# Patient Record
Sex: Female | Born: 1950
Health system: Southern US, Community
[De-identification: ages and names within clinical notes are randomized; demographics above are authoritative.]

## PROBLEM LIST (undated history)

## (undated) DIAGNOSIS — K219 Gastro-esophageal reflux disease without esophagitis: Secondary | ICD-10-CM

## (undated) DIAGNOSIS — K859 Acute pancreatitis without necrosis or infection, unspecified: Secondary | ICD-10-CM

## (undated) DIAGNOSIS — E119 Type 2 diabetes mellitus without complications: Secondary | ICD-10-CM

## (undated) DIAGNOSIS — M1990 Unspecified osteoarthritis, unspecified site: Secondary | ICD-10-CM

## (undated) DIAGNOSIS — E039 Hypothyroidism, unspecified: Secondary | ICD-10-CM

## (undated) DIAGNOSIS — E785 Hyperlipidemia, unspecified: Secondary | ICD-10-CM

## (undated) DIAGNOSIS — I10 Essential (primary) hypertension: Secondary | ICD-10-CM

## (undated) HISTORY — PX: KNEE SURGERY: SHX244

## (undated) HISTORY — DX: Hyperlipidemia, unspecified: E78.5

## (undated) HISTORY — PX: JOINT REPLACEMENT: SHX530

## (undated) HISTORY — DX: Type 2 diabetes mellitus without complications: E11.9

## (undated) HISTORY — PX: OTHER SURGICAL HISTORY: SHX169

## (undated) HISTORY — PX: THYROIDECTOMY: SHX17

## (undated) HISTORY — PX: ABDOMINAL HYSTERECTOMY: SHX81

---

## 1997-12-20 ENCOUNTER — Emergency Department (HOSPITAL_COMMUNITY): Admission: EM | Admit: 1997-12-20 | Discharge: 1997-12-20 | Payer: Self-pay | Admitting: Emergency Medicine

## 1998-05-26 ENCOUNTER — Encounter: Payer: Self-pay | Admitting: Specialist

## 1998-06-02 ENCOUNTER — Inpatient Hospital Stay (HOSPITAL_COMMUNITY): Admission: RE | Admit: 1998-06-02 | Discharge: 1998-06-06 | Payer: Self-pay | Admitting: Specialist

## 1998-06-08 ENCOUNTER — Encounter (HOSPITAL_COMMUNITY): Admission: RE | Admit: 1998-06-08 | Discharge: 1998-09-06 | Payer: Self-pay | Admitting: Specialist

## 1998-10-04 ENCOUNTER — Inpatient Hospital Stay (HOSPITAL_COMMUNITY): Admission: AD | Admit: 1998-10-04 | Discharge: 1998-10-07 | Payer: Self-pay | Admitting: Sports Medicine

## 1998-10-10 ENCOUNTER — Encounter: Admission: RE | Admit: 1998-10-10 | Discharge: 1998-10-10 | Payer: Self-pay | Admitting: Sports Medicine

## 1998-12-04 ENCOUNTER — Encounter: Admission: RE | Admit: 1998-12-04 | Discharge: 1998-12-04 | Payer: Self-pay | Admitting: Family Medicine

## 1999-02-13 ENCOUNTER — Emergency Department (HOSPITAL_COMMUNITY): Admission: EM | Admit: 1999-02-13 | Discharge: 1999-02-13 | Payer: Self-pay | Admitting: Emergency Medicine

## 1999-02-13 ENCOUNTER — Encounter: Payer: Self-pay | Admitting: Emergency Medicine

## 1999-04-15 ENCOUNTER — Emergency Department (HOSPITAL_COMMUNITY): Admission: EM | Admit: 1999-04-15 | Discharge: 1999-04-15 | Payer: Self-pay | Admitting: *Deleted

## 1999-04-24 ENCOUNTER — Ambulatory Visit (HOSPITAL_COMMUNITY): Admission: RE | Admit: 1999-04-24 | Discharge: 1999-04-24 | Payer: Self-pay | Admitting: Specialist

## 1999-04-24 ENCOUNTER — Encounter: Payer: Self-pay | Admitting: Specialist

## 1999-05-10 ENCOUNTER — Ambulatory Visit (HOSPITAL_COMMUNITY): Admission: RE | Admit: 1999-05-10 | Discharge: 1999-05-10 | Payer: Self-pay | Admitting: Orthopedic Surgery

## 1999-05-10 ENCOUNTER — Encounter: Payer: Self-pay | Admitting: Orthopedic Surgery

## 1999-06-12 ENCOUNTER — Ambulatory Visit (HOSPITAL_COMMUNITY): Admission: RE | Admit: 1999-06-12 | Discharge: 1999-06-12 | Payer: Self-pay | Admitting: Orthopedic Surgery

## 1999-06-12 ENCOUNTER — Encounter: Payer: Self-pay | Admitting: Orthopedic Surgery

## 1999-06-29 ENCOUNTER — Emergency Department (HOSPITAL_COMMUNITY): Admission: EM | Admit: 1999-06-29 | Discharge: 1999-06-29 | Payer: Self-pay | Admitting: Emergency Medicine

## 1999-06-29 ENCOUNTER — Encounter: Payer: Self-pay | Admitting: Emergency Medicine

## 1999-07-03 ENCOUNTER — Encounter: Payer: Self-pay | Admitting: Orthopedic Surgery

## 1999-07-03 ENCOUNTER — Other Ambulatory Visit: Admission: RE | Admit: 1999-07-03 | Discharge: 1999-07-03 | Payer: Self-pay | Admitting: Obstetrics and Gynecology

## 1999-07-03 ENCOUNTER — Ambulatory Visit (HOSPITAL_COMMUNITY): Admission: RE | Admit: 1999-07-03 | Discharge: 1999-07-03 | Payer: Self-pay | Admitting: Orthopedic Surgery

## 2000-04-10 ENCOUNTER — Encounter: Payer: Self-pay | Admitting: Emergency Medicine

## 2000-04-10 ENCOUNTER — Emergency Department (HOSPITAL_COMMUNITY): Admission: EM | Admit: 2000-04-10 | Discharge: 2000-04-10 | Payer: Self-pay | Admitting: Emergency Medicine

## 2000-05-26 ENCOUNTER — Emergency Department (HOSPITAL_COMMUNITY): Admission: EM | Admit: 2000-05-26 | Discharge: 2000-05-26 | Payer: Self-pay | Admitting: Internal Medicine

## 2000-06-27 ENCOUNTER — Emergency Department (HOSPITAL_COMMUNITY): Admission: EM | Admit: 2000-06-27 | Discharge: 2000-06-27 | Payer: Self-pay | Admitting: Emergency Medicine

## 2000-07-12 ENCOUNTER — Emergency Department (HOSPITAL_COMMUNITY): Admission: EM | Admit: 2000-07-12 | Discharge: 2000-07-12 | Payer: Self-pay | Admitting: Internal Medicine

## 2000-08-28 ENCOUNTER — Emergency Department (HOSPITAL_COMMUNITY): Admission: EM | Admit: 2000-08-28 | Discharge: 2000-08-28 | Payer: Self-pay | Admitting: Emergency Medicine

## 2000-08-28 ENCOUNTER — Encounter: Payer: Self-pay | Admitting: Emergency Medicine

## 2000-09-05 ENCOUNTER — Encounter: Payer: Self-pay | Admitting: Specialist

## 2000-09-05 ENCOUNTER — Ambulatory Visit (HOSPITAL_COMMUNITY): Admission: RE | Admit: 2000-09-05 | Discharge: 2000-09-05 | Payer: Self-pay | Admitting: Specialist

## 2000-11-18 ENCOUNTER — Emergency Department (HOSPITAL_COMMUNITY): Admission: EM | Admit: 2000-11-18 | Discharge: 2000-11-19 | Payer: Self-pay | Admitting: Emergency Medicine

## 2001-02-11 ENCOUNTER — Encounter: Payer: Self-pay | Admitting: Specialist

## 2001-02-19 ENCOUNTER — Inpatient Hospital Stay (HOSPITAL_COMMUNITY): Admission: RE | Admit: 2001-02-19 | Discharge: 2001-02-22 | Payer: Self-pay | Admitting: Specialist

## 2001-02-19 ENCOUNTER — Encounter (INDEPENDENT_AMBULATORY_CARE_PROVIDER_SITE_OTHER): Payer: Self-pay | Admitting: *Deleted

## 2001-05-07 ENCOUNTER — Encounter: Admission: RE | Admit: 2001-05-07 | Discharge: 2001-05-07 | Payer: Self-pay | Admitting: Family Medicine

## 2001-07-20 ENCOUNTER — Encounter: Payer: Self-pay | Admitting: Emergency Medicine

## 2001-07-20 ENCOUNTER — Emergency Department (HOSPITAL_COMMUNITY): Admission: EM | Admit: 2001-07-20 | Discharge: 2001-07-20 | Payer: Self-pay | Admitting: Emergency Medicine

## 2001-07-22 ENCOUNTER — Emergency Department (HOSPITAL_COMMUNITY): Admission: EM | Admit: 2001-07-22 | Discharge: 2001-07-22 | Payer: Self-pay | Admitting: *Deleted

## 2001-10-01 ENCOUNTER — Encounter: Payer: Self-pay | Admitting: Specialist

## 2001-10-06 ENCOUNTER — Inpatient Hospital Stay (HOSPITAL_COMMUNITY): Admission: RE | Admit: 2001-10-06 | Discharge: 2001-10-09 | Payer: Self-pay | Admitting: Specialist

## 2001-10-06 ENCOUNTER — Encounter (INDEPENDENT_AMBULATORY_CARE_PROVIDER_SITE_OTHER): Payer: Self-pay | Admitting: *Deleted

## 2001-10-11 ENCOUNTER — Emergency Department (HOSPITAL_COMMUNITY): Admission: EM | Admit: 2001-10-11 | Discharge: 2001-10-11 | Payer: Self-pay | Admitting: Emergency Medicine

## 2001-11-05 ENCOUNTER — Inpatient Hospital Stay (HOSPITAL_COMMUNITY): Admission: RE | Admit: 2001-11-05 | Discharge: 2001-11-10 | Payer: Self-pay | Admitting: Specialist

## 2002-05-24 ENCOUNTER — Inpatient Hospital Stay (HOSPITAL_COMMUNITY): Admission: RE | Admit: 2002-05-24 | Discharge: 2002-05-27 | Payer: Self-pay | Admitting: Specialist

## 2002-06-08 ENCOUNTER — Encounter: Admission: RE | Admit: 2002-06-08 | Discharge: 2002-07-07 | Payer: Self-pay | Admitting: Specialist

## 2002-11-28 ENCOUNTER — Emergency Department (HOSPITAL_COMMUNITY): Admission: EM | Admit: 2002-11-28 | Discharge: 2002-11-28 | Payer: Self-pay | Admitting: *Deleted

## 2003-03-22 ENCOUNTER — Ambulatory Visit (HOSPITAL_COMMUNITY): Admission: RE | Admit: 2003-03-22 | Discharge: 2003-03-22 | Payer: Self-pay | Admitting: Internal Medicine

## 2003-05-15 ENCOUNTER — Emergency Department (HOSPITAL_COMMUNITY): Admission: AD | Admit: 2003-05-15 | Discharge: 2003-05-15 | Payer: Self-pay | Admitting: Family Medicine

## 2003-05-15 ENCOUNTER — Encounter: Payer: Self-pay | Admitting: Family Medicine

## 2003-08-26 ENCOUNTER — Other Ambulatory Visit: Admission: RE | Admit: 2003-08-26 | Discharge: 2003-08-26 | Payer: Self-pay | Admitting: Internal Medicine

## 2003-10-10 ENCOUNTER — Emergency Department (HOSPITAL_COMMUNITY): Admission: EM | Admit: 2003-10-10 | Discharge: 2003-10-10 | Payer: Self-pay | Admitting: Family Medicine

## 2003-12-11 ENCOUNTER — Emergency Department (HOSPITAL_COMMUNITY): Admission: EM | Admit: 2003-12-11 | Discharge: 2003-12-11 | Payer: Self-pay | Admitting: Family Medicine

## 2003-12-15 ENCOUNTER — Encounter: Admission: RE | Admit: 2003-12-15 | Discharge: 2003-12-15 | Payer: Self-pay | Admitting: Internal Medicine

## 2003-12-19 ENCOUNTER — Other Ambulatory Visit: Admission: RE | Admit: 2003-12-19 | Discharge: 2003-12-19 | Payer: Self-pay | Admitting: Diagnostic Radiology

## 2004-02-07 ENCOUNTER — Encounter (HOSPITAL_COMMUNITY): Admission: RE | Admit: 2004-02-07 | Discharge: 2004-05-07 | Payer: Self-pay | Admitting: Internal Medicine

## 2004-11-21 ENCOUNTER — Encounter (INDEPENDENT_AMBULATORY_CARE_PROVIDER_SITE_OTHER): Payer: Self-pay | Admitting: Specialist

## 2004-11-22 ENCOUNTER — Inpatient Hospital Stay (HOSPITAL_COMMUNITY): Admission: RE | Admit: 2004-11-22 | Discharge: 2004-11-23 | Payer: Self-pay | Admitting: General Surgery

## 2005-02-19 ENCOUNTER — Encounter: Admission: RE | Admit: 2005-02-19 | Discharge: 2005-02-19 | Payer: Self-pay | Admitting: Orthopedic Surgery

## 2005-05-24 ENCOUNTER — Encounter: Admission: RE | Admit: 2005-05-24 | Discharge: 2005-05-24 | Payer: Self-pay | Admitting: Internal Medicine

## 2005-05-31 ENCOUNTER — Encounter: Admission: RE | Admit: 2005-05-31 | Discharge: 2005-05-31 | Payer: Self-pay | Admitting: Internal Medicine

## 2005-09-14 ENCOUNTER — Emergency Department (HOSPITAL_COMMUNITY): Admission: EM | Admit: 2005-09-14 | Discharge: 2005-09-14 | Payer: Self-pay | Admitting: Emergency Medicine

## 2005-11-27 ENCOUNTER — Encounter: Admission: RE | Admit: 2005-11-27 | Discharge: 2005-11-27 | Payer: Self-pay | Admitting: Orthopedic Surgery

## 2006-03-05 ENCOUNTER — Inpatient Hospital Stay (HOSPITAL_COMMUNITY): Admission: RE | Admit: 2006-03-05 | Discharge: 2006-03-08 | Payer: Self-pay | Admitting: Orthopedic Surgery

## 2006-05-05 ENCOUNTER — Encounter: Admission: RE | Admit: 2006-05-05 | Discharge: 2006-05-05 | Payer: Self-pay | Admitting: Internal Medicine

## 2006-05-10 ENCOUNTER — Emergency Department (HOSPITAL_COMMUNITY): Admission: EM | Admit: 2006-05-10 | Discharge: 2006-05-10 | Payer: Self-pay | Admitting: Family Medicine

## 2006-09-19 ENCOUNTER — Encounter: Admission: RE | Admit: 2006-09-19 | Discharge: 2006-09-19 | Payer: Self-pay | Admitting: Geriatric Medicine

## 2006-10-12 ENCOUNTER — Encounter: Admission: RE | Admit: 2006-10-12 | Discharge: 2006-10-12 | Payer: Self-pay | Admitting: Internal Medicine

## 2006-10-23 ENCOUNTER — Ambulatory Visit (HOSPITAL_COMMUNITY): Admission: RE | Admit: 2006-10-23 | Discharge: 2006-10-23 | Payer: Self-pay | Admitting: Gastroenterology

## 2006-12-26 ENCOUNTER — Encounter: Admission: RE | Admit: 2006-12-26 | Discharge: 2006-12-26 | Payer: Self-pay | Admitting: Gastroenterology

## 2007-08-06 HISTORY — PX: CHOLECYSTECTOMY: SHX55

## 2007-08-19 ENCOUNTER — Encounter
Admission: RE | Admit: 2007-08-19 | Discharge: 2007-11-17 | Payer: Self-pay | Admitting: Physical Medicine & Rehabilitation

## 2007-08-19 ENCOUNTER — Ambulatory Visit: Payer: Self-pay | Admitting: Physical Medicine & Rehabilitation

## 2007-08-27 ENCOUNTER — Encounter
Admission: RE | Admit: 2007-08-27 | Discharge: 2007-10-08 | Payer: Self-pay | Admitting: Physical Medicine & Rehabilitation

## 2007-09-05 ENCOUNTER — Encounter (INDEPENDENT_AMBULATORY_CARE_PROVIDER_SITE_OTHER): Payer: Self-pay | Admitting: Surgery

## 2007-09-05 ENCOUNTER — Inpatient Hospital Stay (HOSPITAL_COMMUNITY): Admission: EM | Admit: 2007-09-05 | Discharge: 2007-09-06 | Payer: Self-pay | Admitting: Emergency Medicine

## 2007-09-07 ENCOUNTER — Encounter: Admission: RE | Admit: 2007-09-07 | Discharge: 2007-09-07 | Payer: Self-pay | Admitting: General Surgery

## 2007-09-08 ENCOUNTER — Encounter: Admission: RE | Admit: 2007-09-08 | Discharge: 2007-09-08 | Payer: Self-pay | Admitting: Gastroenterology

## 2007-09-11 ENCOUNTER — Ambulatory Visit (HOSPITAL_COMMUNITY): Admission: RE | Admit: 2007-09-11 | Discharge: 2007-09-11 | Payer: Self-pay | Admitting: Gastroenterology

## 2007-09-22 ENCOUNTER — Encounter: Admission: RE | Admit: 2007-09-22 | Discharge: 2007-09-22 | Payer: Self-pay | Admitting: Orthopedic Surgery

## 2007-10-13 ENCOUNTER — Ambulatory Visit: Payer: Self-pay | Admitting: Physical Medicine & Rehabilitation

## 2007-11-04 ENCOUNTER — Ambulatory Visit (HOSPITAL_BASED_OUTPATIENT_CLINIC_OR_DEPARTMENT_OTHER): Admission: RE | Admit: 2007-11-04 | Discharge: 2007-11-04 | Payer: Self-pay | Admitting: Orthopedic Surgery

## 2007-12-16 ENCOUNTER — Encounter
Admission: RE | Admit: 2007-12-16 | Discharge: 2008-03-15 | Payer: Self-pay | Admitting: Physical Medicine & Rehabilitation

## 2008-01-21 ENCOUNTER — Ambulatory Visit: Payer: Self-pay | Admitting: Physical Medicine & Rehabilitation

## 2008-03-03 ENCOUNTER — Ambulatory Visit: Payer: Self-pay | Admitting: Physical Medicine & Rehabilitation

## 2008-03-30 ENCOUNTER — Encounter
Admission: RE | Admit: 2008-03-30 | Discharge: 2008-06-28 | Payer: Self-pay | Admitting: Physical Medicine & Rehabilitation

## 2008-03-31 ENCOUNTER — Ambulatory Visit: Payer: Self-pay | Admitting: Physical Medicine & Rehabilitation

## 2008-05-02 ENCOUNTER — Ambulatory Visit: Payer: Self-pay | Admitting: Physical Medicine & Rehabilitation

## 2008-06-06 ENCOUNTER — Ambulatory Visit: Payer: Self-pay | Admitting: Physical Medicine & Rehabilitation

## 2008-06-14 ENCOUNTER — Encounter
Admission: RE | Admit: 2008-06-14 | Discharge: 2008-09-12 | Payer: Self-pay | Admitting: Physical Medicine & Rehabilitation

## 2008-07-04 ENCOUNTER — Encounter
Admission: RE | Admit: 2008-07-04 | Discharge: 2008-07-27 | Payer: Self-pay | Admitting: Physical Medicine & Rehabilitation

## 2008-07-05 ENCOUNTER — Ambulatory Visit: Payer: Self-pay | Admitting: Physical Medicine & Rehabilitation

## 2008-08-02 ENCOUNTER — Encounter
Admission: RE | Admit: 2008-08-02 | Discharge: 2008-10-31 | Payer: Self-pay | Admitting: Physical Medicine & Rehabilitation

## 2008-08-02 ENCOUNTER — Ambulatory Visit: Payer: Self-pay | Admitting: Physical Medicine & Rehabilitation

## 2008-08-31 ENCOUNTER — Ambulatory Visit: Payer: Self-pay | Admitting: Physical Medicine & Rehabilitation

## 2008-09-27 ENCOUNTER — Ambulatory Visit: Payer: Self-pay | Admitting: Physical Medicine & Rehabilitation

## 2008-10-14 ENCOUNTER — Encounter: Admission: RE | Admit: 2008-10-14 | Discharge: 2008-10-14 | Payer: Self-pay | Admitting: Internal Medicine

## 2008-10-26 ENCOUNTER — Ambulatory Visit: Payer: Self-pay | Admitting: Physical Medicine & Rehabilitation

## 2008-10-27 ENCOUNTER — Encounter: Admission: RE | Admit: 2008-10-27 | Discharge: 2008-10-27 | Payer: Self-pay | Admitting: Neurosurgery

## 2008-11-22 ENCOUNTER — Encounter
Admission: RE | Admit: 2008-11-22 | Discharge: 2009-02-20 | Payer: Self-pay | Admitting: Physical Medicine & Rehabilitation

## 2008-11-25 ENCOUNTER — Ambulatory Visit: Payer: Self-pay | Admitting: Physical Medicine & Rehabilitation

## 2008-12-20 ENCOUNTER — Ambulatory Visit: Payer: Self-pay | Admitting: Physical Medicine & Rehabilitation

## 2009-01-20 ENCOUNTER — Encounter: Admission: RE | Admit: 2009-01-20 | Discharge: 2009-01-20 | Payer: Self-pay | Admitting: Orthopedic Surgery

## 2009-02-01 ENCOUNTER — Ambulatory Visit: Payer: Self-pay | Admitting: Physical Medicine & Rehabilitation

## 2009-02-24 ENCOUNTER — Emergency Department (HOSPITAL_COMMUNITY): Admission: EM | Admit: 2009-02-24 | Discharge: 2009-02-24 | Payer: Self-pay | Admitting: Emergency Medicine

## 2009-03-01 ENCOUNTER — Encounter
Admission: RE | Admit: 2009-03-01 | Discharge: 2009-05-30 | Payer: Self-pay | Admitting: Physical Medicine & Rehabilitation

## 2009-03-02 ENCOUNTER — Ambulatory Visit: Payer: Self-pay | Admitting: Physical Medicine & Rehabilitation

## 2009-04-13 ENCOUNTER — Inpatient Hospital Stay (HOSPITAL_COMMUNITY): Admission: RE | Admit: 2009-04-13 | Discharge: 2009-04-15 | Payer: Self-pay | Admitting: Orthopedic Surgery

## 2009-06-07 ENCOUNTER — Encounter: Admission: RE | Admit: 2009-06-07 | Discharge: 2009-07-04 | Payer: Self-pay | Admitting: Orthopedic Surgery

## 2009-08-05 HISTORY — PX: BACK SURGERY: SHX140

## 2010-04-16 ENCOUNTER — Inpatient Hospital Stay (HOSPITAL_COMMUNITY): Admission: RE | Admit: 2010-04-16 | Discharge: 2010-04-19 | Payer: Self-pay | Admitting: Orthopedic Surgery

## 2010-08-25 ENCOUNTER — Encounter: Payer: Self-pay | Admitting: Orthopedic Surgery

## 2010-08-26 ENCOUNTER — Encounter: Payer: Self-pay | Admitting: Gastroenterology

## 2010-08-26 ENCOUNTER — Encounter: Payer: Self-pay | Admitting: Internal Medicine

## 2010-10-18 LAB — COMPREHENSIVE METABOLIC PANEL
ALT: 14 U/L (ref 0–35)
AST: 16 U/L (ref 0–37)
Albumin: 4.2 g/dL (ref 3.5–5.2)
Chloride: 101 mEq/L (ref 96–112)
Creatinine, Ser: 0.81 mg/dL (ref 0.4–1.2)
GFR calc Af Amer: >60 mL/min (ref >60)
Sodium: 139 mEq/L (ref 135–145)
Total Bilirubin: 0.4 mg/dL (ref 0.3–1.2)

## 2010-10-18 LAB — CBC
Hemoglobin: 8.8 g/dL — ABNORMAL LOW (ref 12.0–15.0)
Hemoglobin: 13.9 g/dL (ref 12.0–15.0)
Hemoglobin: 9.2 g/dL — ABNORMAL LOW (ref 12.0–15.0)
MCH: 29.2 pg (ref 26.0–34.0)
MCH: 29.1 pg (ref 26.0–34.0)
MCH: 29.2 pg (ref 26.0–34.0)
MCH: 29.2 pg (ref 26.0–34.0)
MCHC: 33.9 g/dL (ref 30.0–36.0)
MCHC: 34.1 g/dL (ref 30.0–36.0)
MCV: 85.8 fL (ref 78.0–100.0)
Platelets: 196 10*3/uL (ref 150–400)
Platelets: 216 10*3/uL (ref 150–400)
RBC: 3.02 MIL/uL — ABNORMAL LOW (ref 3.87–5.11)
RBC: 3.45 MIL/uL — ABNORMAL LOW (ref 3.87–5.11)
RBC: 4.76 MIL/uL (ref 3.87–5.11)
RDW: 15.6 % — ABNORMAL HIGH (ref 11.5–15.5)
WBC: 6.7 10*3/uL (ref 4.0–10.5)
WBC: 12 10*3/uL — ABNORMAL HIGH (ref 4.0–10.5)

## 2010-10-18 LAB — APTT: aPTT: 34 seconds (ref 24–37)

## 2010-10-18 LAB — URINALYSIS, ROUTINE W REFLEX MICROSCOPIC
Protein, ur: NEGATIVE mg/dL
Specific Gravity, Urine: 1.022 (ref 1.005–1.030)
Urobilinogen, UA: 0.2 mg/dL (ref 0.0–1.0)

## 2010-10-18 LAB — BASIC METABOLIC PANEL
BUN: 5 mg/dL — ABNORMAL LOW (ref 6–23)
CO2: 28 mEq/L (ref 19–32)
CO2: 30 mEq/L (ref 19–32)
Calcium: 7.6 mg/dL — ABNORMAL LOW (ref 8.4–10.5)
Chloride: 103 mEq/L (ref 96–112)
Creatinine, Ser: 0.65 mg/dL (ref 0.4–1.2)
Glucose, Bld: 116 mg/dL — ABNORMAL HIGH (ref 70–99)
Glucose, Bld: 125 mg/dL — ABNORMAL HIGH (ref 70–99)
Sodium: 136 mEq/L (ref 135–145)

## 2010-10-18 LAB — TYPE AND SCREEN: ABO/RH(D): A POS

## 2010-10-18 LAB — PROTIME-INR
INR: 1.86 — ABNORMAL HIGH (ref 0.00–1.49)
Prothrombin Time: 21.6 seconds — ABNORMAL HIGH (ref 11.6–15.2)
Prothrombin Time: 21 seconds — ABNORMAL HIGH (ref 11.6–15.2)

## 2010-10-18 LAB — SURGICAL PCR SCREEN: Staphylococcus aureus: POSITIVE — AB

## 2010-11-09 LAB — BASIC METABOLIC PANEL
BUN: 11 mg/dL (ref 6–23)
Chloride: 104 mEq/L (ref 96–112)
GFR calc non Af Amer: >60 mL/min (ref >60)
Glucose, Bld: 81 mg/dL (ref 70–99)
Potassium: 3.6 mEq/L (ref 3.5–5.1)
Sodium: 140 mEq/L (ref 135–145)

## 2010-11-09 LAB — TYPE AND SCREEN
ABO/RH(D): A POS
Donor AG Type: NEGATIVE

## 2010-11-09 LAB — CBC
HCT: 39.6 % (ref 36.0–46.0)
Hemoglobin: 13.3 g/dL (ref 12.0–15.0)
MCV: 85.9 fL (ref 78.0–100.0)
Platelets: 296 10*3/uL (ref 150–400)
RDW: 14.8 % (ref 11.5–15.5)
WBC: 7.9 10*3/uL (ref 4.0–10.5)

## 2010-12-18 NOTE — Assessment & Plan Note (Signed)
A 60 year old female with chronic postoperative pain following a right  total knee replacement.  She has been functioning at a reasonable level.  She has finished up with physical therapy.  She has been doing some knee  extension exercises.  She has had no falls.  She sees Dr. Lequita Halt  sometime in the next 4 months for recheck.  She does note some  increasing pain to the left knee.   She has some chronic back pain as well as lumbar spondylosis.  Her new  complaint is snapping pain in the right thumb.   Her pain level is graded at 7, but interferes with activity at a 6.  Her  Oswestry score is 46%.  She continues to drive.  She is independent with  all other self-care skills and ADLs.  Pain medication includes oxycodone  5/325 one p.o. t.i.d.  She has had no signs of abuse or aberrant drug  behavior.   Blood pressure is 109/74, pulse 87, respirations 18, and O2 100% on room  air.  A well-developed obese female in no acute distress.  Orientation  x3.  Affect alert.  Gait is with a limp favoring the right lower  extremity.  Extremities show no evidence of edema.   She has no evidence of effusion.  Wound is well healed surgical scar  midline right knee.   Deep tendon reflexes are mildly reduced, right knee compared to left,  equal at the ankles.   Her back has mild tenderness to palpation in the lumbar paraspinals.  She has no signs of scoliosis on inspection of the lumbar spine or  thoracic spine.   She has good hip internal and external rotation.  She has no tenderness  around the patella, but pain around the joint line right greater than  left side.   IMPRESSION:  1. Chronic postoperative knee pain, right lower extremity.  2. Left lower extremity increasing knee pain.  I suspect some      developing osteoarthritis.  3. Right thumb pain.  Examination of her hand showed no evidence of      joint swelling.  She has no tenderness to palpation over the      interphalangeal or  metacarpophalangeal.  She does have audible      snapping or clicking at the proximal interphalangeal joint of the      right hand with repeating some flexion/extension.  No      hypersensitivity to touch.  No numbness in the hand.   In terms of thumb pain, she does have snapping thumb with pain.  We will  ask her to pain at the interphalangeal joint.  No evidence of effusion.  We will ask her to see a Hand Surgery in regards to this.   We will continue her current pain medicines.  I will see her back in  about 3 months, nursing visits every month x2 in between.      Erick Colace, M.D.  Electronically Signed     AEK/MedQ  D:  09/27/2008 13:21:47  T:  09/28/2008 01:51:43  Job #:  [id number]   cc:   Ollen Gross, M.D.  Fax: [phone]   Madelynn Done, MD  Fax: [phone]

## 2010-12-18 NOTE — Assessment & Plan Note (Signed)
Tina Neal follows up today for chronic low back pain as well as  chronic right postoperative knee pain in the interval time.  She has in  the interval time period undergone bone scan of her lower extremities  demonstrating some increased uptake in the right knee femoral and tibial  components.  She is now scheduled for what sounds like an arthroscopic  surgery with Dr. Antony Odea.  She has had no new medical complications in the  interval time.  She also has right shoulder pain.  She has had previous  problems with this.  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   Her pain is rated 7/10 mainly in her low back as well as her right  greater than left knee area.  She does have some radiating pain further  into the right foot and ankle greater than the left.  Her sleep is fair.  She is not employed.   CURRENT PAIN MEDICATION:  Oxycodone 5/325 one p.o. t.i.d. or q.i.d.  She  had 60 prescribed three weeks ago and actually has 26 left.   Non-pain medications include Levoxyl, Nexium, Diazide, Singulair,  HyoMax.   EXAMINATION:  GENERAL:  No acute distress.  Mood and affect appropriate.  His blood pressure is 103/75, pulse 92, respirations 18.  O2 saturation  100% on room air.   Patient has crepitus right knee, no warmth, no effusion.  She has no  calf tenderness to palpation bilaterally, no lower extremity edema.  Her  upper strength is 5/5 deltoids, biceps, triceps, grip.  Lower extremity  strength is 5/5 hip flexor, knee extensor, ankle dorsiflexor.  She has  tenderness to palpation in the lumbar spine, which increases with  extension.  She has normal deep tendon reflexes in the lower extremities  with exception of right knee, which has 1+ reflex, some pain inhibition.  Her shoulder exam demonstrates positive impingement  sign at 90 degrees  of abduction, no pain with crossed adduction of the shoulder.  She has  no swelling in the shoulder, no tenderness to palpation in the  parascapular musculature.   IMPRESSION:  1. Lumbar facet syndrome.  She would benefit from medial branch      blocks, but will first undergo some knee arthroscopy, which will be      within the next month.  2. Right knee pain secondary to chronic postoperative pain.  Question      loosening versus infection.  Noted on nuclear medicine bone scan,      no signs of systemic infection or effusion of the knee at the      current time.  3. Right subacromial bursitis.  May benefit from repeat shoulder      injection.   PLAN:  I have reviewed her urine drug screen.  It is appropriate for the  medications repotted. No illicit drugs noted.  We will write Percocet  5/325 one p.o. t.i.d., and this is a one month's supply.  She can fill  this next week since she still has some medications left.  In the  postoperative period, Dr. Antony Odea can prescribe whatever postoperative  medications are needed.  He may need to go up to Percocet 7.5 in the  postoperative period for about a month.  I can  resume her pain management one month postoperatively should everything  go  well.  1. She may benefit from a trial of Voltaren gel to the knee.      Erick Colace, M.D.  Electronically Signed     AEK/MedQ  D:  10/13/2007 08:31:42  T:  10/13/2007 13:54:21  Job #:  [id number]   cc:   Candyce Churn, M.D.  Fax: [phone]   De Hollingshead, Dr.

## 2010-12-18 NOTE — Assessment & Plan Note (Signed)
Tina Neal is a 60 year old female.  She has a history of right total  knee replacement.  She has had chronic postoperative pain as a result.  She has been maintained on narcotic analgesics.  In addition, she has  low back pain, which has had relief with medial branch block in  September.  She really did not want to do a confirmatory block.  She  wanted to go through physical therapy and is starting to do this.  Physical Therapy is helping her right knee.  She feels more than her  back at this point.   Her Oswestry disability index score was 34%, which is improved compared  to 32% last visit.   PHYSICAL EXAMINATION:  GENERAL:  In no acute distress.  Mood and affect  appropriate.  She has good knee flexion and extension.  She has some  crepitus right knee and none on the left knee.  No evidence of knee  effusion bilaterally.  BACK:  Her back has some minor tenderness to palpation of paraspinals  bilaterally.  EXTREMITIES:  Her lower extremity strength is normal.  Gait does favor  the right lower extremity, otherwise no toe drag or knee instability.   IMPRESSION:  1. Chronic postoperative knee pain.  2. Left knee likely early osteoarthritis.  3. Lumbosacral disc disorder with chronic low back pain, may have some      facet syndrome as well.   PLAN:  1. We will go ahead and continue her PT.  2. I will see her back in 1 month, consider repeat medial branch      blocks to further necessity of radiofrequency neurotomy.  3. Continue oxycodone 5/325 t.i.d.  No signs of abuse or aberrant drug      behavior.      Erick Colace, M.D.  Electronically Signed     AEK/MedQ  D:  07/05/2008 09:33:43  T:  07/05/2008 21:47:45  Job #:  [id number]   cc:   Ollen Gross, M.D.  Fax: [phone]   Annitta Needs

## 2010-12-18 NOTE — H&P (Signed)
NAME:  Tina Neal, Tina Neal               ACCOUNT NO.:  [id number]   MEDICAL RECORD NO.:  [id number]          PATIENT TYPE:  INP   LOCATION:  5121                         FACILITY:  [facility]   PHYSICIAN:  Gabrielle Dare. Janee Morn, M.D.DATE OF BIRTH:  [birth year]   DATE OF ADMISSION:  09/04/2007  DATE OF DISCHARGE:                              HISTORY & PHYSICAL   CHIEF COMPLAINT:  Right upper quadrant abdominal pain.   HISTORY OF PRESENT ILLNESS:  The patient is a 60 year old African  American female with 24-hour history of right upper quadrant pain  extending through to her back.  She had some mild associated nausea  initially, but this has resolved.  The patient has had episodic attacks  over the past several months, and actually had an appointment coming up  this week with Dr. Charlott Rakes at [facility] for further evaluation.  She came to the emergency department today due to the pain.  An  ultrasound showed gallstones and Murphy's sign consistent with  cholecystitis.  She also has mildly dilated common bile duct.   PAST MEDICAL HISTORY:  1. Asthma.  2. Hypothyroidism.   PAST SURGICAL HISTORY:  1. Thyroidectomy by Dr. Derrell Lolling.  2. Cesarean section.  3. Partial hysterectomy.  4. Right knee replacement by Dr. Lequita Halt.  5. Right rotator cuff repair by Dr. Rennis Chris.   FAMILY HISTORY:  She denies.   SOCIAL HISTORY:  She smokes cigarettes.  She occasionally drinks  alcohol.  She is on disability due to her orthopedic problems.   ALLERGIES:  NO KNOWN DRUG ALLERGIES.   MEDICATIONS:  1. Azmacort 100 mcg 2 puffs b.i.d.  2. Combivent 2 puffs t.i.d.  3. Levoxyl 88 mcg daily.  4. Nexium 40 mg daily.  5. Triamterene with hydrochlorothiazide 37.5 with 25 mg daily.   REVIEW OF SYSTEMS:  GI:  As above.  GENERAL:  Negative.  CARDIAC:  Negative.  PULMONARY:  Negative, with no acute asthma symptoms recently.  GU: Negative.  NEUROPSYCHOLOGIC:  Negative.  Remainder of the review of  systems was  unremarkable.   PHYSICAL EXAMINATION:  Temperature 97, pulse 74, respirations 18, blood  pressure 124/83.  GENERAL:  She is awake and alert.  She is in no distress.  HEENT:  Pupils are equal.  Oral mucosa is moist.  Sclerae has no  significant icterus.  NECK:  Supple.  She has a scar anteriorly from her thyroid surgery.  PULMONARY EXAM:  Lungs are clear to auscultation.  There was no  significant wheezing present.  Respiratory effort is good.  CARDIOVASCULAR EXAM:  Heart is regular.  Normal S1 and S2 with no  murmurs heard, and pulses palpable in the left chest.  Distal pulses are  2+ throughout.  ABDOMEN:  Tender in right upper quadrant with no guarding.  No masses  are palpated.  No organomegaly is noted.  Bowel sounds are present.  She  has a lower midline scar from her previous hysterectomy and cesarean  section.  SKIN:  Warm and dry.  No rashes are present.  NEUROLOGIC:  She is awake and alert.  She is oriented  x3.  No gross  focal deficits were noted.  EXTREMITIES:  Have no deformity or  tenderness.   LABORATORY STUDIES:  White blood cell count 8.2, hemoglobin 12.3.  Basic  metabolic panel is within normal limits.  AST 137, ALT 79, alkaline  phosphatase 200, bilirubin 0.7.   IMPRESSION:  A 60 year old [place] American female with acute  cholecystitis   PLAN:  Will admit.  Will place her on IV antibiotics and we will plan  laparoscopic cholecystectomy with intraoperative cholangiogram later  today.  I will discuss this case in detail with Dr. Daphine Deutscher, who is my  partner working later today.  The plan will be discussed in detail with  the patient.      Gabrielle Dare Janee Morn, M.D.  Electronically Signed     BET/MEDQ  D:  09/05/2007  T:  09/05/2007  Job:  [id number]   cc:   Shirley Friar, MD

## 2010-12-18 NOTE — Op Note (Signed)
NAME:  Tina Neal, Tina Neal               ACCOUNT NO.:  [id number]   MEDICAL RECORD NO.:  [id number]          PATIENT TYPE:  AMB   LOCATION:  ENDO                         FACILITY:  [facility]   PHYSICIAN:  Petra Kuba, M.D.    DATE OF BIRTH:  May 26, 1951   DATE OF PROCEDURE:  09/11/2007  DATE OF DISCHARGE:                               OPERATIVE REPORT   PROCEDURE:  ERCP, sphincterotomy and balloon pull-through.   INDICATIONS:  Patient with probable CBD stone with persistent pain and  increased liver tests.  Consent was signed after risks, benefits,  methods, options were thoroughly discussed in the office after ERCP  video. She also did have an intraop cholangiogram with bubbles versus  stones.  Consent was signed as above.   MEDICINES USED:  Fentanyl 150 mcg, Versed 15 mg.   PROCEDURE:  A side-viewing therapeutic video duodenoscope was inserted  by indirect vision into the stomach and advanced by direct visualization  into the duodenum. A normal-appearing ampulla was brought into view.  Using the triple-lumen sphincterotome loaded with the Jag wire, deep  selective cannulation was obtained.  There were no PD injections nor any  wire advancements towards the pancreas.  The CBD was tapered possibly a  slight distal stricture and slightly dilated. Possibly we saw a small  stone on the initial cholangiogram. The wire was advanced into the  intrahepatics. A moderate-sized sphincterotomy was made in the customary  fashion until we had adequate biliary drainage and could get the fully  bowed sphincterotome easily in and out of the duct. We then exchanged  the sphincterotome for the adjustable 12-15 mm balloon and proceeded  with three balloon pull throughs at 12 mm.  No stones, sludge or debris  was seen. There was very minimal resistance in withdrawing through the  ampulla. The wire actually fell out on all the balloon pull throughs but  were easily able to cannulate using the balloon catheter.  At this  junction, we went ahead and proceeded with an occlusion cholangiogram  which was normal. It was done in the customary fashion and the balloon  was withdrawn through the ampulla.  There was some sluggish drainage but  she did have some probably due to edema and spasm.  We elected to stop  the procedure in this junction.  The scope was removed.  The patient  tolerated the procedure well.  There was no obvious immediate  complication.   ENDOSCOPIC DIAGNOSES:  1. Normal ampulla.  2. No pancreatic duct injections or wire advancements towards the      pancreas.  3. Slight dilated common bile duct with a tapered distal duct,      questionable one small stones seen on initial cholangiogram.  4. Status post moderate-sized sphincterotomy and three 12 mm balloon      pull throughs without much resistance.  5. Negative occlusion cholangiogram at the end of the procedure.  6. Some sluggish drainage at the end of the procedure probably due to      spasm and edema.   PLAN:  Observe for delayed complications.  Follow-up p.r.n.  or in 1-2  weeks in the office to recheck symptoms, liver tests and make sure no  further workup plans are needed. In the  meantime, no aspirin or  nonsteroidals for 2 weeks.           ______________________________  Petra Kuba, M.D.     MEM/MEDQ  D:  09/11/2007  T:  09/12/2007  Job:  [id number]   cc:   Candyce Churn, M.D.  Fax: [phone]   Shirley Friar, MD  Fax: [phone]   [place]. Daphine Deutscher, MD  [address]  [place]  [place] [address]

## 2010-12-18 NOTE — Procedures (Signed)
NAME:  INNOCENCE, SCHLOTZHAUER               ACCOUNT NO.:  [id number]   MEDICAL RECORD NO.:  [id number]          PATIENT TYPE:  REC   LOCATION:  TPC                          FACILITY:  [facility]   PHYSICIAN:  Erick Colace, M.D.DATE OF BIRTH:  [birth year]   DATE OF PROCEDURE:  06/06/2008  DATE OF DISCHARGE:                               OPERATIVE REPORT   Ms. Masi follows up today.  She underwent a right L5 dorsal ramus  injection, right L4 medial branch block, right L3 medial branch block  under fluoroscopic guidance with improvements of preinjection pain level  of 7/10 to postinjection pain level 3/10.  This persisted for a day or 2  and then her pain seemed to intensify once again.  She gives her pain  about a 6/10.  Currently, she has started taking her oxycodone more  frequently for while and now has had to cut down to make her 57-month  supply last twice a day.  Her pain prior to the procedure was 7/10 last  visit.  She has not undergone any physical therapy recently.  Her sleep  is fair.  Her pain increases with bending and sitting.  Her relief from  meds is fair.  She does not climb steps but she does drive.  She does  have orthopedic issues in regards to her knee.  In addition, she has had  a left shoulder injection by Dr. Rennis Chris.   PAST MEDICAL HISTORY:  Hyperthyroidism, and high blood pressure.   HABITS:  Include smoking a pack a day.   FAMILY HISTORY:  Heart disease, diabetes, and high blood pressure.   PHYSICAL EXAMINATION:  VITAL SIGNS:  Blood pressure 100/68, pulse 71,  respirations 18, O2 sat 97% on room air.  GENERAL:  Overweight female in no acute distress, orientation x3, affect  is alert.  Gait is normal.  EXTREMITIES:  Without edema.  She has tenderness to palpation bilateral  lumbar paraspinal.  She has pain with forward flexion as well as  hyperextension; however hyperextension is more limited at 0-25% of  normal range were the forward flexion is 50% range.   Her lower extremity strength is normal.  Deep tendon reflexes are normal  in lower extremity.  Lower extremity range of motion normal.   IMPRESSION:  Lumbosacral spondylosis without myelopathy.  She has  history of lumbosacral disk disorder as well.  She has had reduction of  pain following medial branch blocks times 1.  She was originally  scheduled to reblock today to confirm results; however, she would like  to try something else prior to having another injection.   PLAN:  1. We will go ahead and start some physical therapy fitness focus      program up.  Avoid hyperextension, 4 visits total followed by a      home exercise program.  2. Continue oxycodone 5/325 t.i.d. basis #90 written for 1 month      supply.   Oswestry disability index is stable today 52% versus 57% prior visit.  I  will see her back in 1 month.  Erick Colace, M.D.  Electronically Signed     AEK/MEDQ  D:  06/06/2008 08:51:47  T:  06/06/2008 11:44:53  Job:  [id number]   cc:   Vania Rea. Supple, M.D.  Fax: [phone]   Mila Homer. Sherlean Foot, M.D.  Fax: [phone]

## 2010-12-18 NOTE — Assessment & Plan Note (Signed)
HISTORY OF PRESENT ILLNESS:  Ms. Seith comes in today.  She has been  scheduled for medial branch block; however, she has been on Aleve on a  b.i.d. basis, and given the potential effect of some mild  anticoagulation, I informed her that we need to reschedule on this.  She  does give interval history of undergoing removal of scar tissue from the  right knee per Dr. Lequita Halt.  She had this surgery in April.  Dr. Lequita Halt  has been writing for her postoperative pain medications, which I have  been aware of.  I last saw the patient on October 13, 2007.  She has had  no postoperative complications.  She has had continued pain in her back  and bilateral lower extremities.  She also has some left shoulder pain.  She rates her pain as about an 8/10, interfering with activity at a 4/10  level.   PHYSICAL EXAMINATION:  In general, in no acute distress.  Mood and  affect appropriate.  Her back has tenderness in the lumbar paraspinals.  She has pain with extension greater than with flexion.  Her lower  extremity strength is normal in the hip flexion, knee extension, and  ankle dorsiflexion.  Her gait shows no evidence of toe drag or knee  instability.  Her mood and affect are appropriate.   IMPRESSION:  Lumbar axial pain, does have some lower extremity pain too,  I believe that it is multifactorial, and right side certainly still has  some postoperative pain.  She does have lumbar degenerative disk, but I  am more suspicious that she really has a facet syndrome and lumbar  spondylosis as the cause of her pain, given her extensor pain.   PLAN:  1. We will recheck urine drug screen, and if this checks out just      showing some oxycodone, I can resume her narcotic analgesic      medication prescription.  2. We will have to hold her Aleve for about a week prior to      rescheduled medial branch block.   As I discussed with the patient, I will be handling her pain medications  from here on out until  further notice.      Erick Colace, M.D.  Electronically Signed     AEK/MedQ  D:  01/21/2008 11:43:33  T:  01/22/2008 00:53:06  Job #:  [id number]   cc:   Ollen Gross, M.D.  Fax: [phone]

## 2010-12-18 NOTE — Op Note (Signed)
NAME:  Tina Neal, Tina Neal               ACCOUNT NO.:  [id number]   MEDICAL RECORD NO.:  [id number]          PATIENT TYPE:  AMB   LOCATION:  [facility]                         FACILITY:  [facility]   PHYSICIAN:  Ollen Gross, M.D.    DATE OF BIRTH:  [birth year]   DATE OF PROCEDURE:  11/04/2007  DATE OF DISCHARGE:                               OPERATIVE REPORT   PREOPERATIVE DIAGNOSIS:  Patellar clunk syndrome, right knee.   POSTOPERATIVE DIAGNOSIS:  Patellar clunk syndrome, right knee.   PROCEDURE:  Right knee arthroscopy with synovectomy.   SURGEON:  Ollen Gross, M.D.   ASSISTANT:  None.   ANESTHESIA:  General.   ESTIMATED BLOOD LOSS:  Minimal.   DRAINS:  None.   COMPLICATIONS:  None.   CONDITION:  Stable to recovery.   BRIEF CLINICAL NOTE:  [name] is a 60 year old female who has had multiple  prior knee surgeries, including knee replacement and revision.  She was  doing fairly well and then has had progressive pain and popping in the  knee.  This is all consistent with the patellar clunk syndrome.  She  presents now for arthroscopy and synovectomy.   PROCEDURE IN DETAIL:  After successful administration of general  anesthetic, a tourniquet is placed high on her right thigh, and right  lower extremity prepped and draped in the usual sterile fashion.  A  standard superior medial and inferolateral incision is made.  The inflow  cannula is passed.  Superior medial camera passed inferolateral.  Arthroscopic visualization proceeds.  She does have a fair amount of  hypertrophic synovitis but not an angry, red inflamed synovitis.  There  is a nodule right around the junction of the patella and quadriceps  tendon.  I started to debride this with a shaver from the inferior  medial portal and then also created a superolateral portal in order to  get the extent of it.  We then also used the Arthricare device to finish  the debridement and get it back to stable edges.  I inspected the entire  joint, so no signs of any foreign bodies within the joint and no  evidence of any prosthetic loosening.  The medial and lateral gutters  were also cleared of any hypertrophic tissue with the ArthroCare device.  I again inspected the joint and felt that the tissue was adequately  resected.  The arthroscopic equipment was then  removed from the inferior portals and superior lateral portal, which  were closed with interrupted 4-0 nylon.  Marcaine 0.25% 20 cc with epi  injected through the inflow cannula, then that is removed and that  portal closed with nylon.  A bulky sterile dressing is applied.  She is  awakened and transported to recovery in stable condition.      Ollen Gross, M.D.  Electronically Signed     FA/MEDQ  D:  11/04/2007  T:  11/04/2007  Job:  [id number]

## 2010-12-18 NOTE — Consult Note (Signed)
HISTORY:  Consult requested for evaluation of back pain, right lower  extremity pain, knee pain.   CHIEF COMPLAINT:  Back pain, leg pain, knee pain, as well as right  greater than left shoulder pain and some hand pain.   The patient is a 60 year old female who had slipped at work in 1999 and  had a knee injury, which was treated at [facility] by Dr.  Ronnell Guadalajara prior to his retirement.  She had several knee surgeries,  including right total knee, which was prior to 2003.  She had right  total knee revision November 05, 2001.  Had another right total knee  revision March 05, 2006.  She did have an infection and had an  antibiotic spacer placed at one point as well.  In regard to her back,  she had been seen on several occasions by Dr. Ethelene Hal, who reportedly per  patient performed several lumbar spine injections, although I do not  have all the records.  She had been recommended for spinal cord  stimulator and underwent evaluation and then since failed for this.  She  states she had 2 stim trials and she thinks it really was not helpful.  She last saw Dr. Lequita Halt several months ago and he felt her knee pain  was largely related to her back.   In terms of her pain, she has been using Percocet prescribed for b.i.d.,  she sometimes takes 2 at a time.   OTHER PAST MEDICAL HISTORY:  1. Subacromial bursitis, treated as recently as November 2008 by      orthopedics on the right side.  2. The other past history includes treatment for a closed ankle      fracture per Dr. Lestine Box.  3. She did have right rotator cuff surgery.   PAST SURGICAL HISTORY:  In addition to above has had the right rotator  cuff surgery in 2005.   OTHER MEDICATIONS TRIED:  1. Neurontin made her feel funny.  2. Celebrex helped.  She was afraid to take it due to some safety      concerns a few years ago.  3. She has tried Duragesic patch which was used for awhile, but not      any better than  Percocet.   IMAGING STUDIES:  Last MRI of the lumbar spine date October 12, 2006  showed a small broad-based bulging L3-4, mild facet hypertrophy, no  stenosis.  L4-5 disk bulges to the left.  Annular fissure laterally.  Slight progression compared to prior MRI dated February 19, 2005 and  potentially intact left L4 nerve root.  This is on the left.  On the  right she did have some facet hypertrophy at L5-S1, right greater than  left.   SOCIAL HISTORY:  The patient smokes a pack a day.  Denies any alcohol or  drug abuse.   FAMILY HISTORY:  Mother deceased at age 36 with asthma.  Father with  diabetes and hypertension.   EXAMINATION:  GENERAL:  No acute distress.  Mood and affect appropriate.  VITAL SIGNS:  Blood pressure 112/74, pulse 64, respirations 18, O2 sat  99% on room air.  NEUROLOGICAL:  Gait shows no evidence of toe dragging, or knee  instability. Slightly favors the right lower extremities. Affect is  bright and alert.  Orientation x3.  She is moderately obese.  NECK:  Range of motion is full.  MUSCULOSKELETAL:  Shoulder range of motion:  She has positive  impingement testing bilaterally at 90  degrees, although she can bring  her arms over her head.  Elbow, wrist, hand range of motion is normal.  Normal strength in bilateral deltoid, biceps, triceps.  Grip with pain  at free range.  She has normal sensation upper extremities.  Normal deep  tendon reflexes biceps, triceps, brachial radialis.  Lower extremity:  She has no tenderness to palpation in the lumbar paraspinals.  She has  pain with both forward flexion and extension.  She has approximately 50%  range in each of these areas.  Her lower extremity strength is 5/5  bilateral hip flexor, knee extensor, ankle dorsiflexor.  Knee range of  motion is full extension to 135 of flexion on the right knee, which is  equal to the left knee.  There is crepitus, right anterior knee greater  than left.  There is no peripheral edema in  the lower extremities.  Her  sensation is intact in bilateral lower extremities in the L3, 4, 5, S1  dermatomes, staying away from her surgical site on the right.  She has  multiple healed surgical scars in the right knee area.  Coordination is  intact.  Deep tendon reflexes normal in the lower extremities.  She also  has normal tone in the upper and lower extremities.  There is some mild  joint mediolateral instability right knee, otherwise intact.   IMPRESSION:  1. Chronic low back pain.  She has both axial and some radicular      symptoms, certainly some can be pseudoradicular and more related to      a facet problem.  She has no other neurologic signs that indicate      radiculopathy.  2. Chronic right postoperative knee pain.  She has had multiple      procedures in that area, however, she has good range of motion and      no particular pain with weightbearing.  Certainly some of the pain      can be related to the back and may be facet versus sacroiliac as      well.  I may consider EMG to further evaluate.   PLAN:  1. We will check urine drug screen.  2. Would consider changing Percocet to a long-acting medication if      need be.  3. Send to physical therapy for a lumbar stabilization program.  She      does not think she has ever had therapy directed toward her back      complaints.  4. Short-term Celebrex.  I do not think long-term she should be on      this, she has a history of gastritis.  Seen at [facility] in May.      Short-term use should not be a problem.  5. Consider lumbar medial branch blocks.  She does not appear to have      had these in the past.   Thank you for this interesting consultation.  The patient would like to  eventually be transitioned to an outpatient or a home exercise program  and will consider community based aquatic aerobics.      Erick Colace, M.D.  Electronically Signed     AEK/MedQ  D:08/20/2007 14:10:00  T:08/20/2007  15:27:03  Job #:  [id number]   cc:   Candyce Churn, M.D.  Fax: [phone]   Ollen Gross, M.D.  Fax: [phone]   Brooke Bonito  Fax: [phone]

## 2010-12-18 NOTE — Assessment & Plan Note (Signed)
This is a followup visit for chronic low back pain and chronic right  postoperative knee pain.  The patient seen by me in initial consultation  August 20, 2007.   Ms. Tina Neal follows up today.  She was seen by me in initial  consultation August 20, 2007.  The patient reported taking Percocet  August 19, 2007, and she was tested August 20, 2007, however, Percocet  or oxycodone did not show up.  I have asked her to give another sample  today.  We did make her follow up with physical therapy, and she did  follow through on this.  She has had no new medical complications in the  interval time period.  She was trialed on some Lidoderm patch.  She  returns today with pain still in the low back and in the right lower  extremity area.  Her sleep is fair.  Her pain is worse with walking and  standing.  She continues to smoke a half a pack per day.  In the  interval time period, she has undergone gallbladder surgery September 06, 2007.  She still has Percocet problems in the postoperative period of  time.  She takes 1 to 2 a day.  Is following up with her surgeon this  week.  Looking through her E-chart, she had an ERCP done on September 11, 2007, showing slightly dilated common bile duct and sphincterotomy.   PHYSICAL EXAMINATION:  BACK:  There is no tenderness to palpation.  Her  lumbar range of motion 50% forward flexion and extension, lateral  rotation and bending.   Sensation intact in all extremities.  Deep tendon reflexes normal in the  lower extremities.  Some medial lateral instability, right knee.  She  has good hip range of motion.   IMPRESSION:  Chronic low back pain, axial and radicular symptoms.  May  be more of a facet syndrome.  Would like to see what type of injection  she has had per Dr. Ethelene Hal.  The next recheck urine drug screen not  prescribing Percocet unless we actually see this in her urine.  Continue  physical therapy.      Erick Colace, M.D.  Electronically Signed     AEK/MedQ  D:  09/16/2007 13:06:54  T:  09/17/2007 15:51:31  Job #:  [id number]   cc:   Candyce Churn, M.D.  Fax: [phone]

## 2010-12-18 NOTE — Assessment & Plan Note (Signed)
Ms. Steffey follows up today.  I last saw her on February 15, 2008.  She has  a history of lumbar spondylosis without myelopathy.  She has a history  of chronic postoperative right knee pain.  She has a follow up with Dr.  Lequita Halt on this.  She continued on oxycodone for pain control 5/325 one  p.o. t.i.d., #90, was last written on March 03, 2008.  She states her  back pain has gotten worse, she would like to look into lumbar  injection.  We discussed medial branch blocks, she would like to have  this done as soon as she can.  Her average pain is 7/10 currently.  She  is independent with all her self-care and mobility.   REVIEW OF SYSTEMS:  Otherwise negative.  No bowel or bladder  dysfunction.   PHYSICAL EXAMINATION:  VITAL SIGNS:  Her blood pressure is 102/68, pulse  84, respirations 18, and O2 sats 100% on room air.  GENERAL:  In no acute distress.  Mood and affect appropriate.  Gait is  normal.  She has pain with extension of lumbar spine, really has no  extension.  She can flex forward to 75% normal range.  Her lower  extremities strength is normal.  Deep tendon reflexes are normal.   IMPRESSION:  1. Lumbar spinal stenosis and myelopathy.  2. Chronic postoperative knee pain.  She has multiple surgical scars      in the anterior knee, no scar hypersensitivity, no knee swelling.   PLAN:  1. We will continue her Percocet t.i.d.  2. Schedule for medial branch block.      Erick Colace, M.D.  Electronically Signed     AEK/MedQ  D:  03/31/2008 17:03:35  T:  04/01/2008 04:39:34  Job #:  [id number]   cc:   Ollen Gross, M.D.  Fax: [phone]

## 2010-12-18 NOTE — Op Note (Signed)
NAME:  Tina Neal, Tina Neal               ACCOUNT NO.:  [id number]   MEDICAL RECORD NO.:  [id number]          PATIENT TYPE:  INP   LOCATION:  5121                         FACILITY:  [facility]   PHYSICIAN:  Thornton Park. Daphine Deutscher, MD  DATE OF BIRTH:  07/05/1951   DATE OF PROCEDURE:  09/05/2007  DATE OF DISCHARGE:                               OPERATIVE REPORT   PREOPERATIVE DIAGNOSIS:  Acute cholecystitis.   POSTOPERATIVE DIAGNOSIS:  Acute cholecystitis.   PROCEDURE:  Laparoscopic cholecystectomy, intraoperative cholangiogram.   SURGEON:  Thornton Park. Daphine Deutscher, M.D.   ASSISTANT:  Currie Paris, M.D.   ANESTHESIA:  General endotracheal.   FINDINGS:  Some evidence of acute cholecystitis.  Cholangiogram showed  either bubbles that were seen kind of when the cholangiogram was  completed bubbling out of the common duct or possibly soft stones in the  distal common bile duct on the cholangiogram.  Free flow of contrast  into the duodenum and a slightly dilated common bile duct.   DESCRIPTION OF PROCEDURE:  Tina Neal was taken to room 16 on  Saturday, September 04, 2006 and given general anesthesia.  The abdomen  was prepped with Betadine and draped sterilely.  A transverse incision  was made where she had previously had surgery through her belly button  at an infraumbilical position, and I entered the abdomen through the  longitudinal slit in the fascia without difficulty.  The abdomen was  insufflated, and then three trocars were placed in the upper abdomen.  The gallbladder was grasped and elevated, and Calot's triangle was  dissected with the hook electrocautery, revealing the artery and the  duct which was clipped up on the gallbladder.  I incised that with the  scissors and did a cholangiogram.  The catheter was put in with Hypaque,  and I had to remove that and then put some saline in, and I may have  flushed in some air when I did that or just in the duct being opened it  could have  gotten positive pressure in there.  Nevertheless, I took a  cholangiogram which showed what looked like a filling defect about three-  quarters of a centimeter in diameter down in the distal common duct that  was present on two runs of the cholangiogram.  However, once I went  ahead and took the catheter out and I milked the common bile duct, I  noticed that bile and bubbles bubbled out of the cystic duct, indicating  that these may well have been bubbles of air.  The cystic duct was then  triple clipped and divided.  The cystic artery was double clipped and  divided, and then the gallbladder was removed without entering it.  The  gallbladder bed was maintained hemostatic, and no bile leaks were noted.  The gallbladder was placed in a bag and brought out through the  umbilicus. The umbilical area was repaired with 0 Vicryl introduced  simply and then tied, and this closed off any leak from  pneumoperitoneum.  The wounds were all closed with 4-0 Vicryl, Benzoin,  and Steri-Strips.   The  patient was taken to the recovery room.  Prior to awakening, she had  Marcaine injected into all of the ports for pain control.   FINAL DIAGNOSIS:  Acute cholecystitis, status post laparoscopic  cholecystectomy, intraoperative cholangiogram.      Molli Hazard B. Daphine Deutscher, MD  Electronically Signed     MBM/MEDQ  D:  09/05/2007  T:  09/06/2007  Job:  [id number]

## 2010-12-18 NOTE — Procedures (Signed)
NAME:  Tina Neal, Tina Neal               ACCOUNT NO.:  [id number]   MEDICAL RECORD NO.:  [id number]          PATIENT TYPE:  REC   LOCATION:  TPC                          FACILITY:  [facility]   PHYSICIAN:  Erick Colace, M.D.DATE OF BIRTH:  [birth year]   DATE OF PROCEDURE:  10/13/2007  DATE OF DISCHARGE:  09/11/2007                               OPERATIVE REPORT   PROCEDURE PERFORMED:  Right subacromial bursa injection.   INDICATIONS:  Right subacromial bursitis/rotator cuff syndrome.  The  pain is only partially responsive to medication management including  narcotic analgesic medications.  She has GERD and is unable to take  nonsteroidals.  She is on Nexium.  Informed consent was obtained after  describing the risks and benefits to the patient.  These include  bleeding, bruising and infection.  She elects to proceed and has given  written consent.   PROCEDURE IN DETAIL:  A posterolateral approach was utilized, the area  marked, prepped with Betadine, entered with a 25 gauge 1 1/2 inch needle  to approximately 1 inch depth. After negative drawback for blood, 1 mL  of 41 mg/mL Depo-Medrol and 4 mL of 1% lidocaine injected.  The patient  tolerated the procedure well.  Post injection instructions given.  Her  last injection was November 2008.      Erick Colace, M.D.  Electronically Signed     AEK/MEDQ  D:  10/13/2007 08:25:11  T:  10/14/2007 00:59:33  Job:  [id number]   cc:   Ollen Gross, M.D.  Fax: [phone]

## 2010-12-18 NOTE — Assessment & Plan Note (Signed)
Tina Neal is a 60 year old female with chronic postoperative knee pain.  She has had some back pain as well.  She has interval time undergone  further neurosurgical evaluation and deemed to be appropriate for fusion  surgery which she is scheduled to undergo February 16, 2009.  She has had a  second opinion from the second spine surgeon, Dr. Shon Baton who agreed with  the procedure.   Her current pain medication is Percocet 5/325 one p.o. t.i.d.  As noted  before been seen her both for her knee pain primarily and then her back  pain.  She has had good short-term relief with medial branch blocks, but  did not want to undergo radiofrequency procedure.  She did go through  some physical therapy, but without significant relief persistence.   Her Oswestry score is 54%.   Her pain level is 7/10.  She has pain in her back as well as left lower  extremity posteriorly and right lower extremity anteriorly.   Relief from meds is good.  Her blood pressure is 136/93, pulse 95,  respirations 18, O2 sat 99% on room air.  General, no acute stress.  Orientation x3.  Affect alert.  Gait is normal.   Her motor strength is 5/5 bilateral extremities.  Straight leg raise is  negative.  Deep tendon reflexes are normal.  She has mild tenderness to  palpation in the lumbar paraspinals.  Lumbar spine range of motion 50%  forward flexion and extension.   IMPRESSION:  1. Lumbar pain with lumbar degenerative disk.  2. Knee pain chronic postoperative following knee placement surgery on      the right side.   PLAN:  I will continue on Percocet 5/325 one p.o. t.i.d.  I have written  97-month supply, should come back in a month, have nursing check and get  another refill prior to her surgery.   Postoperatively, Dr. Thayer Ohm will manage her and I will see her back on a  p.r.n. basis.      Erick Colace, M.D.  Electronically Signed     AEK/MedQ  D:  12/20/2008 12:29:20  T:  12/21/2008 01:49:51  Job #:   [id number]

## 2010-12-18 NOTE — Assessment & Plan Note (Signed)
Ms. Tina Neal returns today.  She indicates that she is scheduled  for lumbar spine surgery per Dr. Shon Baton on April 13, 2009.  She has  had failure of conservative care.  She has back pain and left lower  extremity pain primarily.  She does not know whether or not she is  getting effusion or laminectomy.  She has a pain level of 7/10, pain  wakes her up at night.  Pain is worse with bending.  She can walk 1  minute at a time.  She drives.  She needs assistance with household  duties and shopping.   PAST HISTORY:  Thyroid disease, high blood pressure, arthritis.  She has  had chronic knee pain after right total knee.   PHYSICAL EXAMINATION:  VITAL SIGNS:  Her blood pressure is 126/75, pulse  82, respirations 18, sats 99% on room air.  GENERAL:  In no acute distress.  Mood and affect appropriate.  EXTREMITIES:  She has crepitus in the right knee with decreased range of  motion and 4/5 strength.  Left knee 5/5 strength.  Deep tendon reflexes  are normal bilaterally.  She has tenderness around the L3 level on the  left side in the paraspinal region.  She has pain both, with forward  flexion and with extension, although extension is somewhat more painful.   IMPRESSION:  Lumbar pain.  She has lumbosacral disk disorder and has  undergone various injections including medial branch blocks which have  not been helpful.  She has been on chronic narcotic analgesics, Percocet  5/325 t.i.d.  I did indicate to her since she is undergoing surgery that  it would be helpful for her postoperative pain management to reduce her  preoperative narcotic analgesic dose.  I have written instructions to  take the usual dose for the next 3 weeks but then 2 weeks prior to  surgery take b.i.d. dosing and then 1 week prior to surgery just nightly  dosing.  She understands these instructions and these have been written  out on her prescription pad.  I will see her back on a p.r.n. basis.  Dr. Shon Baton will take  over postoperative pain management.      Erick Colace, M.D.  Electronically Signed     AEK/MedQ  D:  03/02/2009 10:57:25  T:  03/03/2009 07:15:17  Job #:  [id number]   cc:   Alvy Beal, MD  Fax: [phone]

## 2010-12-18 NOTE — Assessment & Plan Note (Signed)
Tina Neal follows up today for chronic low back pain.  She has lumbar  facet syndrome, was scheduled for medial branch block but states that  she has had so many procedures recently that she is not sure whether she  really wants to undergo anything.  She is also concerned about not using  conscious sedation.  She has had other blocks done when she was out.  She has had a previous procedure cancelled secondary to being on Aleve.   She has had no signs of aberrant drug behavior.  Her medications are  oxycodone 5/325 one p.o. t.i.d.  She has had no new medical  complications since I last saw her.   Pain level is 7/10, low back pain, interferes with activities.   PHYSICAL EXAMINATION:  GENERAL:  No acute distress.  Mood and affect  appropriate.  Her back has some tenderness to palpation, lumbosacral  junction, increasing with extension and not as bad with flexion.  Her  lower extremity strength is normal.  Gait is normal.   IMPRESSION:  1. Lumbar facet syndrome.  2. Lumbar spondylosis without myelopathy in the setting of history of      lumbar degenerative disc.   I discussed the purpose of medial branch block as well as the location  using a spine model.  At this point, she wants to hold off.  I will  recheck her in 1 month, continue her on her oxycodone medication, and  she said she will decide whether she wants to proceed with any other  type of procedures.      Erick Colace, M.D.  Electronically Signed     AEK/MedQ  D:  03/03/2008 13:15:17  T:  03/04/2008 05:19:08  Job #:  [id number]

## 2010-12-18 NOTE — Procedures (Signed)
NAME:  Tina Neal, Tina Neal               ACCOUNT NO.:  [id number]   MEDICAL RECORD NO.:  [id number]          PATIENT TYPE:  REC   LOCATION:  TPC                          FACILITY:  [facility]   PHYSICIAN:  Erick Colace, M.D.DATE OF BIRTH:  1951-03-04   DATE OF PROCEDURE:  DATE OF DISCHARGE:                               OPERATIVE REPORT   PROCEDURE:  Right L5 dorsal ramus injection, right L4 medial branch  block, right L3 medial branch block under fluoroscopic guidance.   INDICATIONS:  Lumbar pain spondylosis without myelopathy.  Pain is only  partially responsive to medication management including narcotic  analgesics.   Informed consent was obtained after describing risks and benefits of the  procedure with the patient.  These include bleeding, bruising,  infection.  She elects to proceed and has given written consent.  The  patient placed prone on fluoroscopy table.  Betadine prep, sterilely  draped.  A 25-gauge, 1-1/2-inch needle was used to anesthetize the skin  and subcutaneous tissue, 1% lidocaine x2 mL, and 22-gauge, 3-1/2-inch  spinal needle was inserted under fluoroscopic guidance first starting in  the right S1, SAP and sacroiliac junction.  Bone contact was made and  confirmed with lateral imaging.  Omnipaque 180 x 0.5 mL demonstrated no  intravascular uptake, then 0.5 mL of solution containing 1 mL of 4 mg/mL  dexamethasone and 2 mL of 2% MPF lidocaine and the right L5, SAP,  transverse process junction targeted, bone contact made, confirmed.  Omnipaque 180 x 0.5 mL demonstrated no intravascular uptake and 0.5 mL  of dexamethasone-lidocaine solution was injected in the right L4, SAP,  transverse process junction targeted, bone contact made confirmed with  lateral imaging.  Omnipaque 180 x 0.5 mL demonstrated no intravascular  uptake, and 0.5 mL dexamethasone-lidocaine solution was injected.  Pre  and post injection vitals stable.  Post injection instructions given.  Pre  injection pain level 7/10.  Post injection 3/10.  We will repeat in  1 month.  I gave Valium prior to procedure.      Erick Colace, M.D.  Electronically Signed     AEK/MEDQ  D:  05/02/2008 09:42:01  T:  05/02/2008 23:52:33  Job:  [id number]

## 2010-12-18 NOTE — Assessment & Plan Note (Signed)
A 60 year old female with history of right total knee replacement,  chronic postoperative pain maintained on narcotic analgesics with good  functioning level, did see Dr. Lequita Halt in regards to her chronic  postoperative knee pain yesterday.  Physical therapy finished up with  her.  She has some pain with knee extension exercise with ankle weight  on.  She did not have any other new problems in the interval time.  Her  back is doing relatively well, cold weather seems to aggravate her  symptoms.  She does have a history of lumbosacral spondylosis without  myelopathy.  Her Oswestry disability score 56%.   PHYSICAL EXAMINATION:  GENERAL:  In no acute distress.  Mood and affect  appropriate.  She has mild crepitus in the right knee but not in the  left knee.  No evidence of knee effusion.  No tenderness to palpation  around the patellar or along the joint line area.  Her back has no  tenderness to palpation to paraspinal.  She has limited range of motion  approximately 50% forward flexion and extension.  Extremities show no  evidence of edema.  No fasciculations or atrophy.  No erythema.   IMPRESSION:  1. Chronic postoperative knee pain.  No signs of reflex sympathetic      dystrophy.  2. Left knee osteoarthritis probable.  3. Lumbosacral disk disorder.  4. Chronic back pain with some spondylosis.   PLAN:  1. Continue oxycodone 5/325 t.i.d.  2. Continue home exercise program.  Nursing visit next month.  I will      see her in 2 months.      Erick Colace, M.D.  Electronically Signed     AEK/MedQ  D:  08/02/2008 08:46:58  T:  08/02/2008 22:42:03  Job #:  [id number]

## 2010-12-21 NOTE — Discharge Summary (Signed)
NAME:  Tina Neal, Tina Neal               ACCOUNT NO.:  [id number]   MEDICAL RECORD NO.:  [id number]          PATIENT TYPE:  INP   LOCATION:  1521                         FACILITY:  [facility]   PHYSICIAN:  Ollen Gross, M.D.    DATE OF BIRTH:  04/15/1951   DATE OF ADMISSION:  03/05/2006  DATE OF DISCHARGE:  03/08/2006                                 DISCHARGE SUMMARY   ADMISSION DIAGNOSES:  1. Unstable right total knee.  2. Hypertension.  3. Asthma.  4. Hypothyroidism.  5. Gastroesophageal reflux disease.  6. Past history of transfusion with previous knee surgery.  7. History of rheumatic fever.  8. History of knee infection.   DISCHARGE DIAGNOSES:  1. Unstable right total knee, status post revision, right total knee      arthroplasty.  2. Postoperative hyponatremia.  3. Hypertension.  4. Asthma.  5. Hypothyroidism.  6. Gastroesophageal reflux disease.  7. Past history of transfusion with previous knee surgery.  8. History of rheumatic fever.  9. History of knee infection.   PROCEDURE:  On March 05, 2006, right total knee revision.   SURGEON:  Ollen Gross, M.D.   ASSISTANT:  Tina Neal, P.A.-C.   ANESTHESIA:  General with postoperative Marcaine pain pump.   TOURNIQUET TIME:  Ninety-two minutes, down for 10 and up for 26 minutes.   CONSULTS:  None.   BRIEF HISTORY:  Tina Neal is a 60 year old female with multiple arthropathies of  the right knee.  She has an unstable total knee, progressive worsening pain  and dysfunction, now presents for revision.   LABORATORY DATA:  Preop CBC:  Hemoglobin 12.7, hematocrit 38.8.  Postop  hemoglobin only 11.1.  It drifted down a little bit further, down to, last  noted at 10.5 with a hematocrit of 30.  PT/PTT preop 12.8 and 35,  respectively.  INR 1.  Serial pro times followed:  Last noted PT/INR 27.9  and 2.5.  Chem panel on admission all within normal limits.  Serial BMETs  followed.  Sodium did drop from 140 to 137, last noted  at 134.  The  remainder of electrolytes remained within normal limits.  Urine pregnancy  negative.  Preop UA:  Elevated urobilinogen at 2.  Remaining urinalysis  negative.  Blood group type A+.   EKG on February 28, 2006, normal sinus rhythm, normal EKG.  No significant  change since last tracing of November 15, 2005, confirmed by Dr. Gala Neal.   Two view chest on May 01, 2006, no acute findings.   Two view knee, March 05, 2006, well-seated components, right knee prosthesis  without complicating features.   HOSPITAL COURSE:  Patient was admitted to [facility], tolerated  the procedure well, and was later transferred to the recovery room and then  to the orthopedic floor.  Started on PCA and p.o. analgesics for pain  control following surgery.  Did well.  Was doing pretty well on the morning  of day #1, especially due to the extensive procedure that patient underwent.  Had a little bit of bloody drainage at the proximal end of the dressing.  Hemovac drain placed at the time of surgery was pulled.  Started getting up  with physical therapy.  Discharge planning made arrangements.  If she did  well, she may be ready to go home over the weekend.  By day #2, she was  doing pretty well.  She is hurting some since the IV came out.  She was off  the PCA.  Encouraged p.o. meds.  Gave Dulcolax tabs for bowel program.  Hemoglobin was 10.9.  Dressing changed.  Incision looked good.  Got up with  physical therapy. Was ambulating approximately 30 feet and later 40 feet.  Progressing well.  Doing so well that the patient was ready to go home by  the following day of March 08, 2006.   DISCHARGE PLAN:  1. Patient was discharged home on March 08, 2006.  2. Discharge diagnoses:  Please see above.  3. Discharge meds:  Coumadin, Percocet, Robaxin.  4. Diet:  Resume previous home diet.  5. Activity:  Weightbearing as tolerated.  Total knee protocol.  Home      health PT/OT and home health  nursing.  6. Follow up on Tuesday, August 14, with Dr. Despina Neal.  Call for appointment      time.   DISPOSITION:  Home.   CONDITION ON DISCHARGE:  Improved.      Tina Neal, P.A.      Ollen Gross, M.D.  Electronically Signed    ALP/MEDQ  D:  04/10/2006  T:  04/10/2006  Job:  [id number]   cc:   Candyce Churn, M.D.  Fax: [phone]

## 2011-04-21 ENCOUNTER — Inpatient Hospital Stay (INDEPENDENT_AMBULATORY_CARE_PROVIDER_SITE_OTHER)
Admission: RE | Admit: 2011-04-21 | Discharge: 2011-04-21 | Disposition: A | Discharge status: Home / Self Care | Payer: Medicaid Other | Source: Ambulatory Visit | Attending: Family Medicine | Admitting: Family Medicine

## 2011-04-21 DIAGNOSIS — M79609 Pain in unspecified limb: Secondary | ICD-10-CM

## 2011-04-21 LAB — POCT I-STAT, CHEM 8
Calcium, Ion: 1.09 mmol/L — ABNORMAL LOW (ref 1.12–1.32)
HCT: 37 % (ref 36.0–46.0)
Hemoglobin: 12.6 g/dL (ref 12.0–15.0)
Sodium: 143 mEq/L (ref 135–145)
TCO2: 24 mmol/L (ref 0–100)

## 2011-04-26 LAB — COMPREHENSIVE METABOLIC PANEL
ALT: 68 — ABNORMAL HIGH
ALT: 54 — ABNORMAL HIGH
AST: 95 — ABNORMAL HIGH
AST: 21
AST: 56 — ABNORMAL HIGH
Albumin: 3.5
Albumin: 3.6
Albumin: 3 — ABNORMAL LOW
Alkaline Phosphatase: 186 — ABNORMAL HIGH
Alkaline Phosphatase: 128 — ABNORMAL HIGH
BUN: 7
BUN: 4 — ABNORMAL LOW
CO2: 26
CO2: 27
Calcium: 8.4
Calcium: 8.9
Calcium: 8.1 — ABNORMAL LOW
Chloride: 109
Chloride: 108
Creatinine, Ser: 0.56
Creatinine, Ser: 0.64
Creatinine, Ser: 0.63
GFR calc Af Amer: >60
GFR calc Af Amer: >60
GFR calc Af Amer: >60
GFR calc non Af Amer: >60
GFR calc non Af Amer: >60
GFR calc non Af Amer: >60
Glucose, Bld: 83
Glucose, Bld: 106 — ABNORMAL HIGH
Potassium: 3.5
Potassium: 3.5
Sodium: 141
Sodium: 139
Total Bilirubin: 0.8
Total Bilirubin: 0.5
Total Protein: 6.6
Total Protein: 7.2
Total Protein: 5.8 — ABNORMAL LOW

## 2011-04-26 LAB — HEPATIC FUNCTION PANEL
ALT: 79 — ABNORMAL HIGH
AST: 137 — ABNORMAL HIGH
Albumin: 3.7
Alkaline Phosphatase: 200 — ABNORMAL HIGH
Total Protein: 6.8

## 2011-04-26 LAB — DIFFERENTIAL
Eosinophils Absolute: 0.1
Lymphocytes Relative: 17
Lymphs Abs: 1.4
Monocytes Relative: 4
Neutro Abs: 6.3
Neutrophils Relative %: 77

## 2011-04-26 LAB — CBC
HCT: 36.8
HCT: 36.9
HCT: 32.3 — ABNORMAL LOW
Hemoglobin: 12.2
Hemoglobin: 12.3
Hemoglobin: 10.6 — ABNORMAL LOW
MCHC: 33.1
MCHC: 33.3
MCHC: 34.3
MCHC: 32.9
MCV: 83.4
MCV: 83.8
MCV: 82.2
MCV: 84.6
Platelets: 240
Platelets: 250
Platelets: 255
Platelets: 207
RBC: 4.41
RBC: 4.41
RBC: 3.82 — ABNORMAL LOW
RDW: 15.8 — ABNORMAL HIGH
RDW: 15.9 — ABNORMAL HIGH
RDW: 15.4
RDW: 16.1 — ABNORMAL HIGH
WBC: 6.4
WBC: 8.2
WBC: 6.4

## 2011-04-26 LAB — TYPE AND SCREEN
ABO/RH(D): A POS
Antibody Screen: POSITIVE
DAT, IgG: NEGATIVE
Donor AG Type: NEGATIVE
PT AG Type: POSITIVE

## 2011-04-26 LAB — POCT I-STAT CREATININE
Creatinine, Ser: 0.8
Operator id: 282201

## 2011-04-26 LAB — I-STAT 8, (EC8 V) (CONVERTED LAB)
BUN: 9
Bicarbonate: 25 — ABNORMAL HIGH
Chloride: 108
Glucose, Bld: 94
HCT: 41
Hemoglobin: 13.9
Operator id: 282201
Potassium: 3.8
Sodium: 141
TCO2: 26
pCO2, Ven: 41.9 — ABNORMAL LOW
pH, Ven: 7.385 — ABNORMAL HIGH

## 2011-04-26 LAB — PROTIME-INR
INR: 0.9
Prothrombin Time: 12.5

## 2011-04-26 LAB — AMYLASE: Amylase: 93

## 2011-04-30 LAB — POCT I-STAT 4, (NA,K, GLUC, HGB,HCT)
Hemoglobin: 14.6
Sodium: 139

## 2011-07-10 ENCOUNTER — Emergency Department (INDEPENDENT_AMBULATORY_CARE_PROVIDER_SITE_OTHER)
Admission: EM | Admit: 2011-07-10 | Discharge: 2011-07-10 | Disposition: A | Discharge status: Home / Self Care | Payer: Medicare Other | Source: Home / Self Care | Attending: Family Medicine | Admitting: Family Medicine

## 2011-07-10 DIAGNOSIS — N390 Urinary tract infection, site not specified: Secondary | ICD-10-CM

## 2011-07-10 HISTORY — DX: Essential (primary) hypertension: I10

## 2011-07-10 HISTORY — DX: Gastro-esophageal reflux disease without esophagitis: K21.9

## 2011-07-10 LAB — POCT URINALYSIS DIP (DEVICE)
Glucose, UA: NEGATIVE mg/dL
Hgb urine dipstick: NEGATIVE
Specific Gravity, Urine: 1.02 (ref 1.005–1.030)

## 2011-07-10 MED ORDER — CEPHALEXIN 500 MG PO CAPS: 500.0000 mg | ORAL_CAPSULE | Freq: Four times a day (QID) | ORAL | Status: DC

## 2011-07-10 NOTE — ED Provider Notes (Signed)
History     CSN: [id number] Arrival date & time: 07/10/2011  7:19 PM   First MD Initiated Contact with Patient 07/10/11 1731      Chief Complaint  Patient presents with  . Back Pain  . Abdominal Pain  . Urinary Urgency    (Consider location/radiation/quality/duration/timing/severity/associated sxs/prior treatment) Patient is a 60 y.o. female presenting with back pain and abdominal pain. The history is provided by the patient.  Back Pain  This is a new problem. The current episode started more than 1 week ago. The problem has not changed since onset.The pain is present in the lumbar spine. The pain does not radiate. The pain is mild. Associated symptoms include abdominal pain and dysuria. Pertinent negatives include no fever.  Abdominal Pain The primary symptoms of the illness include abdominal pain and dysuria. The primary symptoms of the illness do not include fever, vaginal discharge or vaginal bleeding.  The dysuria is associated with frequency and urgency. The dysuria is not associated with vaginal pain.  Additional symptoms associated with the illness include urgency, frequency and back pain.    Past Medical History  Diagnosis Date  . Asthma   . Hypertension   . GERD (gastroesophageal reflux disease)     Past Surgical History  Procedure Date  . Cholecystectomy   . Back surgery   . Joint replacement   . Abdominal hysterectomy   . Thyroidectomy   . Rotator cuff     No family history on file.  History  Substance Use Topics  . Smoking status: Current Everyday Smoker -- 1.0 packs/day    Types: Cigarettes  . Smokeless tobacco: Not on file  . Alcohol Use: No     occasional    OB History    Grav Para Term Preterm Abortions TAB SAB Ect Mult Living                  Review of Systems  Constitutional: Negative.  Negative for fever.  Gastrointestinal: Positive for abdominal pain.  Genitourinary: Positive for dysuria, urgency and frequency. Negative for flank  pain, vaginal bleeding, vaginal discharge and vaginal pain.  Musculoskeletal: Positive for back pain.    Allergies  Review of patient's allergies indicates no known allergies.  Home Medications   Current Outpatient Rx  Name Route Sig Dispense Refill  . NEXIUM PO Oral Take by mouth.      . LEVOXYL PO Oral Take by mouth.      Marland Kitchen SINGULAIR PO Oral Take by mouth.      Marland Kitchen PRESCRIPTION MEDICATION  BP medication       BP 123/75  Pulse 72  Temp(Src) 98.2 F (36.8 C) (Oral)  Resp 16  SpO2 100%  Physical Exam  Nursing note and vitals reviewed. Constitutional: She appears well-developed and well-nourished.  Abdominal: Soft. Bowel sounds are normal. She exhibits no distension and no mass. There is no tenderness. There is no rebound and no guarding.  Skin: Skin is warm and dry.    ED Course  Procedures (including critical care time)   Labs Reviewed  POCT URINALYSIS DIPSTICK   No results found.   No diagnosis found.    MDM  U/a neg        Barkley Bruns, MD 07/10/11 2131

## 2011-07-10 NOTE — ED Notes (Signed)
C/o low abdominal pressure, low back pain and urinary urgency for one week- states sx getting worse

## 2011-07-12 ENCOUNTER — Other Ambulatory Visit: Payer: Self-pay | Admitting: Gastroenterology

## 2011-07-12 ENCOUNTER — Encounter (HOSPITAL_COMMUNITY): Payer: Self-pay | Admitting: Physical Medicine and Rehabilitation

## 2011-07-12 ENCOUNTER — Emergency Department (HOSPITAL_COMMUNITY)
Admission: EM | Admit: 2011-07-12 | Discharge: 2011-07-13 | Disposition: A | Discharge status: Home / Self Care | Payer: Medicare Other | Attending: Emergency Medicine | Admitting: Emergency Medicine

## 2011-07-12 ENCOUNTER — Ambulatory Visit
Admission: RE | Admit: 2011-07-12 | Discharge: 2011-07-12 | Disposition: A | Discharge status: Home / Self Care | Payer: Medicare Other | Source: Ambulatory Visit | Attending: Gastroenterology | Admitting: Gastroenterology

## 2011-07-12 DIAGNOSIS — R110 Nausea: Secondary | ICD-10-CM | POA: Insufficient documentation

## 2011-07-12 DIAGNOSIS — R10819 Abdominal tenderness, unspecified site: Secondary | ICD-10-CM | POA: Insufficient documentation

## 2011-07-12 DIAGNOSIS — R1032 Left lower quadrant pain: Secondary | ICD-10-CM

## 2011-07-12 DIAGNOSIS — F17200 Nicotine dependence, unspecified, uncomplicated: Secondary | ICD-10-CM | POA: Insufficient documentation

## 2011-07-12 DIAGNOSIS — I10 Essential (primary) hypertension: Secondary | ICD-10-CM | POA: Insufficient documentation

## 2011-07-12 DIAGNOSIS — K859 Acute pancreatitis without necrosis or infection, unspecified: Secondary | ICD-10-CM | POA: Insufficient documentation

## 2011-07-12 DIAGNOSIS — K219 Gastro-esophageal reflux disease without esophagitis: Secondary | ICD-10-CM | POA: Insufficient documentation

## 2011-07-12 DIAGNOSIS — J45909 Unspecified asthma, uncomplicated: Secondary | ICD-10-CM | POA: Insufficient documentation

## 2011-07-12 DIAGNOSIS — Z79899 Other long term (current) drug therapy: Secondary | ICD-10-CM | POA: Insufficient documentation

## 2011-07-12 DIAGNOSIS — Z9889 Other specified postprocedural states: Secondary | ICD-10-CM | POA: Insufficient documentation

## 2011-07-12 DIAGNOSIS — R350 Frequency of micturition: Secondary | ICD-10-CM | POA: Insufficient documentation

## 2011-07-12 DIAGNOSIS — R1013 Epigastric pain: Secondary | ICD-10-CM | POA: Insufficient documentation

## 2011-07-12 LAB — COMPREHENSIVE METABOLIC PANEL
ALT: 11 U/L (ref 0–35)
CO2: 29 mEq/L (ref 19–32)
Calcium: 8.8 mg/dL (ref 8.4–10.5)
Creatinine, Ser: 0.62 mg/dL (ref 0.50–1.10)
GFR calc Af Amer: >90 mL/min (ref >90)
GFR calc non Af Amer: >90 mL/min (ref >90)
Glucose, Bld: 104 mg/dL — ABNORMAL HIGH (ref 70–99)
Sodium: 139 mEq/L (ref 135–145)

## 2011-07-12 LAB — URINALYSIS, ROUTINE W REFLEX MICROSCOPIC
Glucose, UA: NEGATIVE mg/dL
Hgb urine dipstick: NEGATIVE
Specific Gravity, Urine: >1.046 — ABNORMAL HIGH (ref 1.005–1.030)

## 2011-07-12 LAB — CBC
HCT: 36.1 % (ref 36.0–46.0)
Hemoglobin: 12.1 g/dL (ref 12.0–15.0)
MCV: 80.4 fL (ref 78.0–100.0)
RBC: 4.49 MIL/uL (ref 3.87–5.11)
WBC: 7.6 10*3/uL (ref 4.0–10.5)

## 2011-07-12 LAB — DIFFERENTIAL
Eosinophils Relative: 4 % (ref 0–5)
Lymphocytes Relative: 31 % (ref 12–46)
Lymphs Abs: 2.4 10*3/uL (ref 0.7–4.0)
Monocytes Absolute: 0.6 10*3/uL (ref 0.1–1.0)

## 2011-07-12 MED ORDER — IOHEXOL 300 MG/ML  SOLN
100.0000 mL | Freq: Once | INTRAMUSCULAR | Status: AC | PRN
  Administered 2011-07-12: 100 mL via INTRAVENOUS

## 2011-07-12 MED ORDER — ONDANSETRON HCL 4 MG/2ML IJ SOLN
4.0000 mg | Freq: Once | INTRAMUSCULAR | Status: AC
  Administered 2011-07-12: 4 mg via INTRAVENOUS
  Filled 2011-07-12: qty 2

## 2011-07-12 MED ORDER — SODIUM CHLORIDE 0.9 % IV BOLUS (SEPSIS)
1000.0000 mL | Freq: Once | INTRAVENOUS | Status: AC
  Administered 2011-07-12: 1000 mL via INTRAVENOUS

## 2011-07-12 MED ORDER — HYDROMORPHONE HCL PF 1 MG/ML IJ SOLN
1.0000 mg | Freq: Once | INTRAMUSCULAR | Status: AC
  Administered 2011-07-12: 1 mg via INTRAVENOUS
  Filled 2011-07-12: qty 1

## 2011-07-12 NOTE — ED Notes (Signed)
Pt presents to department for evaluation of lower abdominal pain, urinary urgency and nausea. Ongoing x 1 week. Was seen by Dr. Lona Kettle today and sent to [facility] then referred to ED. 7/10 pain upon arrival. Pt is alert and oriented x4. No signs of distress at the time.

## 2011-07-12 NOTE — ED Provider Notes (Signed)
History     CSN: [id number] Arrival date & time: 07/12/2011  6:53 PM   First MD Initiated Contact with Patient 07/12/11 2109      Chief Complaint  Patient presents with  . Abdominal Pain    (Consider location/radiation/quality/duration/timing/severity/associated sxs/prior treatment) HPI Comments: Patient sent from her GI doctor after outpatient CT showed inflammation around the pancreas.  Patient states she has had epigastric and left sided abdominal pain x 1 week, sharp in nature, exacerbated with laying flat and palpation, with associated nausea. Nausea is worse with eating. Patient has also had urinary frequency.  Denies fevers, vomiting, change in bowel habits, abnormal vaginal discharge or bleeding.  Pt does have occasional periods still, LMP was end of November.  Patient has never had pancreatitis before.  Is s/p cholecystectomy, denies ETOH use.    Patient is a 60 y.o. female presenting with abdominal pain. The history is provided by the patient.  Abdominal Pain The primary symptoms of the illness include abdominal pain.    Past Medical History  Diagnosis Date  . Asthma   . Hypertension   . GERD (gastroesophageal reflux disease)     Past Surgical History  Procedure Date  . Cholecystectomy   . Back surgery   . Joint replacement   . Abdominal hysterectomy   . Thyroidectomy   . Rotator cuff     History reviewed. No pertinent family history.  History  Substance Use Topics  . Smoking status: Current Everyday Smoker -- 1.0 packs/day    Types: Cigarettes  . Smokeless tobacco: Not on file  . Alcohol Use: No     occasional    OB History    Grav Para Term Preterm Abortions TAB SAB Ect Mult Living                  Review of Systems  Gastrointestinal: Positive for abdominal pain.  All other systems reviewed and are negative.    Allergies  Review of patient's allergies indicates no known allergies.  Home Medications   Current Outpatient Rx  Name Route Sig  Dispense Refill  . CEPHALEXIN 500 MG PO CAPS Oral Take 1 capsule (500 mg total) by mouth 4 (four) times daily. Take all of medicine and drink lots of fluids 20 capsule 0  . DICLOFENAC SODIUM 1 % TD GEL Topical Apply 1 application topically 3 (three) times daily as needed. For pain     . NEXIUM PO Oral Take 40 mg by mouth daily.     Marland Kitchen HYDROCODONE-ACETAMINOPHEN 5-325 MG PO TABS Oral Take 1-2 tablets by mouth every 8 (eight) hours as needed. For pain     . LEVOXYL PO Oral Take 75 mg by mouth daily.     Marland Kitchen SINGULAIR PO Oral Take 10 mg by mouth daily.     Marland Kitchen PREDNISOLONE 5 MG PO TABS Oral Take 5 mg by mouth daily.      Marland Kitchen SPIRONOLACTONE 25 MG PO TABS Oral Take 25 mg by mouth 2 (two) times daily.        BP 141/79  Pulse 75  Temp(Src) 98.7 F (37.1 C) (Oral)  Resp 18  SpO2 100%  Physical Exam  Nursing note and vitals reviewed. Constitutional: She is oriented to person, place, and time. She appears well-developed and well-nourished.  HENT:  Head: Normocephalic and atraumatic.  Neck: Neck supple.  Cardiovascular: Normal rate, regular rhythm and normal heart sounds.   Pulmonary/Chest: Breath sounds normal. No respiratory distress. She has no wheezes. She  has no rales. She exhibits no tenderness.  Abdominal: Soft. Bowel sounds are normal. She exhibits no distension and no mass. There is tenderness. There is no rebound, no guarding and no CVA tenderness.    Neurological: She is alert and oriented to person, place, and time.    ED Course  Procedures (including critical care time)  Labs Reviewed  URINALYSIS, ROUTINE W REFLEX MICROSCOPIC - Abnormal; Notable for the following:    Specific Gravity, Urine >1.046 (*)    All other components within normal limits  COMPREHENSIVE METABOLIC PANEL - Abnormal; Notable for the following:    Glucose, Bld 104 (*)    All other components within normal limits  CBC  DIFFERENTIAL  LIPASE, BLOOD   Ct Abdomen Pelvis W Contrast  07/12/2011  **ADDENDUM**  CREATED: 07/12/2011 17:44:12  Findings discussed with and acknowledged by Dr. Madilyn Fireman on 07/11/2011 at 1740 hrs.  **END ADDENDUM** SIGNED BY: Charline Bills, M.D.   07/12/2011  *RADIOLOGY REPORT*  Clinical Data: Left lower quadrant pain, evaluate for diverticulitis  CT ABDOMEN AND PELVIS WITH CONTRAST  Technique:  Multidetector CT imaging of the abdomen and pelvis was performed following the standard protocol during bolus administration of intravenous contrast.  Contrast: OMNIPAQUE IOHEXOL 300 MG/ML IV SOLN  Comparison: 09/08/2007  Findings: Mild dependent atelectasis at the right lung base with trace bilateral pleural effusions.  Liver, spleen, and adrenal glands within normal limits.  Mild peripancreatic stranding surrounding the pancreatic tail (series 2/image 24).  Status post cholecystectomy.  No intrahepatic or extrahepatic ductal dilatation.  Kidneys are within normal limits.  No hydronephrosis.  No evidence of bowel obstruction.  Normal appendix.  No colonic wall thickening or inflammatory changes.  Atherosclerotic calcifications of the abdominal aorta and branch vessels.  No abdominopelvic ascites.  No suspicious abdominopelvic lymphadenopathy.  This hysterectomy.  No adnexal masses.  Bladder is within normal limits.  Status post PLIF at L4-5.  IMPRESSION: Mild peripancreatic stranding surrounding the pancreatic tail, correlate for pancreatitis.  No colonic wall thickening or inflammatory changes. Original Report Authenticated By: Charline Bills, M.D.   10:56 PM Pt discussed with Dr Weldon Inches.   11:19 PM Patient with pancreatitis.  Just getting pain medication now.  Will do PO trial.  Patient has never vomited, anticipate will tolerate fluids and may be d/c home if able to control pain in ED.   12:05 AM Pain is improving, now a 4/10, nausea continues.    12:43 AM Patient is tolerating PO.    1:05 AM Patient sleeping soundly, plan is for d/c home.    1. Pancreatitis       MDM    Patient with acute pancreatitis, sees Dr Bosie Clos as an outpatient.  Tolerating fluids, pain improved with medications.          Dillard Cannon [place], [place] 07/13/11 0120

## 2011-07-13 ENCOUNTER — Inpatient Hospital Stay (HOSPITAL_COMMUNITY)
Admission: EM | Admit: 2011-07-13 | Discharge: 2011-07-15 | DRG: 440 | Disposition: A | Discharge status: Home / Self Care | Payer: Medicare Other | Attending: Internal Medicine | Admitting: Internal Medicine

## 2011-07-13 ENCOUNTER — Encounter (HOSPITAL_COMMUNITY): Payer: Self-pay | Admitting: Emergency Medicine

## 2011-07-13 DIAGNOSIS — L299 Pruritus, unspecified: Secondary | ICD-10-CM

## 2011-07-13 DIAGNOSIS — J4489 Other specified chronic obstructive pulmonary disease: Secondary | ICD-10-CM | POA: Diagnosis present

## 2011-07-13 DIAGNOSIS — E039 Hypothyroidism, unspecified: Secondary | ICD-10-CM | POA: Diagnosis present

## 2011-07-13 DIAGNOSIS — I10 Essential (primary) hypertension: Secondary | ICD-10-CM | POA: Diagnosis present

## 2011-07-13 DIAGNOSIS — K859 Acute pancreatitis without necrosis or infection, unspecified: Principal | ICD-10-CM | POA: Diagnosis present

## 2011-07-13 DIAGNOSIS — J449 Chronic obstructive pulmonary disease, unspecified: Secondary | ICD-10-CM | POA: Diagnosis present

## 2011-07-13 HISTORY — DX: Acute pancreatitis without necrosis or infection, unspecified: K85.90

## 2011-07-13 LAB — CBC
HCT: 35.6 % — ABNORMAL LOW (ref 36.0–46.0)
HCT: 33.5 % — ABNORMAL LOW (ref 36.0–46.0)
Hemoglobin: 12 g/dL (ref 12.0–15.0)
Hemoglobin: 11.1 g/dL — ABNORMAL LOW (ref 12.0–15.0)
MCH: 27 pg (ref 26.0–34.0)
MCHC: 33.1 g/dL (ref 30.0–36.0)
MCV: 81.3 fL (ref 78.0–100.0)
MCV: 81.5 fL (ref 78.0–100.0)
Platelets: 298 10*3/uL (ref 150–400)
RBC: 4.38 MIL/uL (ref 3.87–5.11)
RBC: 4.11 MIL/uL (ref 3.87–5.11)
RDW: 15.3 % (ref 11.5–15.5)
WBC: 8.1 10*3/uL (ref 4.0–10.5)
WBC: 6.4 10*3/uL (ref 4.0–10.5)

## 2011-07-13 LAB — COMPREHENSIVE METABOLIC PANEL
Albumin: 3.7 g/dL (ref 3.5–5.2)
Alkaline Phosphatase: 99 U/L (ref 39–117)
BUN: 6 mg/dL (ref 6–23)
Calcium: 9 mg/dL (ref 8.4–10.5)
Potassium: 3.6 mEq/L (ref 3.5–5.1)
Total Protein: 8 g/dL (ref 6.0–8.3)

## 2011-07-13 LAB — CREATININE, SERUM
Creatinine, Ser: 0.65 mg/dL (ref 0.50–1.10)
GFR calc Af Amer: >90 mL/min (ref >90)
GFR calc non Af Amer: >90 mL/min (ref >90)

## 2011-07-13 MED ORDER — PERCOCET 5-325 MG PO TABS: 1.0000 | ORAL_TABLET | Freq: Four times a day (QID) | ORAL | Status: DC | PRN

## 2011-07-13 MED ORDER — HYDROMORPHONE HCL PF 1 MG/ML IJ SOLN
1.0000 mg | Freq: Once | INTRAMUSCULAR | Status: AC
  Administered 2011-07-13: 1 mg via INTRAVENOUS
  Filled 2011-07-13: qty 1

## 2011-07-13 MED ORDER — LEVOTHYROXINE SODIUM 75 MCG PO TABS
75.0000 ug | ORAL_TABLET | Freq: Every day | ORAL | Status: DC
  Administered 2011-07-14 – 2011-07-15 (×2): 75 ug via ORAL
  Filled 2011-07-13 (×3): qty 1

## 2011-07-13 MED ORDER — PROMETHAZINE HCL 25 MG/ML IJ SOLN
25.0000 mg | INTRAMUSCULAR | Status: AC
  Administered 2011-07-13: 25 mg via INTRAVENOUS
  Filled 2011-07-13: qty 1

## 2011-07-13 MED ORDER — ALBUTEROL SULFATE (5 MG/ML) 0.5% IN NEBU
2.5000 mg | INHALATION_SOLUTION | Freq: Four times a day (QID) | RESPIRATORY_TRACT | Status: DC
  Administered 2011-07-14: 2.5 mg via RESPIRATORY_TRACT
  Filled 2011-07-13: qty 0.5

## 2011-07-13 MED ORDER — ENOXAPARIN SODIUM 40 MG/0.4ML ~~LOC~~ SOLN
40.0000 mg | Freq: Every day | SUBCUTANEOUS | Status: DC
  Administered 2011-07-13 – 2011-07-14 (×2): 40 mg via SUBCUTANEOUS
  Filled 2011-07-13 (×3): qty 0.4

## 2011-07-13 MED ORDER — PROMETHAZINE HCL 25 MG/ML IJ SOLN
25.0000 mg | Freq: Four times a day (QID) | INTRAMUSCULAR | Status: DC | PRN
  Filled 2011-07-13: qty 1

## 2011-07-13 MED ORDER — ALBUTEROL SULFATE (5 MG/ML) 0.5% IN NEBU: 2.5000 mg | INHALATION_SOLUTION | RESPIRATORY_TRACT | Status: DC | PRN

## 2011-07-13 MED ORDER — KCL IN DEXTROSE-NACL 20-5-0.9 MEQ/L-%-% IV SOLN
INTRAVENOUS | Status: DC
  Administered 2011-07-13 – 2011-07-15 (×4): via INTRAVENOUS
  Filled 2011-07-13 (×9): qty 1000

## 2011-07-13 MED ORDER — PANTOPRAZOLE SODIUM 40 MG IV SOLR
40.0000 mg | Freq: Every day | INTRAVENOUS | Status: DC
  Administered 2011-07-13 – 2011-07-14 (×2): 40 mg via INTRAVENOUS
  Filled 2011-07-13 (×4): qty 40

## 2011-07-13 MED ORDER — PROMETHAZINE HCL 25 MG PO TABS: 25.0000 mg | ORAL_TABLET | Freq: Three times a day (TID) | ORAL | Status: DC | PRN

## 2011-07-13 MED ORDER — ONDANSETRON HCL 4 MG/2ML IJ SOLN
4.0000 mg | Freq: Once | INTRAMUSCULAR | Status: AC
  Administered 2011-07-13: 4 mg via INTRAVENOUS
  Filled 2011-07-13: qty 2

## 2011-07-13 MED ORDER — IPRATROPIUM BROMIDE 0.02 % IN SOLN
0.5000 mg | Freq: Four times a day (QID) | RESPIRATORY_TRACT | Status: DC
  Administered 2011-07-14: 0.5 mg via RESPIRATORY_TRACT
  Filled 2011-07-13: qty 2.5

## 2011-07-13 MED ORDER — PANTOPRAZOLE SODIUM 40 MG IV SOLR
40.0000 mg | Freq: Once | INTRAVENOUS | Status: AC
  Administered 2011-07-13: 40 mg via INTRAVENOUS
  Filled 2011-07-13: qty 40

## 2011-07-13 MED ORDER — SODIUM CHLORIDE 0.9 % IV BOLUS (SEPSIS)
1000.0000 mL | Freq: Once | INTRAVENOUS | Status: AC
  Administered 2011-07-13: 1000 mL via INTRAVENOUS

## 2011-07-13 MED ORDER — HYDROMORPHONE HCL PF 1 MG/ML IJ SOLN
1.0000 mg | INTRAMUSCULAR | Status: DC | PRN
  Administered 2011-07-14 – 2011-07-15 (×7): 1 mg via INTRAVENOUS
  Filled 2011-07-13 (×7): qty 1

## 2011-07-13 NOTE — H&P (Signed)
Hospital Admission Note Date: 07/13/2011  Patient name: Tina Neal Medical record number: [id number] Date of birth: 08-20-1950 Age: 60 y.o. Gender: female PCP: Pearla Dubonnet, MD, MD  Chief Complaint: abdominal pain, unrelenting  History of Present Illness: 60 yo w back pain x 2 weeks comes back to ED today because of unrelenting pain.  She was seen by Dr Bosie Clos (GI) for pain and CT was ordered.  He called her and told her to go to the ED when he got the results.  The patient sent several hours in the ED and eventually was discharged with pain medication and told to take clear liquid diet, but the pain continued to be severe despite taking her pain meds and following the diet.  She went to urgent care 3 days ago thinking it was a UTI, but urine was negative.  So this is her fourth visit for the pain.  No history of pancreatitis.  No alcohol and she has history of chole several years ago.  Her HCTZ was discontinued a few weeks ago because of rash.  She was already having abdominal pain at that time.  Meds: Medications Prior to Admission  Medication Dose Route Frequency Provider Last Rate Last Dose  . HYDROmorphone (DILAUDID) injection 1 mg  1 mg Intravenous Once Rise Patience, PA   1 mg at 07/12/11 2324  . HYDROmorphone (DILAUDID) injection 1 mg  1 mg Intravenous Once Rise Patience, PA   1 mg at 07/13/11 0032  . HYDROmorphone (DILAUDID) injection 1 mg  1 mg Intravenous Once Suzi Roots, MD   1 mg at 07/13/11 1946  . iohexol (OMNIPAQUE) 300 MG/ML solution 100 mL  100 mL Intravenous Once PRN Medication Radiologist   100 mL at 07/12/11 1724  . ondansetron (ZOFRAN) injection 4 mg  4 mg Intravenous Once Rise Patience, PA   4 mg at 07/12/11 2324  . ondansetron (ZOFRAN) injection 4 mg  4 mg Intravenous Once Suzi Roots, MD   4 mg at 07/13/11 1945  . pantoprazole (PROTONIX) injection 40 mg  40 mg Intravenous Once Suzi Roots, MD   40 mg at 07/13/11 1949  . promethazine (PHENERGAN)  injection 25 mg  25 mg Intravenous To Major Colleen Can, PHARMD   25 mg at 07/13/11 0031  . sodium chloride 0.9 % bolus 1,000 mL  1,000 mL Intravenous Once Rise Patience, PA   1,000 mL at 07/12/11 2324  . sodium chloride 0.9 % bolus 1,000 mL  1,000 mL Intravenous Once Suzi Roots, MD   1,000 mL at 07/13/11 1943  . DISCONTD: promethazine (PHENERGAN) injection 25 mg  25 mg Intravenous Q6H PRN Rise Patience, PA       Medications Prior to Admission  Medication Sig Dispense Refill  . diclofenac sodium (VOLTAREN) 1 % GEL Apply 1 application topically 3 (three) times daily as needed. For pain       . Esomeprazole Magnesium (NEXIUM PO) Take 40 mg by mouth daily.       Marland Kitchen HYDROcodone-acetaminophen (NORCO) 5-325 MG per tablet Take 1-2 tablets by mouth every 8 (eight) hours as needed. For pain       . Levothyroxine Sodium (LEVOXYL PO) Take 75 mcg by mouth daily.       . Montelukast Sodium (SINGULAIR PO) Take 10 mg by mouth daily.       Marland Kitchen spironolactone (ALDACTONE) 25 MG tablet Take 25 mg by mouth 2 (two) times daily.  Allergies: Hctz Past Medical History  Diagnosis Date  . Asthma   . Hypertension   . GERD (gastroesophageal reflux disease)   . Pancreatitis    Past Surgical History  Procedure Date  . Cholecystectomy   . Back surgery   . Joint replacement   . Abdominal hysterectomy   . Thyroidectomy   . Rotator cuff    History reviewed. No pertinent family history. History   Social History  . Marital Status: Married    Spouse Name: N/A    Number of Children: N/A  . Years of Education: N/A   Occupational History  . Not on file.   Social History Main Topics  . Smoking status: Current Everyday Smoker -- 1.0 packs/day    Types: Cigarettes  . Smokeless tobacco: Not on file  . Alcohol Use: No     occasional  . Drug Use: No  . Sexually Active: Yes    Birth Control/ Protection: None   Other Topics Concern  . Not on file   Social History Narrative  . No narrative  on file    Review of Systems: Review of Systems  Constitutional: Negative for fever, chills, weight loss, malaise/fatigue and diaphoresis.  HENT: Negative for ear pain, nosebleeds, sore throat, neck pain, tinnitus and ear discharge.   Eyes: Negative for blurred vision, photophobia, pain, discharge and redness.  Respiratory: Negative for hemoptysis.   Cardiovascular: Negative for chest pain, palpitations, orthopnea, leg swelling and PND.  Gastrointestinal: Positive for nausea, abdominal pain and blood in stool. Negative for vomiting, diarrhea and constipation.       1 episode of blood in stool several weeks ago.  None since. Her mid back is the place on the worst pain.  Genitourinary: Positive for flank pain. Negative for dysuria, urgency, frequency and hematuria.  Musculoskeletal: Positive for myalgias and back pain. Negative for joint pain.  Skin: Negative for itching and rash.  Neurological: Negative for dizziness, tingling, tremors, sensory change, speech change, focal weakness, seizures, loss of consciousness, weakness and headaches.  Endo/Heme/Allergies: Negative for polydipsia.  Psychiatric/Behavioral: Negative for suicidal ideas, hallucinations, memory loss and substance abuse.     Filed Vitals:   07/13/11 1713  BP: 112/75  Pulse: 86  Temp: 98.1 F (36.7 C)  TempSrc: Oral  Resp: 18  SpO2: 99%    Physical Exam: Physical Exam  Constitutional: She is oriented to person, place, and time. She appears well-developed and well-nourished. No distress.  HENT:  Head: Normocephalic.  Nose: Nose normal.  Mouth/Throat: Oropharynx is clear and moist. No oropharyngeal exudate.  Eyes: Conjunctivae and EOM are normal. Pupils are equal, round, and reactive to light. Right eye exhibits no discharge. Left eye exhibits no discharge. No scleral icterus.  Neck: Normal range of motion. Neck supple. No JVD present. No tracheal deviation present. No thyromegaly present.  Cardiovascular: Normal  rate, regular rhythm, normal heart sounds and intact distal pulses.  Exam reveals no gallop and no friction rub.   No murmur heard. Pulmonary/Chest: Effort normal and breath sounds normal. No stridor. No respiratory distress. She has no wheezes. She has no rales. She exhibits no tenderness.  Abdominal: Soft. Bowel sounds are normal. She exhibits no distension and no mass. There is tenderness. There is no rebound and no guarding.  Musculoskeletal: She exhibits no edema and no tenderness.  Lymphadenopathy:    She has no cervical adenopathy.  Neurological: She is alert and oriented to person, place, and time. No cranial nerve deficit. She exhibits normal muscle  tone. Coordination normal.  Skin: Skin is warm and dry. No rash noted. She is not diaphoretic. No erythema. No pallor.  Psychiatric: She has a normal mood and affect. Her behavior is normal. Judgment and thought content normal.     Lab results: Results for orders placed during the hospital encounter of 07/13/11  COMPREHENSIVE METABOLIC PANEL      Component Value Range   Sodium 139  135 - 145 (mEq/L)   Potassium 3.6  3.5 - 5.1 (mEq/L)   Chloride 103  96 - 112 (mEq/L)   CO2 28  19 - 32 (mEq/L)   Glucose, Bld 90  70 - 99 (mg/dL)   BUN 6  6 - 23 (mg/dL)   Creatinine, Ser 1.61  0.50 - 1.10 (mg/dL)   Calcium 9.0  8.4 - 09.6 (mg/dL)   Total Protein 8.0  6.0 - 8.3 (g/dL)   Albumin 3.7  3.5 - 5.2 (g/dL)   AST 15  0 - 37 (U/L)   ALT 11  0 - 35 (U/L)   Alkaline Phosphatase 99  39 - 117 (U/L)   Total Bilirubin 0.3  0.3 - 1.2 (mg/dL)   GFR calc non Af Amer >90  >90 (mL/min)   GFR calc Af Amer >90  >90 (mL/min)  CBC      Component Value Range   WBC 8.1  4.0 - 10.5 (K/uL)   RBC 4.38  3.87 - 5.11 (MIL/uL)   Hemoglobin 12.0  12.0 - 15.0 (g/dL)   HCT 04.5 (*) 40.9 - 46.0 (%)   MCV 81.3  78.0 - 100.0 (fL)   MCH 27.4  26.0 - 34.0 (pg)   MCHC 33.7  30.0 - 36.0 (g/dL)   RDW 81.1  91.4 - 78.2 (%)   Platelets 328  150 - 400 (K/uL)  LIPASE,  BLOOD      Component Value Range   Lipase 493 (*) 11 - 59 (U/L)      Assessment & Plan by Problem: #1. Pancreatitis, failed outpatient management -  Will make patient NPO and provide IV pain control.  The cause is unknown.  No alcohol and no gallbladder.  CT reveals normal liver, normal duct sizes.  HCTZ is a possibility.  It was stopped several weeks ago.  Will hold Spirinolacatone and all nonessential meds.  Follow. Consult her gastroenterologist. #2.  HTN-  Holding Spirinolactone which was her only BP med.  Will follow #3.  COPD- no wheezing presently.  Follow   Code status: full  Greater than 70 minutes was spent on direct patient care and coordination of care.  Please fax this note to primary provider  Mry Lamia 07/13/2011, 9:19 PM

## 2011-07-13 NOTE — ED Notes (Signed)
Calling report now. 

## 2011-07-13 NOTE — ED Provider Notes (Addendum)
History     CSN: [id number] Arrival date & time: 07/13/2011  5:11 PM   First MD Initiated Contact with Patient 07/13/11 1859      Chief Complaint  Patient presents with  . Abdominal Pain    (Consider location/radiation/quality/duration/timing/severity/associated sxs/prior treatment) Patient is a 60 y.o. female presenting with abdominal pain. The history is provided by the patient.  Abdominal Pain The primary symptoms of the illness include abdominal pain. The primary symptoms of the illness do not include fever or shortness of breath.  Symptoms associated with the illness do not include chills or back pain.  pt c/o epigastric pain for past 2-3 days. Constant. Dull, radiates to back. Worse w palp. Persistent nausea. Intermittent vomiting, not bloody or bilious. Having normal bms. Prior abd surgery includes hysterx, and had cholecystx a few years ago. Was seen in ed yesterday and dx w pancreatitis. Says home meds not helping pain and nausea persists. No prior hx pancreatitis. No etoh use/abuse. Denies recent febrile or viral illness. No hx pud. Denies fever or chills.   Past Medical History  Diagnosis Date  . Asthma   . Hypertension   . GERD (gastroesophageal reflux disease)   . Pancreatitis     Past Surgical History  Procedure Date  . Cholecystectomy   . Back surgery   . Joint replacement   . Abdominal hysterectomy   . Thyroidectomy   . Rotator cuff     History reviewed. No pertinent family history.  History  Substance Use Topics  . Smoking status: Current Everyday Smoker -- 1.0 packs/day    Types: Cigarettes  . Smokeless tobacco: Not on file  . Alcohol Use: No     occasional    OB History    Grav Para Term Preterm Abortions TAB SAB Ect Mult Living                  Review of Systems  Constitutional: Negative for fever and chills.  HENT: Negative for neck pain.   Eyes: Negative for redness.  Respiratory: Negative for shortness of breath.   Cardiovascular:  Negative for chest pain.  Gastrointestinal: Positive for abdominal pain.  Genitourinary: Negative for flank pain.  Musculoskeletal: Negative for back pain.  Skin: Negative for rash.  Neurological: Negative for headaches.  Hematological: Does not bruise/bleed easily.  Psychiatric/Behavioral: Negative for confusion.    Allergies  Review of patient's allergies indicates no known allergies.  Home Medications   Current Outpatient Rx  Name Route Sig Dispense Refill  . DICLOFENAC SODIUM 1 % TD GEL Topical Apply 1 application topically 3 (three) times daily as needed. For pain     . NEXIUM PO Oral Take 40 mg by mouth daily.     Marland Kitchen HYDROCODONE-ACETAMINOPHEN 5-325 MG PO TABS Oral Take 1-2 tablets by mouth every 8 (eight) hours as needed. For pain     . LEVOXYL PO Oral Take 75 mcg by mouth daily.     Marland Kitchen SINGULAIR PO Oral Take 10 mg by mouth daily.     Marland Kitchen PROMETHAZINE HCL 25 MG PO TABS Oral Take 25 mg by mouth every 8 (eight) hours as needed. For nausea     . SPIRONOLACTONE 25 MG PO TABS Oral Take 25 mg by mouth 2 (two) times daily.        BP 112/75  Pulse 86  Temp(Src) 98.1 F (36.7 C) (Oral)  Resp 18  SpO2 99%  Physical Exam  Nursing note and vitals reviewed. Constitutional: She appears well-developed  and well-nourished. No distress.  Eyes: Conjunctivae are normal. No scleral icterus.  Neck: Neck supple. No tracheal deviation present.  Cardiovascular: Normal rate, regular rhythm, normal heart sounds and intact distal pulses.   Pulmonary/Chest: Effort normal and breath sounds normal. No respiratory distress. She has no rales.  Abdominal: Soft. Normal appearance. She exhibits no distension and no mass. There is tenderness. There is no rebound and no guarding.       Epigastric tenderness. No rebound or guarding.   Musculoskeletal: She exhibits no edema.  Neurological: She is alert.  Skin: Skin is warm and dry. No rash noted.  Psychiatric: She has a normal mood and affect.    ED Course   Procedures (including critical care time)  Labs Reviewed  CBC - Abnormal; Notable for the following:    HCT 35.6 (*)    All other components within normal limits  LIPASE, BLOOD - Abnormal; Notable for the following:    Lipase 493 (*)    All other components within normal limits  COMPREHENSIVE METABOLIC PANEL   Ct Abdomen Pelvis W Contrast  07/12/2011  **ADDENDUM** CREATED: 07/12/2011 17:44:12  Findings discussed with and acknowledged by Dr. Madilyn Fireman on 07/11/2011 at 1740 hrs.  **END ADDENDUM** SIGNED BY: Charline Bills, M.D.   07/12/2011  *RADIOLOGY REPORT*  Clinical Data: Left lower quadrant pain, evaluate for diverticulitis  CT ABDOMEN AND PELVIS WITH CONTRAST  Technique:  Multidetector CT imaging of the abdomen and pelvis was performed following the standard protocol during bolus administration of intravenous contrast.  Contrast: OMNIPAQUE IOHEXOL 300 MG/ML IV SOLN  Comparison: 09/08/2007  Findings: Mild dependent atelectasis at the right lung base with trace bilateral pleural effusions.  Liver, spleen, and adrenal glands within normal limits.  Mild peripancreatic stranding surrounding the pancreatic tail (series 2/image 24).  Status post cholecystectomy.  No intrahepatic or extrahepatic ductal dilatation.  Kidneys are within normal limits.  No hydronephrosis.  No evidence of bowel obstruction.  Normal appendix.  No colonic wall thickening or inflammatory changes.  Atherosclerotic calcifications of the abdominal aorta and branch vessels.  No abdominopelvic ascites.  No suspicious abdominopelvic lymphadenopathy.  This hysterectomy.  No adnexal masses.  Bladder is within normal limits.  Status post PLIF at L4-5.  IMPRESSION: Mild peripancreatic stranding surrounding the pancreatic tail, correlate for pancreatitis.  No colonic wall thickening or inflammatory changes. Original Report Authenticated By: Charline Bills, M.D.    Results for orders placed during the hospital encounter of 07/13/11    COMPREHENSIVE METABOLIC PANEL      Component Value Range   Sodium 139  135 - 145 (mEq/L)   Potassium 3.6  3.5 - 5.1 (mEq/L)   Chloride 103  96 - 112 (mEq/L)   CO2 28  19 - 32 (mEq/L)   Glucose, Bld 90  70 - 99 (mg/dL)   BUN 6  6 - 23 (mg/dL)   Creatinine, Ser 1.61  0.50 - 1.10 (mg/dL)   Calcium 9.0  8.4 - 09.6 (mg/dL)   Total Protein 8.0  6.0 - 8.3 (g/dL)   Albumin 3.7  3.5 - 5.2 (g/dL)   AST 15  0 - 37 (U/L)   ALT 11  0 - 35 (U/L)   Alkaline Phosphatase 99  39 - 117 (U/L)   Total Bilirubin 0.3  0.3 - 1.2 (mg/dL)   GFR calc non Af Amer >90  >90 (mL/min)   GFR calc Af Amer >90  >90 (mL/min)  CBC      Component Value  Range   WBC 8.1  4.0 - 10.5 (K/uL)   RBC 4.38  3.87 - 5.11 (MIL/uL)   Hemoglobin 12.0  12.0 - 15.0 (g/dL)   HCT 16.1 (*) 09.6 - 46.0 (%)   MCV 81.3  78.0 - 100.0 (fL)   MCH 27.4  26.0 - 34.0 (pg)   MCHC 33.7  30.0 - 36.0 (g/dL)   RDW 04.5  40.9 - 81.1 (%)   Platelets 328  150 - 400 (K/uL)  LIPASE, BLOOD      Component Value Range   Lipase 493 (*) 11 - 59 (U/L)   Ct Abdomen Pelvis W Contrast  07/12/2011  **ADDENDUM** CREATED: 07/12/2011 17:44:12  Findings discussed with and acknowledged by Dr. Madilyn Fireman on 07/11/2011 at 1740 hrs.  **END ADDENDUM** SIGNED BY: Charline Bills, M.D.   07/12/2011  *RADIOLOGY REPORT*  Clinical Data: Left lower quadrant pain, evaluate for diverticulitis  CT ABDOMEN AND PELVIS WITH CONTRAST  Technique:  Multidetector CT imaging of the abdomen and pelvis was performed following the standard protocol during bolus administration of intravenous contrast.  Contrast: OMNIPAQUE IOHEXOL 300 MG/ML IV SOLN  Comparison: 09/08/2007  Findings: Mild dependent atelectasis at the right lung base with trace bilateral pleural effusions.  Liver, spleen, and adrenal glands within normal limits.  Mild peripancreatic stranding surrounding the pancreatic tail (series 2/image 24).  Status post cholecystectomy.  No intrahepatic or extrahepatic ductal dilatation.   Kidneys are within normal limits.  No hydronephrosis.  No evidence of bowel obstruction.  Normal appendix.  No colonic wall thickening or inflammatory changes.  Atherosclerotic calcifications of the abdominal aorta and branch vessels.  No abdominopelvic ascites.  No suspicious abdominopelvic lymphadenopathy.  This hysterectomy.  No adnexal masses.  Bladder is within normal limits.  Status post PLIF at L4-5.  IMPRESSION: Mild peripancreatic stranding surrounding the pancreatic tail, correlate for pancreatitis.  No colonic wall thickening or inflammatory changes. Original Report Authenticated By: Charline Bills, M.D.        MDM  Reviewed nursing notes and recent labs.  Iv ns dilaudid 1 mg iv. zofran iv. protonix iv.    Labs pending. Discussed w [facility] including plan for check labs when back, admit.      Suzi Roots, MD 07/13/11 1946  Suzi Roots, MD 07/13/11 2002

## 2011-07-13 NOTE — ED Notes (Signed)
Patient currently sitting up in bed; no respiratory or acute distress noted.  Updated patient on plan of care; informed patient that a bed is available and report is being called.  Patient has no other questions or concerns at this time; will continue to monitor.

## 2011-07-13 NOTE — ED Notes (Signed)
Patient is resting comfortably. 

## 2011-07-13 NOTE — ED Notes (Signed)
PT ambulated with a steady gait; VSS; no acute signs of distress; pt reported she will follow d/c instructions and take prescriptions as prescribed.

## 2011-07-13 NOTE — ED Notes (Signed)
Attempted to give report to CDU; CDU states that they are not able to take patient at this time.

## 2011-07-13 NOTE — ED Notes (Signed)
Attempted to call report; was told that the nurse is currently busy in a room giving pain medications and would call me back in 5 minutes.  Left name and number for nurse.

## 2011-07-13 NOTE — ED Notes (Signed)
Patient currently sitting up in bed talking on the phone; no respiratory or acute distress noted.  Will continue to monitor.

## 2011-07-13 NOTE — ED Notes (Signed)
Pt reports seen here last night and was told it was her pancreatitis. Prescribed medications not helping at this time.

## 2011-07-13 NOTE — ED Notes (Signed)
Admitting MD at bedside.

## 2011-07-13 NOTE — ED Notes (Signed)
PT reporting no pain; able to hold down Sprite with no nausea.

## 2011-07-13 NOTE — ED Notes (Signed)
Received bedside report from [place], Charity fundraiser.  Patient currently sitting up in bed; no respiratory or acute distress noted.  Patient updated on plan of care; informed patient that we are currently waiting on EDP to write orders for [facility] observation.  Patient currently has no questions or concerns at this time.  Will continue to monitor.

## 2011-07-13 NOTE — ED Notes (Signed)
Iv d/c.

## 2011-07-13 NOTE — ED Notes (Signed)
Patient being transported to [id number] with Sandi Raveling, Char[employer] fundraiser.

## 2011-07-13 NOTE — ED Provider Notes (Signed)
Medical screening examination/treatment/procedure(s) were performed by non-physician practitioner and as supervising physician I was immediately available for consultation/collaboration.  Rianna Lukes P Staisha Winiarski, MD 07/13/11 1506 

## 2011-07-14 LAB — BASIC METABOLIC PANEL
BUN: 5 mg/dL — ABNORMAL LOW (ref 6–23)
CO2: 29 mEq/L (ref 19–32)
Calcium: 7.7 mg/dL — ABNORMAL LOW (ref 8.4–10.5)
Chloride: 108 mEq/L (ref 96–112)
Creatinine, Ser: 0.66 mg/dL (ref 0.50–1.10)
GFR calc Af Amer: >90 mL/min (ref >90)
GFR calc non Af Amer: >90 mL/min (ref >90)
Glucose, Bld: 90 mg/dL (ref 70–99)
Potassium: 3.6 mEq/L (ref 3.5–5.1)
Sodium: 143 mEq/L (ref 135–145)

## 2011-07-14 LAB — CBC
HCT: 31.8 % — ABNORMAL LOW (ref 36.0–46.0)
Hemoglobin: 10.5 g/dL — ABNORMAL LOW (ref 12.0–15.0)
MCH: 27 pg (ref 26.0–34.0)
MCHC: 33 g/dL (ref 30.0–36.0)
MCV: 81.7 fL (ref 78.0–100.0)
Platelets: 280 10*3/uL (ref 150–400)
RBC: 3.89 MIL/uL (ref 3.87–5.11)
RDW: 15.4 % (ref 11.5–15.5)
WBC: 5.4 10*3/uL (ref 4.0–10.5)

## 2011-07-14 LAB — AMYLASE: Amylase: 303 U/L — ABNORMAL HIGH (ref 0–105)

## 2011-07-14 LAB — LIPASE, BLOOD: Lipase: 282 U/L — ABNORMAL HIGH (ref 11–59)

## 2011-07-14 MED ORDER — DIPHENHYDRAMINE HCL 25 MG PO CAPS
25.0000 mg | ORAL_CAPSULE | Freq: Four times a day (QID) | ORAL | Status: DC | PRN
  Administered 2011-07-14 – 2011-07-15 (×2): 25 mg via ORAL
  Filled 2011-07-14 (×2): qty 1

## 2011-07-14 MED ORDER — ALBUTEROL SULFATE HFA 108 (90 BASE) MCG/ACT IN AERS
2.0000 | INHALATION_SPRAY | Freq: Four times a day (QID) | RESPIRATORY_TRACT | Status: DC
  Administered 2011-07-14 – 2011-07-15 (×6): 2 via RESPIRATORY_TRACT
  Filled 2011-07-14: qty 6.7

## 2011-07-14 MED ORDER — IPRATROPIUM BROMIDE 0.02 % IN SOLN: 0.5000 mg | Freq: Four times a day (QID) | RESPIRATORY_TRACT | Status: DC | PRN

## 2011-07-14 MED ORDER — ONDANSETRON HCL 4 MG/2ML IJ SOLN
4.0000 mg | Freq: Four times a day (QID) | INTRAMUSCULAR | Status: DC | PRN
  Administered 2011-07-14: 4 mg via INTRAVENOUS
  Filled 2011-07-14: qty 2

## 2011-07-14 NOTE — Progress Notes (Signed)
Subjective: Patient seen and examined this am . informs her abdominal pain to be better. No nausea or vomiting.  Objective:  Vital signs in last 24 hours:  Filed Vitals:   07/13/11 2140 07/14/11 0148 07/14/11 0521 07/14/11 0751  BP: 134/86  95/63   Pulse: 67  69   Temp: 98.1 F (36.7 C)  98.1 F (36.7 C)   TempSrc: Oral  Oral   Resp: 18  18   SpO2: 99% 94% 97% 93%    Intake/Output from previous day:   Intake/Output Summary (Last 24 hours) at 07/14/11 1403 Last data filed at 07/14/11 0700  Gross per 24 hour  Intake 1804.17 ml  Output    250 ml  Net 1554.17 ml    Physical Exam:  General: , in no acute distress. HEENT: no pallor, no icterus, moist oral mucosa, no JVD, no lymphadenopathy Heart: Normal  s1 &s2  Regular rate and rhythm, without murmurs, rubs, gallops. Lungs: Clear to auscultation bilaterally. Abdomen: Soft, , nondistended, moderate tenderness to deep palpation over  epigastric area, positive bowel sounds. Extremities: No clubbing cyanosis or edema with positive pedal pulses. Neuro: Alert, awake, oriented x3, nonfocal.   Lab Results:  Basic Metabolic Panel:    Component Value Date/Time   NA 143 07/14/2011 0545   K 3.6 07/14/2011 0545   CL 108 07/14/2011 0545   CO2 29 07/14/2011 0545   BUN 5* 07/14/2011 0545   CREATININE 0.66 07/14/2011 0545   GLUCOSE 90 07/14/2011 0545   CALCIUM 7.7* 07/14/2011 0545   CBC:    Component Value Date/Time   WBC 5.4 07/14/2011 0545   HGB 10.5* 07/14/2011 0545   HCT 31.8* 07/14/2011 0545   PLT 280 07/14/2011 0545   MCV 81.7 07/14/2011 0545   NEUTROABS 4.3 07/12/2011 2150   LYMPHSABS 2.4 07/12/2011 2150   MONOABS 0.6 07/12/2011 2150   EOSABS 0.3 07/12/2011 2150   BASOSABS 0.1 07/12/2011 2150    No results found for this or any previous visit (from the past 240 hour(s)).  Studies/Results: Ct Abdomen Pelvis W Contrast  07/12/2011  **ADDENDUM** CREATED: 07/12/2011 17:44:12  Findings discussed with and acknowledged by Dr. Madilyn Fireman  on 07/11/2011 at 1740 hrs.  **END ADDENDUM** SIGNED BY: Charline Bills, M.D.   07/12/2011  *RADIOLOGY REPORT*  Clinical Data: Left lower quadrant pain, evaluate for diverticulitis  CT ABDOMEN AND PELVIS WITH CONTRAST  Technique:  Multidetector CT imaging of the abdomen and pelvis was performed following the standard protocol during bolus administration of intravenous contrast.  Contrast: OMNIPAQUE IOHEXOL 300 MG/ML IV SOLN  Comparison: 09/08/2007  Findings: Mild dependent atelectasis at the right lung base with trace bilateral pleural effusions.  Liver, spleen, and adrenal glands within normal limits.  Mild peripancreatic stranding surrounding the pancreatic tail (series 2/image 24).  Status post cholecystectomy.  No intrahepatic or extrahepatic ductal dilatation.  Kidneys are within normal limits.  No hydronephrosis.  No evidence of bowel obstruction.  Normal appendix.  No colonic wall thickening or inflammatory changes.  Atherosclerotic calcifications of the abdominal aorta and branch vessels.  No abdominopelvic ascites.  No suspicious abdominopelvic lymphadenopathy.  This hysterectomy.  No adnexal masses.  Bladder is within normal limits.  Status post PLIF at L4-5.  IMPRESSION: Mild peripancreatic stranding surrounding the pancreatic tail, correlate for pancreatitis.  No colonic wall thickening or inflammatory changes. Original Report Authenticated By: Charline Bills, M.D.    Medications: Scheduled Meds:   . albuterol  2 puff Inhalation QID  . enoxaparin  40 mg Subcutaneous QHS  . HYDROmorphone  1 mg Intravenous Once  . levothyroxine  75 mcg Oral QAC breakfast  . ondansetron (ZOFRAN) IV  4 mg Intravenous Once  . pantoprazole (PROTONIX) IV  40 mg Intravenous Once  . pantoprazole (PROTONIX) IV  40 mg Intravenous QHS  . sodium chloride  1,000 mL Intravenous Once  . DISCONTD: albuterol  2.5 mg Nebulization Q6H  . DISCONTD: ipratropium  0.5 mg Nebulization Q6H   Continuous Infusions:   .  dextrose 5 % and 0.9 % NaCl with KCl 20 mEq/L 125 mL/hr at 07/14/11 0722   PRN Meds:.albuterol, HYDROmorphone, ipratropium  Assessment/Plan:  60 year old female with history of hypertension and COPD, cholecystectomy 2 years ago presented to the ED 2 days ago with epigastric pain with findings of mild pancreatitis on imaging and was discharged home however returned back with the persistence of symptoms.  Plan #1 acute pancreatitis She presented with persistent epigastric pain with elevated lipase and amylase and CT abdomen showing peripancreatic stranding over the tail of the pancreas. He should was seen by her GI Dr. Bosie Clos in the clinic 2 days ago, and since her pain was persistent he recommended her to return to the ED. Her cause for her acute pancreatitis is unclear, her LFTs are normal and patient denies use of alcohol. She is also not on any medications that could potentially contribute to pancreatitis. Patient made n.p.o. Overnight, continue with prn Dilaudid for pain. -Zofran as needed for nausea  -Her pain is much better today. I will start her on clear liquids and slowly advance her diet. -Lipase is trending down. -I will inform Dr Bosie Clos  in the morning  #2 COPD Continue  Nebs  #3 hypertension Currently stable  #4 hypothyroidism cont Synthroid  DVT prophylaxis   LOS: 1 day   Rhyan Wolters 07/14/2011, 2:03 PM

## 2011-07-15 LAB — BASIC METABOLIC PANEL
CO2: 23 mEq/L (ref 19–32)
GFR calc non Af Amer: >90 mL/min (ref >90)
Glucose, Bld: 125 mg/dL — ABNORMAL HIGH (ref 70–99)
Potassium: 3.8 mEq/L (ref 3.5–5.1)
Sodium: 141 mEq/L (ref 135–145)

## 2011-07-15 LAB — CBC
Hemoglobin: 10.3 g/dL — ABNORMAL LOW (ref 12.0–15.0)
MCHC: 33.3 g/dL (ref 30.0–36.0)
RBC: 3.82 MIL/uL — ABNORMAL LOW (ref 3.87–5.11)

## 2011-07-15 LAB — MRSA PCR SCREENING: MRSA by PCR: NEGATIVE

## 2011-07-15 MED ORDER — ALBUTEROL SULFATE HFA 108 (90 BASE) MCG/ACT IN AERS: 2.0000 | INHALATION_SPRAY | Freq: Four times a day (QID) | RESPIRATORY_TRACT | Status: DC

## 2011-07-15 MED ORDER — HYDROCODONE-ACETAMINOPHEN 5-325 MG PO TABS: 2.0000 | ORAL_TABLET | ORAL | Status: DC | PRN

## 2011-07-15 NOTE — Discharge Summary (Signed)
Patient ID: MALERIE EAKINS MRN: [id number] DOB/AGE: [birth year] 60 y.o.  Admit date: 07/13/2011 Discharge date: 07/15/2011  Primary Care Physician:  Pearla Dubonnet, MD, MD  Discharge Diagnoses:    Present on Admission:  .Acute Pancreatitis .Hypertension .COPD (chronic obstructive pulmonary disease)    Current Discharge Medication List    START taking these medications   Details  albuterol (PROVENTIL HFA;VENTOLIN HFA) 108 (90 BASE) MCG/ACT inhaler Inhale 2 puffs into the lungs 4 (four) times daily. Qty: 6.7 g, Refills: 2      CONTINUE these medications which have CHANGED   Details  HYDROcodone-acetaminophen (NORCO) 5-325 MG per tablet Take 2 tablets by mouth every 4 (four) hours as needed for pain. For pain Qty: 30 tablet, Refills: 0      CONTINUE these medications which have NOT CHANGED   Details  diclofenac sodium (VOLTAREN) 1 % GEL Apply 1 application topically 3 (three) times daily as needed. For pain     Esomeprazole Magnesium (NEXIUM PO) Take 40 mg by mouth daily.     Levothyroxine Sodium (LEVOXYL PO) Take 75 mcg by mouth daily.     Montelukast Sodium (SINGULAIR PO) Take 10 mg by mouth daily.     promethazine (PHENERGAN) 25 MG tablet Take 25 mg by mouth every 8 (eight) hours as needed. For nausea     spironolactone (ALDACTONE) 25 MG tablet Take 25 mg by mouth 2 (two) times daily.          Disposition and Follow-up: Follow up with PCP in 1 week  follow up with Dr Bosie Clos in GI clinic in 2 weeks  Consults:  none  Significant Diagnostic Studies:  CT abdomen and pelvis with contrast ( 12/07) Findings: Mild dependent atelectasis at the right lung base with  trace bilateral pleural effusions.  Liver, spleen, and adrenal glands within normal limits.  Mild peripancreatic stranding surrounding the pancreatic tail  (series 2/image 24).  Status post cholecystectomy. No intrahepatic or extrahepatic  ductal dilatation.  Kidneys are within normal limits.  No hydronephrosis.  No evidence of bowel obstruction. Normal appendix. No colonic  wall thickening or inflammatory changes.  Atherosclerotic calcifications of the abdominal aorta and branch  vessels.  No abdominopelvic ascites.  No suspicious abdominopelvic lymphadenopathy.  This hysterectomy. No adnexal masses.  Bladder is within normal limits.  Status post PLIF at L4-5.  IMPRESSION:  Mild peripancreatic stranding surrounding the pancreatic tail,  correlate for pancreatitis.  No colonic wall thickening or inflammatory changes.  Brief H and P: For complete details please refer to admission H and P, but in brief 60 yo w back pain x 2 weeks comes back to ED today because of unrelenting pain. She was seen by Dr Bosie Clos (GI) for pain and CT was ordered. He called her and told her to go to the ED when he got the results. The patient sent several hours in the ED and eventually was discharged with pain medication and told to take clear liquid diet, but the pain continued to be severe despite taking her pain meds and following the diet. She went to urgent care 3 days ago thinking it was a UTI, but urine was negative. So this is her fourth visit for the pain. No history of pancreatitis. No alcohol and she has history of chole several years ago. Her HCTZ was discontinued a few weeks ago because of rash.      Physical Exam on Discharge:  Filed Vitals:   07/14/11 2101 07/14/11 2300 07/15/11 0525 07/15/11 4098  BP: 108/72  123/67   Pulse: 87  85   Temp: 98.7 F (37.1 C)  99.5 F (37.5 C)   TempSrc: Oral  Oral   Resp: 20  18   Height:  5' 2.5" (1.588 m)    Weight:  83.915 kg (185 lb)    SpO2: 98%  98% 97%     Intake/Output Summary (Last 24 hours) at 07/15/11 1256 Last data filed at 07/15/11 0900  Gross per 24 hour  Intake   3710 ml  Output   2000 ml  Net   1710 ml    General: middle aged female lying in bed in NAD HEENT: no pallor, no icterus, moist oral mucosa Heart: Regular rate  and rhythm, without murmurs, rubs, gallops. Lungs: Clear to auscultation bilaterally. Abdomen: Soft, Minimal epigastric tenderness on deep palpation, nondistended, positive bowel sounds. Extremities: No clubbing cyanosis or edema with positive pedal pulses. Neuro: Grossly intact, nonfocal.  CBC:    Component Value Date/Time   WBC 4.9 07/15/2011 0558   HGB 10.3* 07/15/2011 0558   HCT 30.9* 07/15/2011 0558   PLT 275 07/15/2011 0558   MCV 80.9 07/15/2011 0558   NEUTROABS 4.3 07/12/2011 2150   LYMPHSABS 2.4 07/12/2011 2150   MONOABS 0.6 07/12/2011 2150   EOSABS 0.3 07/12/2011 2150   BASOSABS 0.1 07/12/2011 2150    Basic Metabolic Panel:    Component Value Date/Time   NA 141 07/15/2011 0558   K 3.8 07/15/2011 0558   CL 111 07/15/2011 0558   CO2 23 07/15/2011 0558   BUN <3* 07/15/2011 0558   CREATININE 0.59 07/15/2011 0558   GLUCOSE 125* 07/15/2011 0558   CALCIUM 7.8* 07/15/2011 0558    Hospital Course:   #1 acute pancreatitis  She presented with persistent epigastric pain with elevated lipase and amylase and CT abdomen showing peripancreatic stranding over the tail of the pancreas.  Patient was seen by her GI  Dr. Bosie Clos in the clinic 2 days ago, and since her pain was persistent he recommended her to return to the ED.  Her cause for her acute pancreatitis is unclear, her LFTs are normal and patient denies use of alcohol. She was on HCTZ in the past for HTN but it seems this was discontinued over 3 months back by her PCP. She is also not on any other medications that could potentially contribute to pancreatitis.  Patient made n.p.o. Overnight, continue with prn Dilaudid for pain and given IV hydration. -Zofran as needed for nausea  -Her pain is much better  And minimal on exam today. Lipase has been trending down ( 237 today, was 508 on admission)  started her on clear liquids which she tolerated well and now tolerating regular diet today.  I spoke with Dr Bosie Clos today and he  agrees with the plan and have her follow up with him in 2 weeks as outpatient.  Rest of her medical issues have been stable. Patient clinically stable to be discharged home with outpt follow up.   Time spent on Discharge: 45 minutes  Signed: Eddie North 07/15/2011, 12:56 PM

## 2011-07-18 NOTE — Progress Notes (Signed)
Retro ur ins reivew 

## 2012-01-28 ENCOUNTER — Ambulatory Visit (HOSPITAL_BASED_OUTPATIENT_CLINIC_OR_DEPARTMENT_OTHER): Payer: Medicare Other | Admitting: Physical Medicine & Rehabilitation

## 2012-01-28 ENCOUNTER — Encounter: Payer: Self-pay | Admitting: Physical Medicine & Rehabilitation

## 2012-01-28 ENCOUNTER — Encounter: Payer: Medicare Other | Attending: Physical Medicine & Rehabilitation

## 2012-01-28 VITALS — BP 160/92 | HR 68 | Resp 16 | Ht 62.0 in | Wt 189.0 lb

## 2012-01-28 DIAGNOSIS — G8928 Other chronic postprocedural pain: Secondary | ICD-10-CM

## 2012-01-28 DIAGNOSIS — M533 Sacrococcygeal disorders, not elsewhere classified: Secondary | ICD-10-CM

## 2012-01-28 DIAGNOSIS — Z981 Arthrodesis status: Secondary | ICD-10-CM | POA: Insufficient documentation

## 2012-01-28 DIAGNOSIS — M961 Postlaminectomy syndrome, not elsewhere classified: Secondary | ICD-10-CM

## 2012-01-28 DIAGNOSIS — M549 Dorsalgia, unspecified: Secondary | ICD-10-CM | POA: Insufficient documentation

## 2012-01-28 MED ORDER — TRAMADOL-ACETAMINOPHEN 37.5-325 MG PO TABS: 1.0000 | ORAL_TABLET | Freq: Three times a day (TID) | ORAL | Status: AC | PRN

## 2012-01-28 NOTE — Progress Notes (Signed)
  Subjective:    Patient ID: Tina Neal, female    DOB: 1950/10/14, 61 y.o.   MRN: [id number]  HPI H. And has been seen by me on 03/02/2009. At that time she was ready to undergo surgery for lumbar stenosis. She underwent a L4-L5 PLIF by Dr. Shon Baton on 04/13/2009 L TKR 04/16/10  Pain Inventory Average Pain 5 Pain Right Now 5 My pain is aching  In the last 24 hours, has pain interfered with the following? General activity 0 Relation with others 0 Enjoyment of life 0 What TIME of day is your pain at its worst? morning Sleep (in general) Fair  Pain is worse with: sitting and standing Pain improves with: medication Relief from Meds: 8  Mobility walk without assistance ability to climb steps?  yes do you drive?  yes  Function Do you have any goals in this area?  yes  Neuro/Psych No problems in this area  Prior Studies Any changes since last visit?  no  Physicians involved in your care Primary care Dr. Kevan Ny   History reviewed. No pertinent family history. History   Social History  . Marital Status: Married    Spouse Name: N/A    Number of Children: N/A  . Years of Education: N/A   Social History Main Topics  . Smoking status: Current Everyday Smoker -- 1.0 packs/day    Types: Cigarettes  . Smokeless tobacco: None  . Alcohol Use: No     occasional  . Drug Use: No  . Sexually Active: Yes    Birth Control/ Protection: None   Other Topics Concern  . None   Social History Narrative  . None   Past Surgical History  Procedure Date  . Cholecystectomy   . Back surgery   . Joint replacement   . Abdominal hysterectomy   . Thyroidectomy   . Rotator cuff    Past Medical History  Diagnosis Date  . Asthma   . Hypertension   . GERD (gastroesophageal reflux disease)   . Pancreatitis    BP 160/92  Pulse 68  Resp 16  Ht 5\' 2"  (1.575 m)  Wt 189 lb (85.73 kg)  BMI 34.57 kg/m2  SpO2 98%      Review of Systems  Constitutional: Negative.   HENT:  Negative.   Eyes: Negative.   Respiratory: Negative.   Cardiovascular: Negative.   Gastrointestinal: Negative.   Genitourinary: Negative.   Musculoskeletal: Positive for back pain.  Skin: Negative.   Neurological: Negative.   Hematological: Negative.   Psychiatric/Behavioral: Negative.        Objective:   Physical Exam        Assessment & Plan:

## 2012-01-28 NOTE — Patient Instructions (Signed)
You will take your new pain medicine 3 times per day I will see you back for a sacroiliac injection to help with your low back pain You can try your Voltaren gel on your left ankle as well as on your knee  Plantar Fasciitis (Heel Spur Syndrome) with Rehab The plantar fascia is a fibrous, ligament-like, soft-tissue structure that spans the bottom of the foot. Plantar fasciitis is a condition that causes pain in the foot due to inflammation of the tissue. SYMPTOMS   Pain and tenderness on the underneath side of the foot.   Pain that worsens with standing or walking.  CAUSES  Plantar fasciitis is caused by irritation and injury to the plantar fascia on the underneath side of the foot. Common mechanisms of injury include:  Direct trauma to bottom of the foot.   Damage to a small nerve that runs under the foot where the main fascia attaches to the heel bone.   Stress placed on the plantar fascia due to bone spurs.  RISK INCREASES WITH:   Activities that place stress on the plantar fascia (running, jumping, pivoting, or cutting).   Poor strength and flexibility.   Improperly fitted shoes.   Tight calf muscles.   Flat feet.   Failure to warm-up properly before activity.   Obesity.  PREVENTION  Warm up and stretch properly before activity.   Allow for adequate recovery between workouts.   Maintain physical fitness:   Strength, flexibility, and endurance.   Cardiovascular fitness.   Maintain a health body weight.   Avoid stress on the plantar fascia.   Wear properly fitted shoes, including arch supports for individuals who have flat feet.  PROGNOSIS  If treated properly, then the symptoms of plantar fasciitis usually resolve without surgery. However, occasionally surgery is necessary. RELATED COMPLICATIONS   Recurrent symptoms that may result in a chronic condition.   Problems of the lower back that are caused by compensating for the injury, such as limping.   Pain  or weakness of the foot during push-off following surgery.   Chronic inflammation, scarring, and partial or complete fascia tear, occurring more often from repeated injections.  TREATMENT  Treatment initially involves the use of ice and medication to help reduce pain and inflammation. The use of strengthening and stretching exercises may help reduce pain with activity, especially stretches of the Achilles tendon. These exercises may be performed at home or with a therapist. Your caregiver may recommend that you use heel cups of arch supports to help reduce stress on the plantar fascia. Occasionally, corticosteroid injections are given to reduce inflammation. If symptoms persist for greater than 6 months despite non-surgical (conservative), then surgery may be recommended.  MEDICATION   If pain medication is necessary, then nonsteroidal anti-inflammatory medications, such as aspirin and ibuprofen, or other minor pain relievers, such as acetaminophen, are often recommended.   Do not take pain medication within 7 days before surgery.   Prescription pain relievers may be given if deemed necessary by your caregiver. Use only as directed and only as much as you need.   Corticosteroid injections may be given by your caregiver. These injections should be reserved for the most serious cases, because they may only be given a certain number of times.  HEAT AND COLD  Cold treatment (icing) relieves pain and reduces inflammation. Cold treatment should be applied for 10 to 15 minutes every 2 to 3 hours for inflammation and pain and immediately after any activity that aggravates your symptoms. Use  ice packs or massage the area with a piece of ice (ice massage).   Heat treatment may be used prior to performing the stretching and strengthening activities prescribed by your caregiver, physical therapist, or athletic trainer. Use a heat pack or soak the injury in warm water.  SEEK IMMEDIATE MEDICAL CARE  IF:  Treatment seems to offer no benefit, or the condition worsens.   Any medications produce adverse side effects.  EXERCISES RANGE OF MOTION (ROM) AND STRETCHING EXERCISES - Plantar Fasciitis (Heel Spur Syndrome) These exercises may help you when beginning to rehabilitate your injury. Your symptoms may resolve with or without further involvement from your physician, physical therapist or athletic trainer. While completing these exercises, remember:   Restoring tissue flexibility helps normal motion to return to the joints. This allows healthier, less painful movement and activity.   An effective stretch should be held for at least 30 seconds.   A stretch should never be painful. You should only feel a gentle lengthening or release in the stretched tissue.  RANGE OF MOTION - Toe Extension, Flexion  Sit with your right / left leg crossed over your opposite knee.   Grasp your toes and gently pull them back toward the top of your foot. You should feel a stretch on the bottom of your toes and/or foot.   Hold this stretch for __________ seconds.   Now, gently pull your toes toward the bottom of your foot. You should feel a stretch on the top of your toes and or foot.   Hold this stretch for __________ seconds.  Repeat __________ times. Complete this stretch __________ times per day.  RANGE OF MOTION - Ankle Dorsiflexion, Active Assisted  Remove shoes and sit on a chair that is preferably not on a carpeted surface.   Place right / left foot under knee. Extend your opposite leg for support.   Keeping your heel down, slide your right / left foot back toward the chair until you feel a stretch at your ankle or calf. If you do not feel a stretch, slide your bottom forward to the edge of the chair, while still keeping your heel down.   Hold this stretch for __________ seconds.  Repeat __________ times. Complete this stretch __________ times per day.  STRETCH - Gastroc, Standing  Place  hands on wall.   Extend right / left leg, keeping the front knee somewhat bent.   Slightly point your toes inward on your back foot.   Keeping your right / left heel on the floor and your knee straight, shift your weight toward the wall, not allowing your back to arch.   You should feel a gentle stretch in the right / left calf. Hold this position for __________ seconds.  Repeat __________ times. Complete this stretch __________ times per day. STRETCH - Soleus, Standing  Place hands on wall.   Extend right / left leg, keeping the other knee somewhat bent.   Slightly point your toes inward on your back foot.   Keep your right / left heel on the floor, bend your back knee, and slightly shift your weight over the back leg so that you feel a gentle stretch deep in your back calf.   Hold this position for __________ seconds.  Repeat __________ times. Complete this stretch __________ times per day. STRETCH - Gastrocsoleus, Standing  Note: This exercise can place a lot of stress on your foot and ankle. Please complete this exercise only if specifically instructed by your caregiver.  Place the ball of your right / left foot on a step, keeping your other foot firmly on the same step.   Hold on to the wall or a rail for balance.   Slowly lift your other foot, allowing your body weight to press your heel down over the edge of the step.   You should feel a stretch in your right / left calf.   Hold this position for __________ seconds.   Repeat this exercise with a slight bend in your right / left knee.  Repeat __________ times. Complete this stretch __________ times per day.  STRENGTHENING EXERCISES - Plantar Fasciitis (Heel Spur Syndrome)  These exercises may help you when beginning to rehabilitate your injury. They may resolve your symptoms with or without further involvement from your physician, physical therapist or athletic trainer. While completing these exercises, remember:    Muscles can gain both the endurance and the strength needed for everyday activities through controlled exercises.   Complete these exercises as instructed by your physician, physical therapist or athletic trainer. Progress the resistance and repetitions only as guided.  STRENGTH - Towel Curls  Sit in a chair positioned on a non-carpeted surface.   Place your foot on a towel, keeping your heel on the floor.   Pull the towel toward your heel by only curling your toes. Keep your heel on the floor.   If instructed by your physician, physical therapist or athletic trainer, add ____________________ at the end of the towel.  Repeat __________ times. Complete this exercise __________ times per day. STRENGTH - Ankle Inversion  Secure one end of a rubber exercise band/tubing to a fixed object (table, pole). Loop the other end around your foot just before your toes.   Place your fists between your knees. This will focus your strengthening at your ankle.   Slowly, pull your big toe up and in, making sure the band/tubing is positioned to resist the entire motion.   Hold this position for __________ seconds.   Have your muscles resist the band/tubing as it slowly pulls your foot back to the starting position.  Repeat __________ times. Complete this exercises __________ times per day.  Document Released: 07/22/2005 Document Revised: 07/11/2011 Document Reviewed: 11/03/2008 [facility] Patient Information 2012 [place], [place].

## 2012-03-05 ENCOUNTER — Ambulatory Visit: Payer: Medicare Other | Admitting: Physical Medicine & Rehabilitation

## 2012-03-19 ENCOUNTER — Encounter (HOSPITAL_COMMUNITY): Payer: Self-pay | Admitting: *Deleted

## 2012-03-19 ENCOUNTER — Emergency Department (INDEPENDENT_AMBULATORY_CARE_PROVIDER_SITE_OTHER)
Admission: EM | Admit: 2012-03-19 | Discharge: 2012-03-19 | Disposition: A | Discharge status: Home / Self Care | Payer: Medicare Other | Source: Home / Self Care | Attending: Family Medicine | Admitting: Family Medicine

## 2012-03-19 DIAGNOSIS — Z79899 Other long term (current) drug therapy: Secondary | ICD-10-CM

## 2012-03-19 DIAGNOSIS — M79609 Pain in unspecified limb: Secondary | ICD-10-CM

## 2012-03-19 DIAGNOSIS — I10 Essential (primary) hypertension: Secondary | ICD-10-CM

## 2012-03-19 DIAGNOSIS — M1310 Monoarthritis, not elsewhere classified, unspecified site: Secondary | ICD-10-CM

## 2012-03-19 LAB — CBC
HCT: 34.8 % — ABNORMAL LOW (ref 36.0–46.0)
Hemoglobin: 11.6 g/dL — ABNORMAL LOW (ref 12.0–15.0)
MCHC: 33.3 g/dL (ref 30.0–36.0)
RBC: 4.31 MIL/uL (ref 3.87–5.11)
WBC: 6.6 10*3/uL (ref 4.0–10.5)

## 2012-03-19 MED ORDER — COLCHICINE 0.6 MG PO TABS: ORAL_TABLET | ORAL | Status: DC

## 2012-03-19 MED ORDER — NAPROXEN 500 MG PO TABS: 500.0000 mg | ORAL_TABLET | Freq: Two times a day (BID) | ORAL | Status: DC

## 2012-03-19 NOTE — ED Provider Notes (Signed)
History     CSN: [id number]  Arrival date & time 03/19/12  1215   First MD Initiated Contact with Patient 03/19/12 1250      Chief Complaint  Patient presents with  . Toe Pain    (Consider location/radiation/quality/duration/timing/severity/associated sxs/prior treatment) HPI Comments: 61 year old female with a history of hypertension and pancreatitis. Here complaining of pain in the right big toe since last night. States that pain is throbbing and shooting and woke her up from her sleep.  Associated with swelling this morning. Pain worse with flexion and extension of the big toe. Denies known injury or recent falls. No past history of gout. Patient takes spironolactone for her  hypertension. Denies generalized malaise, fever or chills. Has experienced some intermittent discomfort in her ankles bilaterally in last few days with no swelling or redness.    Past Medical History  Diagnosis Date  . Asthma   . Hypertension   . GERD (gastroesophageal reflux disease)   . Pancreatitis     Past Surgical History  Procedure Date  . Cholecystectomy   . Back surgery   . Joint replacement   . Abdominal hysterectomy   . Thyroidectomy   . Rotator cuff   . Knee surgery     Family History  Problem Relation Age of Onset  . Asthma Mother   . Cancer Father     History  Substance Use Topics  . Smoking status: Current Everyday Smoker -- 1.0 packs/day    Types: Cigarettes  . Smokeless tobacco: Not on file  . Alcohol Use: No     occasional    OB History    Grav Para Term Preterm Abortions TAB SAB Ect Mult Living                  Review of Systems  Constitutional: Negative for fever, chills and fatigue.       10 systems reviewed and  pertinent negative and positive symptoms are as per HPI.     Cardiovascular: Negative for leg swelling.  Musculoskeletal: Positive for arthralgias.       As per HPI  Skin: Negative for rash.  Neurological: Negative for headaches.  All other  systems reviewed and are negative.    Allergies  Hctz  Home Medications   Current Outpatient Rx  Name Route Sig Dispense Refill  . SINGULAIR PO Oral Take 10 mg by mouth daily.     Marland Kitchen SPIRONOLACTONE 25 MG PO TABS Oral Take 25 mg by mouth 2 (two) times daily.      . ALBUTEROL SULFATE HFA 108 (90 BASE) MCG/ACT IN AERS Inhalation Inhale 2 puffs into the lungs 4 (four) times daily. 6.7 g 2  . COLCHICINE 0.6 MG PO TABS  2 tabs po x1 can repeat taking 1 tab in 1 hour 3 tablet 0  . DICLOFENAC SODIUM 1 % TD GEL Topical Apply 1 application topically 3 (three) times daily as needed. For pain     . NEXIUM PO Oral Take 40 mg by mouth daily.     Marland Kitchen HYDROCODONE-ACETAMINOPHEN 5-325 MG PO TABS Oral Take 2 tablets by mouth every 4 (four) hours as needed for pain. For pain 30 tablet 0  . LEVOXYL PO Oral Take 75 mcg by mouth daily.     Marland Kitchen NAPROXEN 500 MG PO TABS Oral Take 1 tablet (500 mg total) by mouth 2 (two) times daily with a meal. 14 tablet 0    BP 126/82  Pulse 84  Temp 98 F (  36.7 C) (Oral)  Resp 18  SpO2 100%  Physical Exam  Nursing note and vitals reviewed. Constitutional: She is oriented to person, place, and time. She appears well-developed and well-nourished. No distress.  HENT:  Head: Normocephalic and atraumatic.  Mouth/Throat: No oropharyngeal exudate.  Eyes: No scleral icterus.  Cardiovascular: Normal heart sounds.   Pulmonary/Chest: Breath sounds normal.  Musculoskeletal:       Right 1st toe. Tender to palpation over MPJ and PIPJ. No obvious erythema or swelling. No distal cyanosis. Patient able to flex and extend toe with reported discomfort. No tenderness over toe pad. Toenail with red nail polish appears intact. Intact dorsal pedal and tibial posterior pulses. Both feet equally warm.  Intact superficial sensation also appears intact in entire right 1st toe. Ankles with no deformity, erythema, swelling or tenderness bilaterally.    Lymphadenopathy:    She has no cervical  adenopathy.  Neurological: She is alert and oriented to person, place, and time.  Skin:       No skin brakes or erythema.    ED Course  Procedures (including critical care time)   Labs Reviewed  CBC  URIC ACID   No results found.   1. Acute monoarthritis       MDM  Impress gout related monoarthritis. Otherwise clinically well. Decided to treat with naproxen and colchicine. Blood was drawn for CBC and uric acid today. Test results pending at the time of discharge. Patient asked to followup during next week with her primary care provider. Explained that we will be able to fax lab work results upon request.        Sharin Grave, MD 03/21/12 1435

## 2012-03-19 NOTE — ED Notes (Signed)
Pt reports being awakened from sleep with pain in right great toe. Also pain in bilateral ankles. Toe is swollen,red and hot to touch. Pt denies injury.

## 2012-04-09 ENCOUNTER — Ambulatory Visit: Payer: Medicare Other | Admitting: Physical Medicine & Rehabilitation

## 2012-04-30 ENCOUNTER — Encounter: Payer: Medicare Other | Attending: Physical Medicine & Rehabilitation

## 2012-04-30 ENCOUNTER — Encounter: Payer: Self-pay | Admitting: Physical Medicine & Rehabilitation

## 2012-04-30 ENCOUNTER — Ambulatory Visit (HOSPITAL_BASED_OUTPATIENT_CLINIC_OR_DEPARTMENT_OTHER): Payer: Medicare Other | Admitting: Physical Medicine & Rehabilitation

## 2012-04-30 VITALS — BP 126/82 | HR 88 | Resp 14 | Ht 63.0 in | Wt 187.0 lb

## 2012-04-30 DIAGNOSIS — M25519 Pain in unspecified shoulder: Secondary | ICD-10-CM | POA: Insufficient documentation

## 2012-04-30 DIAGNOSIS — S46819A Strain of other muscles, fascia and tendons at shoulder and upper arm level, unspecified arm, initial encounter: Secondary | ICD-10-CM

## 2012-04-30 DIAGNOSIS — M79609 Pain in unspecified limb: Secondary | ICD-10-CM | POA: Insufficient documentation

## 2012-04-30 DIAGNOSIS — M25819 Other specified joint disorders, unspecified shoulder: Secondary | ICD-10-CM | POA: Insufficient documentation

## 2012-04-30 DIAGNOSIS — G8929 Other chronic pain: Secondary | ICD-10-CM

## 2012-04-30 DIAGNOSIS — S43499A Other sprain of unspecified shoulder joint, initial encounter: Secondary | ICD-10-CM

## 2012-04-30 MED ORDER — TRAMADOL HCL 50 MG PO TABS: 50.0000 mg | ORAL_TABLET | Freq: Four times a day (QID) | ORAL | Status: DC | PRN

## 2012-04-30 NOTE — Patient Instructions (Addendum)
Please call us with the name of the medication Dr. Kevan Ny has prescribed for you If your foot pain continues to bother you please call [facility] I'll see you in one month for a shoulder ultrasound.This is to find out what is causing your left shoulder pain If your shoulder is doing better by that time we may not need to do the ultrasound test. I have increased the strength of your tramadol may take it every 6 hours as needed

## 2012-04-30 NOTE — Progress Notes (Signed)
Subjective:    Patient ID: Tina Neal, female    DOB: Aug 14, 1950, 61 y.o.   MRN: [id number]  HPI Patient complaining of bilateral shoulder pain today. She has a history of acromioplasty on the right side by Dr. Rennis Chris more than 5 years ago. She has had shoulder injection in the past which was beneficial at least for short per time. She's not doing any type of exercise for her shoulder. She continues to have foot pain. Pain Inventory Average Pain 6 Pain Right Now 6 My pain is sharp and burning  In the last 24 hours, has pain interfered with the following? General activity 5 Relation with others 5 Enjoyment of life 5 What TIME of day is your pain at its worst? night Sleep (in general) Fair  Pain is worse with: walking, standing and some activites Pain improves with: medication Relief from Meds: 4  Mobility walk without assistance how many minutes can you walk? 5-10 ability to climb steps?  yes do you drive?  yes  Function not employed: date last employed   Neuro/Psych weakness numbness tingling  Prior Studies Any changes since last visit?  no  Physicians involved in your care Any changes since last visit?  no   Family History  Problem Relation Age of Onset  . Asthma Mother   . Cancer Father    History   Social History  . Marital Status: Married    Spouse Name: N/A    Number of Children: N/A  . Years of Education: N/A   Social History Main Topics  . Smoking status: Current Every Day Smoker -- 1.0 packs/day    Types: Cigarettes  . Smokeless tobacco: None  . Alcohol Use: No     occasional  . Drug Use: No  . Sexually Active: Yes    Birth Control/ Protection: None   Other Topics Concern  . None   Social History Narrative  . None   Past Surgical History  Procedure Date  . Cholecystectomy   . Back surgery   . Joint replacement   . Abdominal hysterectomy   . Thyroidectomy   . Rotator cuff   . Knee surgery    Past Medical History    Diagnosis Date  . Asthma   . Hypertension   . GERD (gastroesophageal reflux disease)   . Pancreatitis    BP 126/82  Pulse 88  Resp 14  Ht 5\' 3"  (1.6 m)  Wt 187 lb (84.823 kg)  BMI 33.13 kg/m2  SpO2 100%     Review of Systems  Musculoskeletal: Positive for myalgias, back pain and arthralgias.  Neurological: Positive for weakness and numbness.  All other systems reviewed and are negative.       Objective:   Physical Exam  Constitutional: She is oriented to person, place, and time. She appears well-developed and well-nourished.  Musculoskeletal:       Left shoulder: She exhibits decreased range of motion, tenderness and pain. She exhibits no spasm.       Right foot: She exhibits deformity.       Left foot: She exhibits deformity.       Pes planus bilaterally.  Positive nears test left shoulder Positive impingement sign left shoulder Tenderness in the posterior lateral area as well as the anterior area as well as the bicipital groove area. Shoulder weakness mainly due to pain   Neurological: She is alert and oriented to person, place, and time. She has normal strength. No sensory deficit.  Psychiatric:  She has a normal mood and affect.          Assessment & Plan:  1. Left shoulder pain likely impingement syndrome. We'll try injecting today. If this is not helpful we'll do ultrasound next visit  2. Foot pain this may have 2 factors one is a plantar fasciitis and the other it may be some neurogenic pain.She states she has been seen by [facility] in the past. In addition she states she is on some type of medicine prescribed by her primary care physician. She will call to let us know about the medication. She can also call [facility] should she wish to schedule an appointment there.   Left shoulder injection Indication left subacromial impingement syndrome Pain is only partially response to medication management and other conservative care Informed  consent was obtained after describing risks and benefits of the procedure with the patient these include bleeding bruising and infection she elects to proceed and has given written consent Patient placed in a seated position area marked and prepped with Betadine alcohol and the posterior lateral area. A 25-gauge inch Needle was used to perform the seizure. After negative drop back for blood 1 cc of 40 mg per cc Depo-Medrol and 4 cc 1% lidocaine were injected. Patient tolerated procedure well

## 2012-05-04 ENCOUNTER — Telehealth: Payer: Self-pay | Admitting: Physical Medicine & Rehabilitation

## 2012-05-04 NOTE — Telephone Encounter (Signed)
Pt doesn't need a refill. She was just calling to let us know what the name of the medication that Dr. Kevan Ny was giving her for her foot pain.

## 2012-05-04 NOTE — Telephone Encounter (Signed)
Refill on Naproxen 500 mg

## 2012-05-28 ENCOUNTER — Ambulatory Visit: Payer: Medicare Other | Admitting: Physical Medicine & Rehabilitation

## 2012-06-22 ENCOUNTER — Ambulatory Visit (HOSPITAL_BASED_OUTPATIENT_CLINIC_OR_DEPARTMENT_OTHER): Payer: Medicare Other | Admitting: Physical Medicine & Rehabilitation

## 2012-06-22 ENCOUNTER — Encounter: Payer: Medicare Other | Attending: Physical Medicine & Rehabilitation

## 2012-06-22 ENCOUNTER — Encounter: Payer: Self-pay | Admitting: Physical Medicine & Rehabilitation

## 2012-06-22 VITALS — BP 125/79 | HR 81 | Resp 14 | Ht 62.0 in | Wt 187.0 lb

## 2012-06-22 DIAGNOSIS — M75100 Unspecified rotator cuff tear or rupture of unspecified shoulder, not specified as traumatic: Secondary | ICD-10-CM

## 2012-06-22 DIAGNOSIS — M25819 Other specified joint disorders, unspecified shoulder: Secondary | ICD-10-CM | POA: Insufficient documentation

## 2012-06-22 DIAGNOSIS — M25519 Pain in unspecified shoulder: Secondary | ICD-10-CM | POA: Insufficient documentation

## 2012-06-22 DIAGNOSIS — M7532 Calcific tendinitis of left shoulder: Secondary | ICD-10-CM

## 2012-06-22 DIAGNOSIS — M79609 Pain in unspecified limb: Secondary | ICD-10-CM | POA: Insufficient documentation

## 2012-06-22 DIAGNOSIS — M19019 Primary osteoarthritis, unspecified shoulder: Secondary | ICD-10-CM | POA: Insufficient documentation

## 2012-06-22 MED ORDER — NAPROXEN 500 MG PO TABS: 500.0000 mg | ORAL_TABLET | Freq: Two times a day (BID) | ORAL | Status: DC

## 2012-06-22 NOTE — Progress Notes (Signed)
  Subjective:    Patient ID: Tina Neal, female    DOB: [birth year], 61 y.o.   MRN: [id number]  HPI Patient complaining of bilateral shoulder pain today. She has a history of acromioplasty on the right side by Dr. Rennis Chris more than 5 years ago. L shoulder injection lasted one week.     Pain Inventory Average Pain 8 Pain Right Now 7 My pain is sharp, burning and aching  In the last 24 hours, has pain interfered with the following? General activity 8 Relation with others 4 Enjoyment of life 5 What TIME of day is your pain at its worst? evening and night Sleep (in general) Poor  Pain is worse with: some activites Pain improves with: medication Relief from Meds: 3  Mobility walk without assistance ability to climb steps?  yes do you drive?  yes  Function disabled: date disabled 2004  Neuro/Psych No problems in this area  Prior Studies Any changes since last visit?  no  Physicians involved in your care Any changes since last visit?  no   Family History  Problem Relation Age of Onset  . Asthma Mother   . Cancer Father    History   Social History  . Marital Status: Married    Spouse Name: N/A    Number of Children: N/A  . Years of Education: N/A   Social History Main Topics  . Smoking status: Current Every Day Smoker -- 1.0 packs/day    Types: Cigarettes  . Smokeless tobacco: None  . Alcohol Use: No     Comment: occasional  . Drug Use: No  . Sexually Active: Yes    Birth Control/ Protection: None   Other Topics Concern  . None   Social History Narrative  . None   Past Surgical History  Procedure Date  . Cholecystectomy   . Back surgery   . Joint replacement   . Abdominal hysterectomy   . Thyroidectomy   . Rotator cuff   . Knee surgery    Past Medical History  Diagnosis Date  . Asthma   . Hypertension   . GERD (gastroesophageal reflux disease)   . Pancreatitis    BP 125/79  Pulse 81  Resp 14  Ht 5\' 2"  (1.575 m)  Wt 187 lb (84.823 kg)   BMI 34.20 kg/m2  SpO2 100%     Review of Systems  Musculoskeletal: Positive for myalgias and arthralgias.  All other systems reviewed and are negative.       Objective:   Physical Exam        Assessment & Plan:  Left shoulder ultrasound examination complete 12 Hz linear transducer Patient in seated position Biceps tendon no evidence of subluxation within the groove No evidence of tear Cross-section 2 mm Supraspinatus tendon short axis and long axis views Cortical irregularity at supraspinatus insertion site long axis view no evidence of tear tendon thickness 4.4 mm normal no evidence of subacromial fluid Left subscapularis evidence of calcific tendinitis at insertion upon the lesser tuberosity no evidence of tear Left infraspinatus long axis and short axis views no evidence of tear Left a.c. Joint the long axis views shows decreased joint space with osteophyte formation both at acromial and clavicular aspect  1. Abnormal study  2. Calcific tendinitis left subscapularis tendon 3. Left a.c. Joint arthropathy 4. No evidence of supraspinatus or infraspinatus tear

## 2012-06-22 NOTE — Patient Instructions (Signed)
Your ultrasound today showed left a.c. Joint arthritis Calcific tendinopathy of the left subscapularis tendon No rotator cuff tear is seen in the supraspinatus or infraspinatus. There was cortical irregularity of the supraspinatus insertion site on the left side.  No evidence of bursitis I recommend physical therapy and a trial of Naprosyn You will call Dr. Rennis Chris to make an appointment with him

## 2012-07-07 ENCOUNTER — Other Ambulatory Visit: Payer: Self-pay | Admitting: Orthopedic Surgery

## 2012-07-07 DIAGNOSIS — M25512 Pain in left shoulder: Secondary | ICD-10-CM

## 2012-07-11 ENCOUNTER — Other Ambulatory Visit: Payer: Medicare Other

## 2012-07-18 ENCOUNTER — Ambulatory Visit
Admission: RE | Admit: 2012-07-18 | Discharge: 2012-07-18 | Disposition: A | Discharge status: Home / Self Care | Payer: Medicare Other | Source: Ambulatory Visit | Attending: Orthopedic Surgery | Admitting: Orthopedic Surgery

## 2012-07-18 DIAGNOSIS — M25512 Pain in left shoulder: Secondary | ICD-10-CM

## 2012-11-02 ENCOUNTER — Other Ambulatory Visit: Payer: Self-pay | Admitting: Gastroenterology

## 2012-11-02 DIAGNOSIS — R109 Unspecified abdominal pain: Secondary | ICD-10-CM

## 2012-11-03 ENCOUNTER — Ambulatory Visit
Admission: RE | Admit: 2012-11-03 | Discharge: 2012-11-03 | Disposition: A | Discharge status: Home / Self Care | Payer: PRIVATE HEALTH INSURANCE | Source: Ambulatory Visit | Attending: Gastroenterology | Admitting: Gastroenterology

## 2012-11-03 DIAGNOSIS — R109 Unspecified abdominal pain: Secondary | ICD-10-CM

## 2012-11-03 MED ORDER — IOHEXOL 300 MG/ML  SOLN
100.0000 mL | Freq: Once | INTRAMUSCULAR | Status: AC | PRN
  Administered 2012-11-03: 100 mL via INTRAVENOUS

## 2012-11-12 ENCOUNTER — Other Ambulatory Visit: Payer: Self-pay | Admitting: Podiatry

## 2012-11-12 DIAGNOSIS — M7989 Other specified soft tissue disorders: Secondary | ICD-10-CM

## 2012-11-12 DIAGNOSIS — M79674 Pain in right toe(s): Secondary | ICD-10-CM

## 2012-11-12 DIAGNOSIS — R2241 Localized swelling, mass and lump, right lower limb: Secondary | ICD-10-CM

## 2012-11-16 ENCOUNTER — Other Ambulatory Visit: Payer: Self-pay | Admitting: Podiatry

## 2012-11-16 DIAGNOSIS — D492 Neoplasm of unspecified behavior of bone, soft tissue, and skin: Secondary | ICD-10-CM

## 2012-11-20 ENCOUNTER — Other Ambulatory Visit: Payer: PRIVATE HEALTH INSURANCE

## 2012-11-23 ENCOUNTER — Ambulatory Visit
Admission: RE | Admit: 2012-11-23 | Discharge: 2012-11-23 | Disposition: A | Discharge status: Home / Self Care | Payer: PRIVATE HEALTH INSURANCE | Source: Ambulatory Visit | Attending: Podiatry | Admitting: Podiatry

## 2012-11-23 DIAGNOSIS — D492 Neoplasm of unspecified behavior of bone, soft tissue, and skin: Secondary | ICD-10-CM

## 2013-02-23 ENCOUNTER — Emergency Department (INDEPENDENT_AMBULATORY_CARE_PROVIDER_SITE_OTHER): Payer: PRIVATE HEALTH INSURANCE

## 2013-02-23 ENCOUNTER — Encounter (HOSPITAL_COMMUNITY): Payer: Self-pay | Admitting: Emergency Medicine

## 2013-02-23 ENCOUNTER — Emergency Department (INDEPENDENT_AMBULATORY_CARE_PROVIDER_SITE_OTHER)
Admission: EM | Admit: 2013-02-23 | Discharge: 2013-02-23 | Disposition: A | Discharge status: Home / Self Care | Payer: PRIVATE HEALTH INSURANCE | Source: Home / Self Care | Attending: Emergency Medicine | Admitting: Emergency Medicine

## 2013-02-23 DIAGNOSIS — M1990 Unspecified osteoarthritis, unspecified site: Secondary | ICD-10-CM

## 2013-02-23 DIAGNOSIS — M79609 Pain in unspecified limb: Secondary | ICD-10-CM

## 2013-02-23 DIAGNOSIS — M79671 Pain in right foot: Secondary | ICD-10-CM

## 2013-02-23 MED ORDER — TRAMADOL HCL 50 MG PO TABS: 50.0000 mg | ORAL_TABLET | Freq: Four times a day (QID) | ORAL | Status: DC | PRN

## 2013-02-23 MED ORDER — MELOXICAM 7.5 MG PO TABS: 7.5000 mg | ORAL_TABLET | Freq: Every day | ORAL | Status: DC

## 2013-02-23 NOTE — ED Notes (Signed)
Left foot pain for perhaps 2 weeks.  Pain is gradually worsening.  Pain is sharp, stabbing and wakes her at night.  Patient touches the inner aspect of left foot as location of pain.  Patient denies injury.  Visible swelling to ankle, but no pain in ankle.  Pedal pulses 2 plus, wiggles toes.  Patient reports pain in nondependant position as well as in weight bearing activity, pain is significantly worse in weight bearing.

## 2013-02-23 NOTE — ED Provider Notes (Signed)
History    CSN: [id number] Arrival date & time 02/23/13  1228  First MD Initiated Contact with Patient 02/23/13 1245     Chief Complaint  Patient presents with  . Foot Pain   (Consider location/radiation/quality/duration/timing/severity/associated sxs/prior Treatment) HPI Comments: Patient describes a for many months she has had left foot pain. Last 2 weeks pain has exacerbated she denies any recent injuries or falls. Have been seen by her Dr. for her foot pain before. She has been taken over-the-counter Aleve for pain with some partial relief. She describes it pain is usually a stabbing sharp in character and wakes her up at night. It hurts mainly when I walk on my left foot.  Patient denies any weakness or tingling or numbness sensation distally or proximally to her left foot. Patient points to the inner aspect of left foot as well as to lower region of medial malleolus"..  Patient is a 62 y.o. female presenting with lower extremity pain. The history is provided by the patient.  Foot Pain This is a recurrent problem. The current episode started more than 2 days ago. The problem occurs constantly. The problem has not changed since onset.Pertinent negatives include no abdominal pain. The symptoms are aggravated by walking. Nothing relieves the symptoms. The treatment provided no relief.   Past Medical History  Diagnosis Date  . Asthma   . Hypertension   . GERD (gastroesophageal reflux disease)   . Pancreatitis    Past Surgical History  Procedure Laterality Date  . Cholecystectomy    . Back surgery    . Joint replacement    . Abdominal hysterectomy    . Thyroidectomy    . Rotator cuff    . Knee surgery     Family History  Problem Relation Age of Onset  . Asthma Mother   . Cancer Father    History  Substance Use Topics  . Smoking status: Current Every Day Smoker -- 1.00 packs/day    Types: Cigarettes  . Smokeless tobacco: Not on file  . Alcohol Use: No     Comment:  occasional   OB History   Grav Para Term Preterm Abortions TAB SAB Ect Mult Living                 Review of Systems  Constitutional: Positive for activity change. Negative for fever.  Gastrointestinal: Negative for abdominal pain.  Musculoskeletal: Positive for joint swelling. Negative for back pain, arthralgias and gait problem.  Skin: Negative for color change, pallor, rash and wound.  Neurological: Negative for weakness and numbness.    Allergies  Hctz  Home Medications   Current Outpatient Rx  Name  Route  Sig  Dispense  Refill  . EXPIRED: albuterol (PROVENTIL HFA;VENTOLIN HFA) 108 (90 BASE) MCG/ACT inhaler   Inhalation   Inhale 2 puffs into the lungs 4 (four) times daily.   6.7 g   2   . diclofenac sodium (VOLTAREN) 1 % GEL   Topical   Apply 1 application topically 3 (three) times daily as needed. For pain          . Esomeprazole Magnesium (NEXIUM PO)   Oral   Take 40 mg by mouth daily.          . Levothyroxine Sodium (LEVOXYL PO)   Oral   Take 75 mcg by mouth daily.          . meloxicam (MOBIC) 7.5 MG tablet   Oral   Take 1 tablet (7.5 mg  total) by mouth daily.   14 tablet   0   . Montelukast Sodium (SINGULAIR PO)   Oral   Take 10 mg by mouth daily.          . naproxen (NAPROSYN) 500 MG tablet   Oral   Take 1 tablet (500 mg total) by mouth 2 (two) times daily with a meal.   60 tablet   1   . spironolactone (ALDACTONE) 25 MG tablet   Oral   Take 25 mg by mouth 2 (two) times daily.           . traMADol (ULTRAM) 50 MG tablet   Oral   Take 1 tablet (50 mg total) by mouth every 6 (six) hours as needed for pain.   120 tablet   1   . traMADol (ULTRAM) 50 MG tablet   Oral   Take 1 tablet (50 mg total) by mouth every 6 (six) hours as needed for pain.   15 tablet   0    BP 134/90  Pulse 77  Temp(Src) 98.2 F (36.8 C) (Oral)  Resp 18  SpO2 98% Physical Exam  Nursing note and vitals reviewed. Constitutional: Vital signs are normal.  She appears well-developed and well-nourished.  Non-toxic appearance. She does not have a sickly appearance. She does not appear ill. No distress.  Neck: Neck supple.  Abdominal: Soft.  Musculoskeletal: She exhibits tenderness.       Left ankle: She exhibits swelling. She exhibits no ecchymosis, no deformity, no laceration and normal pulse. Tenderness. Medial malleolus and head of 5th metatarsal tenderness found. No AITFL tenderness found. Achilles tendon normal.       Feet:  Neurological: She is alert.  Skin: No rash noted. No erythema.    ED Course  Procedures (including critical care time) Labs Reviewed - No data to display Dg Foot Complete Left  02/23/2013   *RADIOLOGY REPORT*  Clinical Data: Left foot pain.  LEFT FOOT - COMPLETE 3+ VIEW  Comparison: None.  Findings: No acute fracture or dislocation is identified. Degenerative changes are seen involving the first MTP joint with associated hallux valgus.  No bony lesions or destruction identified.  Soft tissues are unremarkable.  IMPRESSION: First MTP hallux valgus and degenerative changes.   Original Report Authenticated By: Irish Lack, M.D.   1. Degenerative arthritis   2. Foot pain, right     MDM  Problem #1 Chronic recurrent left foot and ankle pain. X-rays consistent with DJD. No obvious fractures or subluxations. Patient recommended to use a postop shoe for the next 48 hours. Prescribe Cox 2 inhibitor with Ultram for breakthrough pain management. Instructed patient to followup with the orthopedic Dr.  [facility] Parents Prescriptions   MELOXICAM (MOBIC) 7.5 MG TABLET    Take 1 tablet (7.5 mg total) by mouth daily.   TRAMADOL (ULTRAM) 50 MG TABLET    Take 1 tablet (50 mg total) by mouth every 6 (six) hours as needed for pain.       Jimmie Molly, MD 02/23/13 [phone]

## 2013-07-20 ENCOUNTER — Emergency Department (HOSPITAL_COMMUNITY)
Admission: EM | Admit: 2013-07-20 | Discharge: 2013-07-20 | Discharge status: Absent without leave | Payer: PRIVATE HEALTH INSURANCE | Source: Home / Self Care

## 2013-07-28 ENCOUNTER — Ambulatory Visit: Payer: Medicare Other | Admitting: Podiatry

## 2013-08-19 ENCOUNTER — Encounter (HOSPITAL_COMMUNITY): Admission: RE | Payer: Self-pay | Source: Ambulatory Visit

## 2013-08-19 ENCOUNTER — Ambulatory Visit (HOSPITAL_COMMUNITY)
Admission: RE | Admit: 2013-08-19 | Payer: PRIVATE HEALTH INSURANCE | Source: Ambulatory Visit | Admitting: Orthopedic Surgery

## 2013-08-19 SURGERY — SHOULDER ARTHROSCOPY WITH ROTATOR CUFF REPAIR AND SUBACROMIAL DECOMPRESSION
Anesthesia: General | Site: Shoulder | Laterality: Left

## 2013-09-27 ENCOUNTER — Encounter (HOSPITAL_BASED_OUTPATIENT_CLINIC_OR_DEPARTMENT_OTHER): Payer: Self-pay | Admitting: *Deleted

## 2013-09-27 ENCOUNTER — Encounter (HOSPITAL_BASED_OUTPATIENT_CLINIC_OR_DEPARTMENT_OTHER)
Admission: RE | Admit: 2013-09-27 | Discharge: 2013-09-27 | Disposition: A | Discharge status: Home / Self Care | Payer: PRIVATE HEALTH INSURANCE | Source: Ambulatory Visit | Attending: Orthopedic Surgery | Admitting: Orthopedic Surgery

## 2013-09-27 DIAGNOSIS — Z01812 Encounter for preprocedural laboratory examination: Secondary | ICD-10-CM | POA: Insufficient documentation

## 2013-09-27 DIAGNOSIS — Z01810 Encounter for preprocedural cardiovascular examination: Secondary | ICD-10-CM | POA: Insufficient documentation

## 2013-09-27 LAB — BASIC METABOLIC PANEL
BUN: 7 mg/dL (ref 6–23)
CHLORIDE: 102 meq/L (ref 96–112)
CO2: 28 mEq/L (ref 19–32)
Calcium: 8.8 mg/dL (ref 8.4–10.5)
Creatinine, Ser: 0.79 mg/dL (ref 0.50–1.10)
GFR calc Af Amer: >90 mL/min (ref >90)
GFR, EST NON AFRICAN AMERICAN: 88 mL/min — AB (ref >90)
GLUCOSE: 94 mg/dL (ref 70–99)
POTASSIUM: 3.7 meq/L (ref 3.7–5.3)
SODIUM: 143 meq/L (ref 137–147)

## 2013-09-27 NOTE — Progress Notes (Signed)
To come in for bmet-ekg  

## 2013-09-29 ENCOUNTER — Other Ambulatory Visit: Payer: Self-pay | Admitting: Orthopedic Surgery

## 2013-09-29 NOTE — H&P (Signed)
Tina Neal is an 63 y.o. female.   Chief Complaint: Left flat foot deformity with tight heelcord and right hallux ganglion cyst. HPI: Pt reports to OR for surgical correction of left flatfoot deformity and right hallux ganglion cyst.  Pt denies N/V/F/C, chest pain, SOB, calf pain and paresthesia b/l.  Past Medical History  Diagnosis Date  . Asthma   . Hypertension   . GERD (gastroesophageal reflux disease)   . Pancreatitis     history  . Hypothyroidism   . Arthritis     Past Surgical History  Procedure Laterality Date  . Abdominal hysterectomy    . Thyroidectomy    . Rotator cuff      rt  . Knee surgery      right  . Back surgery  2011    lumbar fusion  . Cholecystectomy  2009  . Joint replacement      bilat total knees    Family History  Problem Relation Age of Onset  . Asthma Mother   . Cancer Father    Social History:  reports that she has been smoking Cigarettes.  She has been smoking about 1.00 pack per day. She does not have any smokeless tobacco history on file. She reports that she does not drink alcohol or use illicit drugs.  Allergies:  Allergies  Allergen Reactions  . Hctz [Hydrochlorothiazide] Rash    No prescriptions prior to admission    No results found for this or any previous visit (from the past 48 hour(s)). No results found.  Review of Systems  Constitutional: Negative.   HENT: Negative.   Eyes: Negative.   Respiratory: Negative.   Cardiovascular: Negative.   Gastrointestinal: Negative.   Musculoskeletal: Negative.   Skin: Negative.   Neurological: Negative for loss of consciousness.  Endo/Heme/Allergies: Negative.   Psychiatric/Behavioral: The patient is not nervous/anxious.     Height 5\' 2"  (1.575 m), weight 84.823 kg (187 lb). Physical Exam  WD WN 63y/o female in NAD, A/Ox3, appears stated age. Mood and affect normal, respirations unlabored, EOMI. B/L flatfoot deformity, +TTP to insertion of posterior tibialis and tract of  the post. tib tendon. Ganglion cyst noted to right hallux. Tight heel cord. DP pulse 2+ b/l. Normal sensation to light touch intact.  Assessment/Plan Pt reports to OR for excision of right hallux ganglion cyst as well as left gastroc recession with FDL transfer, calcaneal osteotomy and post. Tib. Tenolysis.  FLOWERS, CHRISTOPHER S 09/29/2013, 3:38 PM   Addendum:  Due to the snowy conditions, the patient was unable to make it in for surgery today.  We'll reschedule the surgery at her convenience.

## 2013-09-30 ENCOUNTER — Encounter (HOSPITAL_BASED_OUTPATIENT_CLINIC_OR_DEPARTMENT_OTHER): Payer: Self-pay | Admitting: Anesthesiology

## 2013-09-30 ENCOUNTER — Ambulatory Visit (HOSPITAL_BASED_OUTPATIENT_CLINIC_OR_DEPARTMENT_OTHER)
Admission: RE | Admit: 2013-09-30 | Payer: PRIVATE HEALTH INSURANCE | Source: Ambulatory Visit | Admitting: Orthopedic Surgery

## 2013-09-30 HISTORY — DX: Hypothyroidism, unspecified: E03.9

## 2013-09-30 HISTORY — DX: Unspecified osteoarthritis, unspecified site: M19.90

## 2013-09-30 SURGERY — EXCISION, GANGLION CYST, FOOT
Anesthesia: General | Laterality: Left

## 2013-10-25 NOTE — Progress Notes (Signed)
Had her ekg and bmet done 09/27/13-then snowed dos-r/s-no more labs needed

## 2013-10-27 ENCOUNTER — Other Ambulatory Visit: Payer: Self-pay | Admitting: Orthopedic Surgery

## 2013-10-27 NOTE — H&P (Signed)
Tina Neal is an 63 y.o. female.   Chief Complaint: Right great toe mass; left adult flat foot deformity and tight heel cord HPI: Pt reports to the OR for excision of right great toe ganglion cyst, left gastroc recession, FDL transfer, post tib tenolysis and calcaneal osteotomy. Pt denies N/V/F/C, chest pain, SOB, calf pain or paresthesia b/l.   Past Medical History  Diagnosis Date  . Asthma   . Hypertension   . GERD (gastroesophageal reflux disease)   . Pancreatitis     history  . Hypothyroidism   . Arthritis     Past Surgical History  Procedure Laterality Date  . Abdominal hysterectomy    . Thyroidectomy    . Rotator cuff      rt  . Knee surgery      right  . Back surgery  2011    lumbar fusion  . Cholecystectomy  2009  . Joint replacement      bilat total knees    Family History  Problem Relation Age of Onset  . Asthma Mother   . Cancer Father    Social History:  reports that she has been smoking Cigarettes.  She has been smoking about 1.00 pack per day. She does not have any smokeless tobacco history on file. She reports that she does not drink alcohol or use illicit drugs.  Allergies:  Allergies  Allergen Reactions  . Hctz [Hydrochlorothiazide] Rash    No prescriptions prior to admission    No results found for this or any previous visit (from the past 48 hour(s)). No results found.  Review of Systems  Constitutional: Negative.   HENT: Negative.   Eyes: Negative.   Respiratory: Negative.   Cardiovascular: Negative.   Gastrointestinal: Negative.   Musculoskeletal: Negative.   Skin: Negative.   Neurological: Negative for tremors.  Endo/Heme/Allergies: Negative.   Psychiatric/Behavioral: The patient is not nervous/anxious.     Height 5\' 2"  (1.575 m), weight 84.823 kg (187 lb). Physical Exam  63y/o WD WN female in NAD, A/Ox3, appears stated age.  EOMI, mood and affect normal, respirations unlabored.  Gait heel toe reciprocal b/l with antalgia to  the left.  +TTP to insertion and track of the posterior tibial tendon. Strength 5/5 with DF, PF, and eversion with pain recreated with plantarflexed inversion of the left ankle.  DP pulses 2+ b/l.  Normal sensation to light touch intact.  Assessment/Plan Pt reports to OR for excision for the right hallux ganglion cyst and surgical correction of the adult flat foot deformity.  FLOWERS, CHRISTOPHER S 10/27/2013, 3:47 PM  Agree with note above.  Pt seen and examined this morning.  We discussed the ganglion cyst on the right hallux again and have decided to postpone that surgery.  We'll proceed with the left flatfoot reconstruction.  The risks and benefits of the alternative treatment options have been discussed in detail.  The patient wishes to proceed with surgery and specifically understands risks of bleeding, infection, nerve damage, blood clots, need for additional surgery, amputation and death.

## 2013-10-28 ENCOUNTER — Ambulatory Visit (HOSPITAL_BASED_OUTPATIENT_CLINIC_OR_DEPARTMENT_OTHER)
Admission: RE | Admit: 2013-10-28 | Discharge: 2013-10-28 | Disposition: A | Discharge status: Home / Self Care | Payer: PRIVATE HEALTH INSURANCE | Source: Ambulatory Visit | Attending: Orthopedic Surgery | Admitting: Orthopedic Surgery

## 2013-10-28 ENCOUNTER — Encounter (HOSPITAL_BASED_OUTPATIENT_CLINIC_OR_DEPARTMENT_OTHER)
Admission: RE | Disposition: A | Discharge status: Home / Self Care | Payer: Self-pay | Source: Ambulatory Visit | Attending: Orthopedic Surgery

## 2013-10-28 ENCOUNTER — Ambulatory Visit (HOSPITAL_BASED_OUTPATIENT_CLINIC_OR_DEPARTMENT_OTHER): Payer: PRIVATE HEALTH INSURANCE | Admitting: Certified Registered"

## 2013-10-28 ENCOUNTER — Encounter (HOSPITAL_BASED_OUTPATIENT_CLINIC_OR_DEPARTMENT_OTHER): Payer: Self-pay | Admitting: Certified Registered"

## 2013-10-28 ENCOUNTER — Encounter (HOSPITAL_BASED_OUTPATIENT_CLINIC_OR_DEPARTMENT_OTHER): Payer: PRIVATE HEALTH INSURANCE | Admitting: Certified Registered"

## 2013-10-28 DIAGNOSIS — M129 Arthropathy, unspecified: Secondary | ICD-10-CM | POA: Insufficient documentation

## 2013-10-28 DIAGNOSIS — I10 Essential (primary) hypertension: Secondary | ICD-10-CM | POA: Insufficient documentation

## 2013-10-28 DIAGNOSIS — J4489 Other specified chronic obstructive pulmonary disease: Secondary | ICD-10-CM | POA: Insufficient documentation

## 2013-10-28 DIAGNOSIS — K219 Gastro-esophageal reflux disease without esophagitis: Secondary | ICD-10-CM | POA: Insufficient documentation

## 2013-10-28 DIAGNOSIS — J449 Chronic obstructive pulmonary disease, unspecified: Secondary | ICD-10-CM | POA: Insufficient documentation

## 2013-10-28 DIAGNOSIS — M6740 Ganglion, unspecified site: Secondary | ICD-10-CM | POA: Insufficient documentation

## 2013-10-28 DIAGNOSIS — M6240 Contracture of muscle, unspecified site: Secondary | ICD-10-CM | POA: Insufficient documentation

## 2013-10-28 DIAGNOSIS — F17200 Nicotine dependence, unspecified, uncomplicated: Secondary | ICD-10-CM | POA: Insufficient documentation

## 2013-10-28 DIAGNOSIS — M2140 Flat foot [pes planus] (acquired), unspecified foot: Secondary | ICD-10-CM | POA: Insufficient documentation

## 2013-10-28 DIAGNOSIS — E039 Hypothyroidism, unspecified: Secondary | ICD-10-CM | POA: Insufficient documentation

## 2013-10-28 HISTORY — PX: TENDON TRANSFER: SHX6109

## 2013-10-28 HISTORY — PX: CALCANEAL OSTEOTOMY: SHX1281

## 2013-10-28 HISTORY — PX: GASTROC RECESSION EXTREMITY: SHX6262

## 2013-10-28 LAB — POCT HEMOGLOBIN-HEMACUE: HEMOGLOBIN: 12.8 g/dL (ref 12.0–15.0)

## 2013-10-28 SURGERY — OSTEOTOMY, CALCANEUS
Anesthesia: General | Site: Leg Lower | Laterality: Left

## 2013-10-28 MED ORDER — 0.9 % SODIUM CHLORIDE (POUR BTL) OPTIME
TOPICAL | Status: DC | PRN
  Administered 2013-10-28: 300 mL

## 2013-10-28 MED ORDER — OXYCODONE HCL 5 MG PO TABS: 5.0000 mg | ORAL_TABLET | ORAL | Status: DC | PRN

## 2013-10-28 MED ORDER — LIDOCAINE HCL (CARDIAC) 20 MG/ML IV SOLN
INTRAVENOUS | Status: DC | PRN
  Administered 2013-10-28: 30 mg via INTRAVENOUS

## 2013-10-28 MED ORDER — MIDAZOLAM HCL 2 MG/2ML IJ SOLN
INTRAMUSCULAR | Status: AC
  Filled 2013-10-28: qty 2

## 2013-10-28 MED ORDER — SODIUM CHLORIDE 0.9 % IV SOLN: INTRAVENOUS | Status: DC

## 2013-10-28 MED ORDER — DEXAMETHASONE SODIUM PHOSPHATE 10 MG/ML IJ SOLN
INTRAMUSCULAR | Status: DC | PRN
  Administered 2013-10-28: 10 mg via INTRAVENOUS

## 2013-10-28 MED ORDER — FENTANYL CITRATE 0.05 MG/ML IJ SOLN
50.0000 ug | INTRAMUSCULAR | Status: DC | PRN
  Administered 2013-10-28: 100 ug via INTRAVENOUS

## 2013-10-28 MED ORDER — HYDROMORPHONE HCL PF 1 MG/ML IJ SOLN
INTRAMUSCULAR | Status: AC
Start: 2013-10-28 — End: 2013-10-28
  Filled 2013-10-28: qty 1

## 2013-10-28 MED ORDER — OXYCODONE HCL 5 MG PO TABS
ORAL_TABLET | ORAL | Status: AC
Start: 2013-10-28 — End: 2013-10-28
  Filled 2013-10-28: qty 1

## 2013-10-28 MED ORDER — HYDROMORPHONE HCL PF 1 MG/ML IJ SOLN
INTRAMUSCULAR | Status: AC
  Filled 2013-10-28: qty 1

## 2013-10-28 MED ORDER — CHLORHEXIDINE GLUCONATE 4 % EX LIQD: 60.0000 mL | Freq: Once | CUTANEOUS | Status: DC

## 2013-10-28 MED ORDER — ACETAMINOPHEN 500 MG PO TABS
ORAL_TABLET | ORAL | Status: AC
  Filled 2013-10-28: qty 2

## 2013-10-28 MED ORDER — MIDAZOLAM HCL 2 MG/2ML IJ SOLN
1.0000 mg | INTRAMUSCULAR | Status: DC | PRN
  Administered 2013-10-28: 2 mg via INTRAVENOUS

## 2013-10-28 MED ORDER — FENTANYL CITRATE 0.05 MG/ML IJ SOLN
INTRAMUSCULAR | Status: AC
  Filled 2013-10-28: qty 2

## 2013-10-28 MED ORDER — FENTANYL CITRATE 0.05 MG/ML IJ SOLN
INTRAMUSCULAR | Status: AC
  Filled 2013-10-28: qty 6

## 2013-10-28 MED ORDER — CEFAZOLIN SODIUM-DEXTROSE 2-3 GM-% IV SOLR
INTRAVENOUS | Status: AC
  Filled 2013-10-28: qty 50

## 2013-10-28 MED ORDER — LACTATED RINGERS IV SOLN
INTRAVENOUS | Status: DC
  Administered 2013-10-28 (×2): via INTRAVENOUS

## 2013-10-28 MED ORDER — ONDANSETRON HCL 4 MG/2ML IJ SOLN
INTRAMUSCULAR | Status: DC | PRN
Start: 2013-10-28 — End: 2013-10-28
  Administered 2013-10-28: 4 mg via INTRAVENOUS

## 2013-10-28 MED ORDER — CEFAZOLIN SODIUM-DEXTROSE 2-3 GM-% IV SOLR: 2.0000 g | INTRAVENOUS | Status: DC

## 2013-10-28 MED ORDER — PROPOFOL 10 MG/ML IV BOLUS
INTRAVENOUS | Status: DC | PRN
Start: 2013-10-28 — End: 2013-10-28
  Administered 2013-10-28: 250 mg via INTRAVENOUS

## 2013-10-28 MED ORDER — OXYCODONE HCL 5 MG PO TABS
5.0000 mg | ORAL_TABLET | Freq: Once | ORAL | Status: AC | PRN
  Administered 2013-10-28: 5 mg via ORAL

## 2013-10-28 MED ORDER — OXYCODONE HCL 5 MG/5ML PO SOLN: 5.0000 mg | Freq: Once | ORAL | Status: AC | PRN

## 2013-10-28 MED ORDER — CEFAZOLIN SODIUM-DEXTROSE 2-3 GM-% IV SOLR
2.0000 g | INTRAVENOUS | Status: AC
  Administered 2013-10-28: 2 g via INTRAVENOUS

## 2013-10-28 MED ORDER — MIDAZOLAM HCL 5 MG/5ML IJ SOLN
INTRAMUSCULAR | Status: DC | PRN
  Administered 2013-10-28: 2 mg via INTRAVENOUS

## 2013-10-28 MED ORDER — BUPIVACAINE-EPINEPHRINE PF 0.5-1:200000 % IJ SOLN
INTRAMUSCULAR | Status: AC
  Filled 2013-10-28: qty 30

## 2013-10-28 MED ORDER — ONDANSETRON HCL 4 MG/2ML IJ SOLN: 4.0000 mg | Freq: Once | INTRAMUSCULAR | Status: DC | PRN

## 2013-10-28 MED ORDER — FENTANYL CITRATE 0.05 MG/ML IJ SOLN
INTRAMUSCULAR | Status: DC | PRN
  Administered 2013-10-28 (×2): 50 ug via INTRAVENOUS

## 2013-10-28 MED ORDER — ROPIVACAINE HCL 5 MG/ML IJ SOLN
INTRAMUSCULAR | Status: DC | PRN
  Administered 2013-10-28: 20 mL via PERINEURAL

## 2013-10-28 MED ORDER — BACITRACIN ZINC 500 UNIT/GM EX OINT
TOPICAL_OINTMENT | CUTANEOUS | Status: AC
  Filled 2013-10-28: qty 28.35

## 2013-10-28 MED ORDER — RIVAROXABAN 10 MG PO TABS: 10.0000 mg | ORAL_TABLET | Freq: Every day | ORAL | Status: DC

## 2013-10-28 MED ORDER — ACETAMINOPHEN 500 MG PO TABS
1000.0000 mg | ORAL_TABLET | Freq: Once | ORAL | Status: AC
  Administered 2013-10-28: 1000 mg via ORAL

## 2013-10-28 MED ORDER — BACITRACIN ZINC 500 UNIT/GM EX OINT
TOPICAL_OINTMENT | CUTANEOUS | Status: DC | PRN
  Administered 2013-10-28: 1 via TOPICAL

## 2013-10-28 MED ORDER — BUPIVACAINE-EPINEPHRINE PF 0.5-1:200000 % IJ SOLN
INTRAMUSCULAR | Status: DC | PRN
  Administered 2013-10-28: 20 mL via PERINEURAL

## 2013-10-28 MED ORDER — HYDROMORPHONE HCL PF 1 MG/ML IJ SOLN
0.2500 mg | INTRAMUSCULAR | Status: DC | PRN
  Administered 2013-10-28 (×3): 0.5 mg via INTRAVENOUS

## 2013-10-28 SURGICAL SUPPLY — 111 items
BANDAGE ESMARK 6X9 LF (GAUZE/BANDAGES/DRESSINGS) ×3 IMPLANT
BLADE AVERAGE 25MMX9MM (BLADE)
BLADE AVERAGE 25X9 (BLADE) IMPLANT
BLADE CCA MICRO SAG (BLADE) IMPLANT
BLADE MICRO SAGITTAL (BLADE) ×5 IMPLANT
BLADE MINI RND TIP GREEN BEAV (BLADE) IMPLANT
BLADE SURG 11 STRL SS (BLADE) IMPLANT
BLADE SURG 15 STRL LF DISP TIS (BLADE) ×7 IMPLANT
BLADE SURG 15 STRL SS (BLADE) ×15
BNDG CMPR 9X4 STRL LF SNTH (GAUZE/BANDAGES/DRESSINGS)
BNDG CMPR 9X6 STRL LF SNTH (GAUZE/BANDAGES/DRESSINGS) ×3
BNDG COHESIVE 4X5 TAN STRL (GAUZE/BANDAGES/DRESSINGS) ×5 IMPLANT
BNDG COHESIVE 6X5 TAN STRL LF (GAUZE/BANDAGES/DRESSINGS) ×5 IMPLANT
BNDG CONFORM 3 STRL LF (GAUZE/BANDAGES/DRESSINGS) IMPLANT
BNDG ESMARK 4X9 LF (GAUZE/BANDAGES/DRESSINGS) IMPLANT
BNDG ESMARK 6X9 LF (GAUZE/BANDAGES/DRESSINGS) ×5
BUR EGG 3PK/BX (BURR) IMPLANT
CANISTER SUCT 1200ML W/VALVE (MISCELLANEOUS) ×5 IMPLANT
CHLORAPREP W/TINT 26ML (MISCELLANEOUS) ×5 IMPLANT
CLOSURE WOUND 1/2 X4 (GAUZE/BANDAGES/DRESSINGS)
COVER TABLE BACK 60X90 (DRAPES) ×5 IMPLANT
CUFF TOURNIQUET SINGLE 34IN LL (TOURNIQUET CUFF) ×5 IMPLANT
DRAPE C-ARM 42X72 X-RAY (DRAPES) IMPLANT
DRAPE C-ARMOR (DRAPES) IMPLANT
DRAPE EXTREMITY T 121X128X90 (DRAPE) ×5 IMPLANT
DRAPE OEC MINIVIEW 54X84 (DRAPES) ×3 IMPLANT
DRAPE PED LAPAROTOMY (DRAPES) IMPLANT
DRAPE SURG 17X23 STRL (DRAPES) IMPLANT
DRAPE U 20/CS (DRAPES) IMPLANT
DRAPE U-SHAPE 47X51 STRL (DRAPES) ×5 IMPLANT
DRSG ADAPTIC 3X8 NADH LF (GAUZE/BANDAGES/DRESSINGS) ×2 IMPLANT
DRSG EMULSION OIL 3X3 NADH (GAUZE/BANDAGES/DRESSINGS) ×9 IMPLANT
DRSG PAD ABDOMINAL 8X10 ST (GAUZE/BANDAGES/DRESSINGS) ×10 IMPLANT
DRSG TEGADERM 4X4.75 (GAUZE/BANDAGES/DRESSINGS) IMPLANT
ELECT REM PT RETURN 9FT ADLT (ELECTROSURGICAL) ×5
ELECTRODE REM PT RTRN 9FT ADLT (ELECTROSURGICAL) ×3 IMPLANT
GAUZE SPONGE 4X4 16PLY XRAY LF (GAUZE/BANDAGES/DRESSINGS) IMPLANT
GLOVE BIO SURGEON STRL SZ8 (GLOVE) ×5 IMPLANT
GLOVE BIOGEL PI IND STRL 7.0 (GLOVE) ×1 IMPLANT
GLOVE BIOGEL PI IND STRL 7.5 (GLOVE) ×2 IMPLANT
GLOVE BIOGEL PI IND STRL 8 (GLOVE) ×3 IMPLANT
GLOVE BIOGEL PI INDICATOR 7.0 (GLOVE) ×2
GLOVE BIOGEL PI INDICATOR 7.5 (GLOVE)
GLOVE BIOGEL PI INDICATOR 8 (GLOVE) ×2
GLOVE ECLIPSE 6.5 STRL STRAW (GLOVE) ×3 IMPLANT
GLOVE ECLIPSE 7.0 STRL STRAW (GLOVE) ×2 IMPLANT
GLOVE EXAM NITRILE MD LF STRL (GLOVE) ×3 IMPLANT
GOWN STRL REUS W/ TWL LRG LVL3 (GOWN DISPOSABLE) ×4 IMPLANT
GOWN STRL REUS W/ TWL XL LVL3 (GOWN DISPOSABLE) ×3 IMPLANT
GOWN STRL REUS W/TWL LRG LVL3 (GOWN DISPOSABLE) ×5
GOWN STRL REUS W/TWL XL LVL3 (GOWN DISPOSABLE) ×5
GUIDEWIRE 1.1X6IN (WIRE) ×4 IMPLANT
KIT BIO-TENODESIS 3X8 DISP (MISCELLANEOUS)
KIT INSRT BABSR STRL DISP BTN (MISCELLANEOUS) IMPLANT
NDL HYPO 25X1 1.5 SAFETY (NEEDLE) IMPLANT
NDL SUT 6 .5 CRC .975X.05 MAYO (NEEDLE) ×1 IMPLANT
NEEDLE HYPO 22GX1.5 SAFETY (NEEDLE) IMPLANT
NEEDLE HYPO 25X1 1.5 SAFETY (NEEDLE) IMPLANT
NEEDLE MAYO TAPER (NEEDLE) ×5
NS IRRIG 1000ML POUR BTL (IV SOLUTION) ×5 IMPLANT
PACK BASIN DAY SURGERY FS (CUSTOM PROCEDURE TRAY) ×5 IMPLANT
PAD CAST 4YDX4 CTTN HI CHSV (CAST SUPPLIES) ×6 IMPLANT
PADDING CAST ABS 4INX4YD NS (CAST SUPPLIES)
PADDING CAST ABS COTTON 4X4 ST (CAST SUPPLIES) IMPLANT
PADDING CAST COTTON 4X4 STRL (CAST SUPPLIES) ×10
PADDING CAST COTTON 6X4 STRL (CAST SUPPLIES) ×5 IMPLANT
PASSER SUT SWANSON 36MM LOOP (INSTRUMENTS) ×4 IMPLANT
PENCIL BUTTON HOLSTER BLD 10FT (ELECTRODE) ×5 IMPLANT
PIN GUIDE DRILL TIP 2.8X300 (DRILL) ×6 IMPLANT
SANITIZER HAND PURELL 535ML FO (MISCELLANEOUS) ×5 IMPLANT
SCREW CANN 45X16X6.5 (Screw) ×1 IMPLANT
SCREW CANN 6.5X40 (Screw) ×3 IMPLANT
SCREW CANN 6.5X45 (Screw) ×5 IMPLANT
SCREW CANN RATTLER 5X20 (Screw) ×4 IMPLANT
SHEET MEDIUM DRAPE 40X70 STRL (DRAPES) ×5 IMPLANT
SLEEVE SCD COMPRESS KNEE MED (MISCELLANEOUS) ×5 IMPLANT
SPLINT FAST PLASTER 5X30 (CAST SUPPLIES) ×40
SPLINT PLASTER CAST FAST 5X30 (CAST SUPPLIES) ×60 IMPLANT
SPONGE GAUZE 4X4 12PLY (GAUZE/BANDAGES/DRESSINGS) ×10 IMPLANT
SPONGE LAP 18X18 X RAY DECT (DISPOSABLE) ×5 IMPLANT
STOCKINETTE 6  STRL (DRAPES) ×2
STOCKINETTE 6 STRL (DRAPES) ×3 IMPLANT
STRIP CLOSURE SKIN 1/2X4 (GAUZE/BANDAGES/DRESSINGS) IMPLANT
SUCTION FRAZIER TIP 10 FR DISP (SUCTIONS) ×5 IMPLANT
SUT 2 FIBERLOOP 20 STRT BLUE (SUTURE)
SUT BONE WAX W31G (SUTURE) IMPLANT
SUT ETHIBOND 2 OS 4 DA (SUTURE) IMPLANT
SUT ETHILON 3 0 PS 1 (SUTURE) ×9 IMPLANT
SUT ETHILON 4 0 PS 2 18 (SUTURE) IMPLANT
SUT FIBERWIRE #2 38 T-5 BLUE (SUTURE) ×5
SUT FIBERWIRE 2-0 18 17.9 3/8 (SUTURE)
SUT MNCRL AB 3-0 PS2 18 (SUTURE) ×5 IMPLANT
SUT MNCRL AB 4-0 PS2 18 (SUTURE) IMPLANT
SUT VIC AB 0 SH 27 (SUTURE) ×8 IMPLANT
SUT VIC AB 2-0 PS2 27 (SUTURE) IMPLANT
SUT VIC AB 2-0 SH 27 (SUTURE)
SUT VIC AB 2-0 SH 27XBRD (SUTURE) IMPLANT
SUT VIC AB 3-0 PS1 18 (SUTURE)
SUT VIC AB 3-0 PS1 18XBRD (SUTURE) IMPLANT
SUT VICRYL 4-0 PS2 18IN ABS (SUTURE) IMPLANT
SUTURE 2 FIBERLOOP 20 STRT BLU (SUTURE) IMPLANT
SUTURE FIBERWR #2 38 T-5 BLUE (SUTURE) ×1 IMPLANT
SUTURE FIBERWR 2-0 18 17.9 3/8 (SUTURE) IMPLANT
SYR BULB 3OZ (MISCELLANEOUS) ×5 IMPLANT
SYR CONTROL 10ML LL (SYRINGE) IMPLANT
TOWEL OR 17X24 6PK STRL BLUE (TOWEL DISPOSABLE) ×9 IMPLANT
TOWEL OR NON WOVEN STRL DISP B (DISPOSABLE) ×2 IMPLANT
TUBE CONNECTING 20'X1/4 (TUBING) ×1
TUBE CONNECTING 20X1/4 (TUBING) ×4 IMPLANT
UNDERPAD 30X30 INCONTINENT (UNDERPADS AND DIAPERS) ×5 IMPLANT
YANKAUER SUCT BULB TIP NO VENT (SUCTIONS) IMPLANT

## 2013-10-28 NOTE — Brief Op Note (Signed)
10/28/2013  11:49 AM  PATIENT:  Tina Neal  62 y.o. female  PRE-OPERATIVE DIAGNOSIS:  Left adult flatfoot deformity with tight heelcord  POST-OPERATIVE DIAGNOSIS:  same  Procedure(s): LEFT CALCANEAL OSTEOTOMY LEFT GASTROCNEMIUS RECESSION  LEFT FLEXOR DIGITORUM LONGUS TRANSFER to the navicular Left POSTERIOR TIBIAL TENOLYSIS Left spring ligament reefing AP, lateral and oblique radiographs of the left foot  SURGEON:  Wylene Simmer, MD  ASSISTANT: n/a  ANESTHESIA:   General, regional  EBL:  minimal   TOURNIQUET:   Total Tourniquet Time Documented: Thigh (Left) - 75 minutes Total: Thigh (Left) - 75 minutes  COMPLICATIONS:  None apparent  DISPOSITION:  Extubated, awake and stable to recovery.  DICTATION ID:  [id number]

## 2013-10-28 NOTE — Anesthesia Procedure Notes (Addendum)
Anesthesia Regional Block:  Popliteal block  Pre-Anesthetic Checklist: ,, timeout performed, Correct Patient, Correct Site, Correct Laterality, Correct Procedure, Correct Position, site marked, Risks and benefits discussed,  Surgical consent,  Pre-op evaluation,  At surgeon's request and post-op pain management  Laterality: Left and Lower  Prep: chloraprep       Needles:  Injection technique: Single-shot  Needle Type: Echogenic Needle     Needle Length: 9cm 9 cm Needle Gauge: 21 and 21 G    Additional Needles:  Procedures: ultrasound guided (picture in chart) Popliteal block Narrative:  Start time: 10/28/2013 9:21 AM End time: 10/28/2013 9:27 AM Injection made incrementally with aspirations every 5 mL.  Performed by: Personally  Anesthesiologist: Lorrene Reid, MD   Anesthesia Regional Block:  Adductor canal block  Pre-Anesthetic Checklist: ,, timeout performed, Correct Patient, Correct Site, Correct Laterality, Correct Procedure, Correct Position, site marked, Risks and benefits discussed,  Surgical consent,  Pre-op evaluation,  At surgeon's request and post-op pain management  Laterality: Left and Lower  Prep: chloraprep       Needles:  Injection technique: Single-shot  Needle Type: Echogenic Needle     Needle Length: 9cm 9 cm Needle Gauge: 21 and 21 G    Additional Needles:  Procedures: ultrasound guided (picture in chart) Adductor canal block Narrative:  Start time: 10/28/2013 9:27 AM End time: 10/28/2013 9:30 AM Injection made incrementally with aspirations every 5 mL.  Performed by: Personally  Anesthesiologist: Lorrene Reid, MD   Procedure Name: Intubation Date/Time: 10/28/2013 10:07 AM Performed by: Niana Martorana Pre-anesthesia Checklist: Patient identified, Emergency Drugs available, Suction available and Patient being monitored Patient Re-evaluated:Patient Re-evaluated prior to inductionOxygen Delivery Method: Circle System  Utilized Preoxygenation: Pre-oxygenation with 100% oxygen Intubation Type: IV induction Ventilation: Mask ventilation without difficulty Laryngoscope Size: Mac and 3 Grade View: Grade I Tube type: Oral Tube size: 7.0 mm Number of attempts: 1 Airway Equipment and Method: stylet Placement Confirmation: ETT inserted through vocal cords under direct vision,  positive ETCO2 and breath sounds checked- equal and bilateral Secured at: 21 cm Tube secured with: Tape Dental Injury: Teeth and Oropharynx as per pre-operative assessment  Comments: Unable to seat number 4 or 3 LMA with proper placement for adequate ventilation. Dr Al Corpus in room decision for ETT, easy mask ventilation, DL x 1 with grade one view, easy atraumatic placement of 7.0 ETT, BBS + ,ETCO2 ,dentition unchanged

## 2013-10-28 NOTE — Discharge Instructions (Addendum)
Tina Simmer, MD [place]  Please read the following information regarding your care after surgery.  Medications  You only need a prescription for the narcotic pain medicine (ex. oxycodone, Percocet, Norco).  All of the other medicines listed below are available over the counter. X acetominophen (Tylenol) 650 mg every 4-6 hours as you need for minor pain X oxycodone as prescribed for moderate to severe pain   Narcotic pain medicine (ex. oxycodone, Percocet, Vicodin) will cause constipation.  To prevent this problem, take the following medicines while you are taking any pain medicine. X docusate sodium (Colace) 100 mg twice a day X senna (Senokot) 2 tablets twice a day  X To help prevent blood clots, take Xarelto for two weeks after surgery.  Then take an aspirin (325 mg) once a day for a month.  You should also get up every hour while you are awake to move around.    Weight Bearing ? Bear weight when you are able on your operated leg or foot. ? Bear weight only on the heel of your operated foot in the post-op shoe. X Do not bear any weight on the operated leg or foot.  Cast / Splint / Dressing X Keep your splint or cast clean and dry.  Dont put anything (coat hanger, pencil, etc) down inside of it.  If it gets damp, use a hair dryer on the cool setting to dry it.  If it gets soaked, call the office to schedule an appointment for a cast change. ? Remove your dressing 3 days after surgery and cover the incisions with dry dressings.    After your dressing, cast or splint is removed; you may shower, but do not soak or scrub the wound.  Allow the water to run over it, and then gently pat it dry.  Swelling It is normal for you to have swelling where you had surgery.  To reduce swelling and pain, keep your toes above your nose for at least 3 days after surgery.  It may be necessary to keep your foot or leg elevated for several weeks.  If it hurts, it should be elevated.  Follow  Up Call my office at [phone] when you are discharged from the hospital or surgery center to schedule an appointment to be seen two weeks after surgery.  Call my office at [phone] if you develop a fever >101.5 F, nausea, vomiting, bleeding from the surgical site or severe pain.   Post Anesthesia Home Care Instructions  Activity: Get plenty of rest for the remainder of the day. A responsible adult should stay with you for 24 hours following the procedure.  For the next 24 hours, DO NOT: -Drive a car -Paediatric nurse -Drink alcoholic beverages -Take any medication unless instructed by your physician -Make any legal decisions or sign important papers.  Meals: Start with liquid foods such as gelatin or soup. Progress to regular foods as tolerated. Avoid greasy, spicy, heavy foods. If nausea and/or vomiting occur, drink only clear liquids until the nausea and/or vomiting subsides. Call your physician if vomiting continues.  Special Instructions/Symptoms: Your throat may feel dry or sore from the anesthesia or the breathing tube placed in your throat during surgery. If this causes discomfort, gargle with warm salt water. The discomfort should disappear within 24 hours.   Regional Anesthesia Blocks  1. Numbness or the inability to move the "blocked" extremity may last from 3-48 hours after placement. The length of time depends on the medication injected and your  individual response to the medication. If the numbness is not going away after 48 hours, call your surgeon.  2. The extremity that is blocked will need to be protected until the numbness is gone and the  Strength has returned. Because you cannot feel it, you will need to take extra care to avoid injury. Because it may be weak, you may have difficulty moving it or using it. You may not know what position it is in without looking at it while the block is in effect.  3. For blocks in the legs and feet, returning to weight  bearing and walking needs to be done carefully. You will need to wait until the numbness is entirely gone and the strength has returned. You should be able to move your leg and foot normally before you try and bear weight or walk. You will need someone to be with you when you first try to ensure you do not fall and possibly risk injury.  4. Bruising and tenderness at the needle site are common side effects and will resolve in a few days.  5. Persistent numbness or new problems with movement should be communicated to the surgeon or the [facility] [phone] [facility] [phone]).

## 2013-10-28 NOTE — Anesthesia Postprocedure Evaluation (Signed)
  Anesthesia Post-op Note  Patient: Tina Neal  Procedure(s) Performed: Procedure(s): LEFT CALCANEAL OSTEOTOMY (Left) LEFT GASTROCNEMIUS RECESSION  (Left) LEFT FLEXOR DIGITORUM LONGUS TRANSFER, POSTERIOR TIBIAL TENOLYSIS (Left)  Patient Location: PACU  Anesthesia Type:GA combined with regional for post-op pain  Level of Consciousness: awake  Airway and Oxygen Therapy: Patient Spontanous Breathing and Patient connected to face mask oxygen  Post-op Pain: moderate  Post-op Assessment: Post-op Vital signs reviewed  Post-op Vital Signs: Reviewed  Complications: No apparent anesthesia complications

## 2013-10-28 NOTE — Anesthesia Preprocedure Evaluation (Signed)
Anesthesia Evaluation  Patient identified by MRN, date of birth, ID band Patient awake    Reviewed: Allergy & Precautions, H&P , NPO status , Patient's Chart, lab work & pertinent test results  Airway Mallampati: I TM Distance: >3 FB Neck ROM: Full    Dental  (+) Teeth Intact   Pulmonary asthma , COPDCurrent Smoker,  breath sounds clear to auscultation        Cardiovascular hypertension, Pt. on medications Rhythm:Regular Rate:Normal     Neuro/Psych    GI/Hepatic GERD-  Medicated and Controlled,  Endo/Other    Renal/GU      Musculoskeletal   Abdominal   Peds  Hematology   Anesthesia Other Findings   Reproductive/Obstetrics                           Anesthesia Physical Anesthesia Plan  ASA: III  Anesthesia Plan: General   Post-op Pain Management: MAC Combined w/ Regional for Post-op pain   Induction: Intravenous  Airway Management Planned: LMA  Additional Equipment:   Intra-op Plan:   Post-operative Plan: Extubation in OR  Informed Consent: I have reviewed the patients History and Physical, chart, labs and discussed the procedure including the risks, benefits and alternatives for the proposed anesthesia with the patient or authorized representative who has indicated his/her understanding and acceptance.   Dental advisory given  Plan Discussed with: CRNA, Anesthesiologist and Surgeon  Anesthesia Plan Comments:         Anesthesia Quick Evaluation

## 2013-10-28 NOTE — Transfer of Care (Signed)
Immediate Anesthesia Transfer of Care Note  Patient: [employer] [place]  Procedure(s) Performed: Procedure(s): LEFT CALCANEAL OSTEOTOMY (Left) LEFT GASTROCNEMIUS RECESSION  (Left) LEFT FLEXOR DIGITORUM LONGUS TRANSFER, POSTERIOR TIBIAL TENOLYSIS (Left)  Patient Location: PACU  Anesthesia Type:GA combined with regional for post-op pain  Level of Consciousness: awake, alert , oriented and patient cooperative  Airway & Oxygen Therapy: Patient Spontanous Breathing and Patient connected to face mask oxygen  Post-op Assessment: Report given to PACU RN and Post -op Vital signs reviewed and stable  Post vital signs: Reviewed and stable  Complications: No apparent anesthesia complications

## 2013-10-28 NOTE — Progress Notes (Signed)
Assisted Dr. Crews with left, ultrasound guided, popliteal/saphenous block. Side rails up, monitors on throughout procedure. See vital signs in flow sheet. Tolerated Procedure well. 

## 2013-10-29 ENCOUNTER — Encounter (HOSPITAL_BASED_OUTPATIENT_CLINIC_OR_DEPARTMENT_OTHER): Payer: Self-pay | Admitting: Orthopedic Surgery

## 2013-10-29 NOTE — Op Note (Signed)
NAME:  Tina Neal, Tina Neal               ACCOUNT NO.:  [id number]  MEDICAL RECORD NO.:  [id number]  LOCATION:                                 FACILITY:  PHYSICIAN:  Wylene Simmer, MD        DATE OF BIRTH:  [birth year]  DATE OF PROCEDURE:  10/28/2013 DATE OF DISCHARGE:                              OPERATIVE REPORT   PREOPERATIVE DIAGNOSIS:  Left adult flatfoot deformity and tight heel cord.  POSTOPERATIVE DIAGNOSIS:  Left adult flatfoot deformity and tight heel cord.  PROCEDURE: 1. Left calcaneal medializing osteotomy through a separate incision. 2. Left gastrocnemius recession. 3. Left flexor digitorum longus transfer to the navicular. 4. Left posterior tibial tendon tenolysis. 5. Left spring ligament reefing. 6. AP, lateral, and oblique radiographs of left foot.  SURGEON:  Wylene Simmer, MD  ANESTHESIA:  General, regional.  ESTIMATED BLOOD LOSS:  Minimal.  TOURNIQUET TIME:  75 minutes at 220 mmHg.  COMPLICATIONS:  None apparent.  DISPOSITION:  Extubated, awake and stable to recovery.  INDICATIONS FOR PROCEDURE:  The patient is a 63 year old woman with an adult flatfoot deformity on the left that is quite painful.  She has a tight heel cord.  She has failed nonoperative treatment to date including activity modification, oral anti-inflammatories and orthotics. She presents now for operative treatment of this painful condition.  She understands the risks and benefits, the alternative treatment options and elects surgical treatment.  She specifically understands risks of bleeding, infection, nerve damage, blood clots, need for additional surgery, amputation, and death.  PROCEDURE IN DETAIL:  After preoperative consent was obtained and the correct operative site was identified, the patient was brought to the operating room and placed supine on the operating table.  General anesthesia was induced.  Preoperative antibiotics were administered. Surgical time-out was taken.  Left  lower extremity was prepped and draped in standard sterile fashion with tourniquet around the thigh. The extremity was exsanguinated.  The tourniquet was inflated to 220 mmHg.  A longitudinal incision was made over the medial calf.  Sharp dissection was carried down through the skin and subcutaneous tissue. The superficial fascia was incised.  The gastrocnemius tendon was identified.  It was divided from medial to lateral under direct vision taking care to protect the sural nerve posteriorly.  The plantaris tendon was also divided under direct vision.  The wound was irrigated and closed with Monocryl and nylon.  Attention was then turned to the lateral aspect of the hindfoot. Oblique incision was made over the lateral calcaneus.  Sharp dissection was carried down through skin and subcutaneous tissue.  Periosteum was incised.  An osteotomy was then made across the calcaneal tuberosity. The calcaneus was translated medially approximately 1 cm.  Two 6.5-mm cannulated screws were inserted across the ostomy site and noted to have excellent compression.  A lateral and Harris heel views were obtained showing appropriate position of both screws.  Both wounds were then irrigated and closed with nylon suture.  Attention was then turned to the medial aspect of the hindfoot where a curvilinear incision was made along the course of the posterior tibial tendons.  The posterior tibial tendon was identified within its sheath.  It was noted to have some longitudinal split tears as well as tenosynovitis.  This was all sharply debrided with a #15 blade proximally and distally.  The insertion on the navicula was identified. The tendon sheath was incised in the depth of the wound exposing the flexor digitorum longus tendon.  It was exposed distally and transected under direct vision.  A whipstitch was inserted.  A K-wire was then inserted in the navicular tuberosity.  AP and lateral radiographs confirmed  appropriate position of the K-wire.  A 5-mm hole was then drilled in the navicular tuberosity.  The flexor digitorum longus tendon was pulled up into the hole from plantar to dorsal.  It was secured with a 5-mm tenodesis screw from [employer].  It was noted to have excellent purchase.  The free ends of suture were then tied to the periosteum.  The flexor digitorum longus and posterior tibial tendons were then sutured to each other using 0 Vicryl.  Wound was irrigated copiously.  The forefoot balance was then assessed and was noted to be appropriate.  The wound was irrigated and the retinaculum and flexor tendon sheath was closed with simple sutures of 0 Vicryl. Subcutaneous tissue was approximated with inverted simple sutures of 3-0 Monocryl and a running 3-0 nylon was used to close the skin incision. Sterile dressings were applied followed by a well-padded short-leg splint.  Tourniquet was released at 1 hour and 15 minutes.  The patient was awakened by Anesthesia and transported to the recovery room in stable condition.  FOLLOWUP PLAN:  The patient will be nonweightbearing on the left lower extremity.  She will follow up with me in 2 weeks for suture removal. She will take aspirin daily for DVT prophylaxis.  She has again been counseled to quit smoking.  RADIOGRAPHS:  AP, lateral, and Harris heel radiographs were obtained of the left foot.  These show interval correction of the flatfoot deformity with a calcaneal osteotomy and screw within the navicular tuberosity. The talar head is well covered.  No acute injuries noted.     Wylene Simmer, MD     JH/MEDQ  D:  10/28/2013  T:  10/29/2013  Job:  [id number]

## 2014-03-08 ENCOUNTER — Encounter: Payer: PRIVATE HEALTH INSURANCE | Attending: Internal Medicine

## 2014-03-08 VITALS — Ht 62.0 in | Wt 188.8 lb

## 2014-03-08 DIAGNOSIS — Z713 Dietary counseling and surveillance: Secondary | ICD-10-CM | POA: Insufficient documentation

## 2014-03-08 DIAGNOSIS — E119 Type 2 diabetes mellitus without complications: Secondary | ICD-10-CM | POA: Diagnosis not present

## 2014-03-08 NOTE — Progress Notes (Signed)
Patient was seen on 03/08/14 for the first of a series of three diabetes self-management courses at the [facility].  Patient Education Plan per assessed needs and concerns is to attend four course education program for Diabetes Self Management Education.  Current HbA1c: 6.3%  The following learning objectives were met by the patient during this class:  Describe diabetes  State some common risk factors for diabetes  Defines the role of glucose and insulin  Identifies type of diabetes and pathophysiology  Describe the relationship between diabetes and cardiovascular risk  State the members of the Healthcare Team  States the rationale for glucose monitoring  State when to test glucose  State their individual Target Range  State the importance of logging glucose readings  Describe how to interpret glucose readings  Identifies A1C target  Explain the correlation between A1c and eAG values  State symptoms and treatment of high blood glucose  State symptoms and treatment of low blood glucose  Explain proper technique for glucose testing  Identifies proper sharps disposal  Handouts given during class include:  Living Well with Diabetes book  Carb Counting and Meal Planning book  Meal Plan Card  Carbohydrate guide  Meal planning worksheet  Low Sodium Flavoring Tips  The diabetes portion plate  T5H to eAG Conversion Chart  Diabetes Medications  Diabetes Recommended Care Schedule  Support Group  Diabetes Success Plan  Core Class Satisfaction Survey  Follow-Up Plan:  Attend core 2

## 2014-03-15 DIAGNOSIS — E119 Type 2 diabetes mellitus without complications: Secondary | ICD-10-CM

## 2014-03-15 NOTE — Progress Notes (Signed)
Patient was seen on 03/15/14 for the second of a series of three diabetes self-management courses at the [facility]. The following learning objectives were met by the patient during this class:   Describe the role of different macronutrients on glucose  Explain how carbohydrates affect blood glucose  State what foods contain the most carbohydrates  Demonstrate carbohydrate counting  Demonstrate how to read Nutrition Facts food label  Describe effects of various fats on heart health  Describe the importance of good nutrition for health and healthy eating strategies  Describe techniques for managing your shopping, cooking and meal planning  List strategies to follow meal plan when dining out  Describe the effects of alcohol on glucose and how to use it safely  Goals:  Follow Diabetes Meal Plan as instructed  Eat 3 meals and 2 snacks, every 3-5 hrs  Limit carbohydrate intake to 45 grams carbohydrate/meal Limit carbohydrate intake to 15 grams carbohydrate/snack Add lean protein foods to meals/snacks  Monitor glucose levels as instructed by your doctor   Follow-Up Plan:  Attend Core 3  Work towards following your personal food plan.      Meter given:  One Touch Ultra Mini Self Monitoring Kit Lot #: [id number] X Exp: 12/2014

## 2014-03-22 DIAGNOSIS — E119 Type 2 diabetes mellitus without complications: Secondary | ICD-10-CM

## 2014-03-22 NOTE — Progress Notes (Signed)
Patient was seen on 03/22/14 for the third of a series of three diabetes self-management courses at the [facility]. The following learning objectives were met by the patient during this class:    State the amount of activity recommended for healthy living   Describe activities suitable for individual needs   Identify ways to regularly incorporate activity into daily life   Identify barriers to activity and ways to over come these barriers  Identify diabetes medications being personally used and their primary action for lowering glucose and possible side effects   Describe role of stress on blood glucose and develop strategies to address psychosocial issues   Identify diabetes complications and ways to prevent them  Explain how to manage diabetes during illness   Evaluate success in meeting personal goal   Establish 2-3 goals that they will plan to diligently work on until they return for the  54-monthfollow-up visit  Goals:  Follow Diabetes Meal Plan as instructed  Aim for 15-30 mins of physical activity daily as tolerated  Bring food record and glucose log to your follow up visit  Your patient has established the following 4 month goals in their individualized success plan: I will count my carb choices at most meals and snacks I will increase my activity level  I will test my glucose   Your patient has identified these potential barriers to change:  None stated  Your patient has identified their diabetes self-care support plan as  [facility]  Family support  Plan:  Attend Core 4 in 4 months

## 2014-07-11 ENCOUNTER — Encounter: Payer: PRIVATE HEALTH INSURANCE | Attending: Internal Medicine

## 2014-07-11 DIAGNOSIS — Z713 Dietary counseling and surveillance: Secondary | ICD-10-CM | POA: Insufficient documentation

## 2014-07-11 DIAGNOSIS — E119 Type 2 diabetes mellitus without complications: Secondary | ICD-10-CM | POA: Insufficient documentation

## 2014-07-11 NOTE — Progress Notes (Signed)
Appt start time: 0900 end time:  1000.  Patient was seen on 07/11/2014 for a review of the series of three diabetes self-management courses at the [facility]. The following learning objectives were met by the patient during this class:  . Reviewed blood glucose monitoring and interpretation including the recommended target ranges and Hgb A1c.  . Reviewed on carb counting, importance of regularly scheduled meals/snacks, and meal planning.  . Reviewed the effects of physical activity on glucose levels and long-term glucose control.  Recommended goal of 150 minutes of physical activity/week. . Reviewed patient medications and discussed role of medication on blood glucose and possible side effects. . Discussed strategies to manage stress, psychosocial issues, and other obstacles to diabetes management. . Encouraged moderate weight reduction to improve glucose levels.   . Reviewed short-term complications: hyper- and hypo-glycemia.  Discussed causes, symptoms, and treatment options. . Reviewed prevention, detection, and treatment of long-term complications.  Discussed the role of prolonged elevated glucose levels on body systems.  Goals:  Follow Diabetes Meal Plan as instructed  Eat 3 meals and 2 snacks, every 3-5 hrs  Limit carbohydrate intake to 45 grams carbohydrate/meal Limit carbohydrate intake to 15 grams carbohydrate/snack Add lean protein foods to meals/snacks  Monitor glucose levels as instructed by your doctor  Aim for goal of 15-30 mins of physical activity daily as tolerated  Bring food record and glucose log to your next nutrition visit   

## 2014-09-12 DIAGNOSIS — I10 Essential (primary) hypertension: Secondary | ICD-10-CM | POA: Diagnosis not present

## 2014-09-12 DIAGNOSIS — K219 Gastro-esophageal reflux disease without esophagitis: Secondary | ICD-10-CM | POA: Diagnosis not present

## 2014-09-12 DIAGNOSIS — J309 Allergic rhinitis, unspecified: Secondary | ICD-10-CM | POA: Diagnosis not present

## 2014-09-12 DIAGNOSIS — F17210 Nicotine dependence, cigarettes, uncomplicated: Secondary | ICD-10-CM | POA: Diagnosis not present

## 2014-09-12 DIAGNOSIS — M79671 Pain in right foot: Secondary | ICD-10-CM | POA: Diagnosis not present

## 2014-09-12 DIAGNOSIS — J4520 Mild intermittent asthma, uncomplicated: Secondary | ICD-10-CM | POA: Diagnosis not present

## 2014-09-12 DIAGNOSIS — E559 Vitamin D deficiency, unspecified: Secondary | ICD-10-CM | POA: Diagnosis not present

## 2014-09-12 DIAGNOSIS — E039 Hypothyroidism, unspecified: Secondary | ICD-10-CM | POA: Diagnosis not present

## 2014-09-28 DIAGNOSIS — Z720 Tobacco use: Secondary | ICD-10-CM | POA: Diagnosis not present

## 2014-09-28 DIAGNOSIS — J069 Acute upper respiratory infection, unspecified: Secondary | ICD-10-CM | POA: Diagnosis not present

## 2014-09-28 DIAGNOSIS — Z716 Tobacco abuse counseling: Secondary | ICD-10-CM | POA: Diagnosis not present

## 2014-11-15 DIAGNOSIS — K219 Gastro-esophageal reflux disease without esophagitis: Secondary | ICD-10-CM | POA: Diagnosis not present

## 2014-11-15 DIAGNOSIS — M79671 Pain in right foot: Secondary | ICD-10-CM | POA: Diagnosis not present

## 2014-11-15 DIAGNOSIS — E119 Type 2 diabetes mellitus without complications: Secondary | ICD-10-CM | POA: Diagnosis not present

## 2014-12-06 DIAGNOSIS — M6701 Short Achilles tendon (acquired), right ankle: Secondary | ICD-10-CM | POA: Diagnosis not present

## 2014-12-06 DIAGNOSIS — M76829 Posterior tibial tendinitis, unspecified leg: Secondary | ICD-10-CM | POA: Diagnosis not present

## 2014-12-19 DIAGNOSIS — M76821 Posterior tibial tendinitis, right leg: Secondary | ICD-10-CM | POA: Diagnosis not present

## 2014-12-26 DIAGNOSIS — M76821 Posterior tibial tendinitis, right leg: Secondary | ICD-10-CM | POA: Diagnosis not present

## 2015-01-03 DIAGNOSIS — Z803 Family history of malignant neoplasm of breast: Secondary | ICD-10-CM | POA: Diagnosis not present

## 2015-01-03 DIAGNOSIS — M76821 Posterior tibial tendinitis, right leg: Secondary | ICD-10-CM | POA: Diagnosis not present

## 2015-01-03 DIAGNOSIS — Z1231 Encounter for screening mammogram for malignant neoplasm of breast: Secondary | ICD-10-CM | POA: Diagnosis not present

## 2015-01-05 DIAGNOSIS — M76821 Posterior tibial tendinitis, right leg: Secondary | ICD-10-CM | POA: Diagnosis not present

## 2015-01-09 DIAGNOSIS — M76821 Posterior tibial tendinitis, right leg: Secondary | ICD-10-CM | POA: Diagnosis not present

## 2015-01-10 DIAGNOSIS — R928 Other abnormal and inconclusive findings on diagnostic imaging of breast: Secondary | ICD-10-CM | POA: Diagnosis not present

## 2015-01-10 DIAGNOSIS — Z803 Family history of malignant neoplasm of breast: Secondary | ICD-10-CM | POA: Diagnosis not present

## 2015-01-10 DIAGNOSIS — N63 Unspecified lump in breast: Secondary | ICD-10-CM | POA: Diagnosis not present

## 2015-01-10 DIAGNOSIS — R922 Inconclusive mammogram: Secondary | ICD-10-CM | POA: Diagnosis not present

## 2015-03-06 DIAGNOSIS — T148 Other injury of unspecified body region: Secondary | ICD-10-CM | POA: Diagnosis not present

## 2015-03-13 DIAGNOSIS — H5210 Myopia, unspecified eye: Secondary | ICD-10-CM | POA: Diagnosis not present

## 2015-03-13 DIAGNOSIS — H524 Presbyopia: Secondary | ICD-10-CM | POA: Diagnosis not present

## 2015-04-21 ENCOUNTER — Emergency Department (INDEPENDENT_AMBULATORY_CARE_PROVIDER_SITE_OTHER)
Admission: EM | Admit: 2015-04-21 | Discharge: 2015-04-21 | Disposition: A | Discharge status: Home / Self Care | Payer: PRIVATE HEALTH INSURANCE | Source: Home / Self Care | Attending: Family Medicine | Admitting: Family Medicine

## 2015-04-21 ENCOUNTER — Encounter (HOSPITAL_COMMUNITY): Payer: Self-pay | Admitting: Family Medicine

## 2015-04-21 DIAGNOSIS — M791 Myalgia: Secondary | ICD-10-CM | POA: Diagnosis not present

## 2015-04-21 DIAGNOSIS — M7918 Myalgia, other site: Secondary | ICD-10-CM

## 2015-04-21 MED ORDER — METHOCARBAMOL 500 MG PO TABS
500.0000 mg | ORAL_TABLET | Freq: Four times a day (QID) | ORAL | Status: DC | PRN
Start: 2015-04-21 — End: 2021-08-31

## 2015-04-21 MED ORDER — METHOCARBAMOL 500 MG PO TABS: 500.0000 mg | ORAL_TABLET | Freq: Four times a day (QID) | ORAL | Status: DC | PRN

## 2015-04-21 MED ORDER — MELOXICAM 15 MG PO TABS: 7.5000 mg | ORAL_TABLET | Freq: Every day | ORAL | Status: DC

## 2015-04-21 NOTE — ED Notes (Signed)
See physicians note Pt ambulated well to exam room.. NAD Alert and oriented x4... No acute distress.

## 2015-04-21 NOTE — Discharge Instructions (Signed)
You have suffered a very mild muscle strain to your left upper chest. This will improve over the next several days. Please members stay active. Please take your tramadol as prescribed. Please also start taking the meloxicam and Robaxin for additional pain and spasm relief. Please also apply heating pad and gentle massage tonight to the affected area.
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
## 2015-04-27 DIAGNOSIS — N390 Urinary tract infection, site not specified: Secondary | ICD-10-CM | POA: Diagnosis not present

## 2015-04-27 DIAGNOSIS — R300 Dysuria: Secondary | ICD-10-CM | POA: Diagnosis not present

## 2015-05-01 DIAGNOSIS — R312 Other microscopic hematuria: Secondary | ICD-10-CM | POA: Diagnosis not present

## 2015-05-15 DIAGNOSIS — I10 Essential (primary) hypertension: Secondary | ICD-10-CM | POA: Diagnosis not present

## 2015-05-15 DIAGNOSIS — K580 Irritable bowel syndrome with diarrhea: Secondary | ICD-10-CM | POA: Diagnosis not present

## 2015-05-15 DIAGNOSIS — M179 Osteoarthritis of knee, unspecified: Secondary | ICD-10-CM | POA: Diagnosis not present

## 2015-05-15 DIAGNOSIS — K219 Gastro-esophageal reflux disease without esophagitis: Secondary | ICD-10-CM | POA: Diagnosis not present

## 2015-05-15 DIAGNOSIS — Z0001 Encounter for general adult medical examination with abnormal findings: Secondary | ICD-10-CM | POA: Diagnosis not present

## 2015-05-15 DIAGNOSIS — Z23 Encounter for immunization: Secondary | ICD-10-CM | POA: Diagnosis not present

## 2015-05-15 DIAGNOSIS — E119 Type 2 diabetes mellitus without complications: Secondary | ICD-10-CM | POA: Diagnosis not present

## 2015-05-15 DIAGNOSIS — E039 Hypothyroidism, unspecified: Secondary | ICD-10-CM | POA: Diagnosis not present

## 2015-05-29 ENCOUNTER — Other Ambulatory Visit: Payer: Self-pay | Admitting: Internal Medicine

## 2015-08-29 DIAGNOSIS — N63 Unspecified lump in breast: Secondary | ICD-10-CM | POA: Diagnosis not present

## 2015-09-19 DIAGNOSIS — J441 Chronic obstructive pulmonary disease with (acute) exacerbation: Secondary | ICD-10-CM | POA: Diagnosis not present

## 2015-09-19 DIAGNOSIS — R05 Cough: Secondary | ICD-10-CM | POA: Diagnosis not present

## 2015-11-02 DIAGNOSIS — R062 Wheezing: Secondary | ICD-10-CM | POA: Diagnosis not present

## 2016-02-13 ENCOUNTER — Other Ambulatory Visit: Payer: Self-pay | Admitting: Internal Medicine

## 2016-02-13 ENCOUNTER — Ambulatory Visit
Admission: RE | Admit: 2016-02-13 | Discharge: 2016-02-13 | Disposition: A | Discharge status: Home / Self Care | Payer: Commercial Managed Care - HMO | Source: Ambulatory Visit | Attending: Internal Medicine | Admitting: Internal Medicine

## 2016-02-13 DIAGNOSIS — M5136 Other intervertebral disc degeneration, lumbar region: Secondary | ICD-10-CM | POA: Diagnosis not present

## 2016-02-13 DIAGNOSIS — M549 Dorsalgia, unspecified: Secondary | ICD-10-CM | POA: Diagnosis not present

## 2016-02-28 DIAGNOSIS — E119 Type 2 diabetes mellitus without complications: Secondary | ICD-10-CM | POA: Diagnosis not present

## 2016-02-28 DIAGNOSIS — M179 Osteoarthritis of knee, unspecified: Secondary | ICD-10-CM | POA: Diagnosis not present

## 2016-02-28 DIAGNOSIS — K580 Irritable bowel syndrome with diarrhea: Secondary | ICD-10-CM | POA: Diagnosis not present

## 2016-02-28 DIAGNOSIS — E139 Other specified diabetes mellitus without complications: Secondary | ICD-10-CM | POA: Diagnosis not present

## 2016-02-28 DIAGNOSIS — K219 Gastro-esophageal reflux disease without esophagitis: Secondary | ICD-10-CM | POA: Diagnosis not present

## 2016-02-28 DIAGNOSIS — E559 Vitamin D deficiency, unspecified: Secondary | ICD-10-CM | POA: Diagnosis not present

## 2016-02-28 DIAGNOSIS — F17210 Nicotine dependence, cigarettes, uncomplicated: Secondary | ICD-10-CM | POA: Diagnosis not present

## 2016-02-28 DIAGNOSIS — J449 Chronic obstructive pulmonary disease, unspecified: Secondary | ICD-10-CM | POA: Diagnosis not present

## 2016-02-28 DIAGNOSIS — I10 Essential (primary) hypertension: Secondary | ICD-10-CM | POA: Diagnosis not present

## 2016-04-17 DIAGNOSIS — Z1231 Encounter for screening mammogram for malignant neoplasm of breast: Secondary | ICD-10-CM | POA: Diagnosis not present

## 2016-06-06 DIAGNOSIS — H524 Presbyopia: Secondary | ICD-10-CM | POA: Diagnosis not present

## 2016-06-11 ENCOUNTER — Ambulatory Visit (HOSPITAL_COMMUNITY)
Admission: EM | Admit: 2016-06-11 | Discharge: 2016-06-11 | Disposition: A | Discharge status: Home / Self Care | Payer: Commercial Managed Care - HMO | Attending: Family Medicine | Admitting: Family Medicine

## 2016-06-11 ENCOUNTER — Encounter (HOSPITAL_COMMUNITY): Payer: Self-pay | Admitting: *Deleted

## 2016-06-11 DIAGNOSIS — Z79899 Other long term (current) drug therapy: Secondary | ICD-10-CM | POA: Diagnosis not present

## 2016-06-11 DIAGNOSIS — K580 Irritable bowel syndrome with diarrhea: Secondary | ICD-10-CM | POA: Diagnosis not present

## 2016-06-11 DIAGNOSIS — M179 Osteoarthritis of knee, unspecified: Secondary | ICD-10-CM | POA: Diagnosis not present

## 2016-06-11 DIAGNOSIS — Z716 Tobacco abuse counseling: Secondary | ICD-10-CM | POA: Diagnosis not present

## 2016-06-11 DIAGNOSIS — Z7189 Other specified counseling: Secondary | ICD-10-CM | POA: Diagnosis not present

## 2016-06-11 DIAGNOSIS — Z23 Encounter for immunization: Secondary | ICD-10-CM | POA: Diagnosis not present

## 2016-06-11 DIAGNOSIS — H9202 Otalgia, left ear: Secondary | ICD-10-CM | POA: Diagnosis not present

## 2016-06-11 DIAGNOSIS — T700XXA Otitic barotrauma, initial encounter: Secondary | ICD-10-CM | POA: Diagnosis not present

## 2016-06-11 DIAGNOSIS — Z1389 Encounter for screening for other disorder: Secondary | ICD-10-CM | POA: Diagnosis not present

## 2016-06-11 DIAGNOSIS — E139 Other specified diabetes mellitus without complications: Secondary | ICD-10-CM | POA: Diagnosis not present

## 2016-06-11 DIAGNOSIS — I10 Essential (primary) hypertension: Secondary | ICD-10-CM | POA: Diagnosis not present

## 2016-06-11 DIAGNOSIS — K219 Gastro-esophageal reflux disease without esophagitis: Secondary | ICD-10-CM | POA: Diagnosis not present

## 2016-06-11 DIAGNOSIS — F17210 Nicotine dependence, cigarettes, uncomplicated: Secondary | ICD-10-CM | POA: Diagnosis not present

## 2016-06-11 DIAGNOSIS — E559 Vitamin D deficiency, unspecified: Secondary | ICD-10-CM | POA: Diagnosis not present

## 2016-06-11 DIAGNOSIS — E039 Hypothyroidism, unspecified: Secondary | ICD-10-CM | POA: Diagnosis not present

## 2016-06-11 DIAGNOSIS — E119 Type 2 diabetes mellitus without complications: Secondary | ICD-10-CM | POA: Diagnosis not present

## 2016-06-11 DIAGNOSIS — E049 Nontoxic goiter, unspecified: Secondary | ICD-10-CM | POA: Diagnosis not present

## 2016-06-11 DIAGNOSIS — Z0000 Encounter for general adult medical examination without abnormal findings: Secondary | ICD-10-CM | POA: Diagnosis not present

## 2016-06-11 NOTE — ED Provider Notes (Signed)
CSN: [id number]     Arrival date & time 06/11/16  1835 History   First MD Initiated Contact with Patient 06/11/16 2013     Chief Complaint  Patient presents with  . Ear Fullness   (Consider location/radiation/quality/duration/timing/severity/associated sxs/prior Treatment) 65 year old female went to see her PCP today for physical and by him that she had wax in the ears. Both ears were irrigated. Afterwards she noticed that the left ear felt stopped up as if it needed to pop. That is her complaint. Denies pain.      Past Medical History:  Diagnosis Date  . Arthritis   . Asthma   . Diabetes mellitus without complication ([place])   . GERD (gastroesophageal reflux disease)   . Hyperlipidemia   . Hypertension   . Hypothyroidism   . Pancreatitis    history   Past Surgical History:  Procedure Laterality Date  . ABDOMINAL HYSTERECTOMY    . BACK SURGERY  2011   lumbar fusion  . CALCANEAL OSTEOTOMY Left 10/28/2013   Procedure: LEFT CALCANEAL OSTEOTOMY;  Surgeon: Wylene Simmer, MD;  Location: [facility];  Service: Orthopedics;  Laterality: Left;  . CHOLECYSTECTOMY  2009  . GASTROC RECESSION EXTREMITY Left 10/28/2013   Procedure: LEFT GASTROCNEMIUS RECESSION ;  Surgeon: Wylene Simmer, MD;  Location: [facility];  Service: Orthopedics;  Laterality: Left;  . JOINT REPLACEMENT     bilat total knees  . KNEE SURGERY     right  . rotator cuff     rt  . TENDON TRANSFER Left 10/28/2013   Procedure: LEFT FLEXOR DIGITORUM LONGUS TRANSFER, POSTERIOR TIBIAL TENOLYSIS;  Surgeon: Wylene Simmer, MD;  Location: [facility];  Service: Orthopedics;  Laterality: Left;  . THYROIDECTOMY     Family History  Problem Relation Age of Onset  . Asthma Mother   . Cancer Father   . Sleep apnea Other   . Diabetes Other   . Hypertension Other    Social History  Substance Use Topics  . Smoking status: Current Every Day Smoker    Packs/day: 1.00    Types: Cigarettes  .  Smokeless tobacco: Not on file  . Alcohol use No     Comment: occasional   OB History    No data available     Review of Systems  Constitutional: Negative.   HENT: Negative for congestion, ear discharge, facial swelling, rhinorrhea and sinus pressure.        As per history of present illness  Eyes: Negative.   Gastrointestinal: Negative.   Neurological: Negative.   All other systems reviewed and are negative.   Allergies  Maxidex [dexamethasone] and Hctz [hydrochlorothiazide]  Home Medications   Prior to Admission medications   Medication Sig Start Date End Date Taking? Authorizing Provider  albuterol (PROVENTIL HFA;VENTOLIN HFA) 108 (90 BASE) MCG/ACT inhaler Inhale 2 puffs into the lungs 4 (four) times daily. 07/15/11 07/14/12  Nishant Dhungel, MD  calcium citrate-vitamin D (CITRACAL+D) 315-200 MG-UNIT per tablet Take 1 tablet by mouth 2 (two) times daily.    Historical Provider, MD  diclofenac sodium (VOLTAREN) 1 % GEL Apply 1 application topically 3 (three) times daily as needed. For pain     Historical Provider, MD  Esomeprazole Magnesium (NEXIUM PO) Take 40 mg by mouth daily.     Historical Provider, MD  Levothyroxine Sodium (LEVOXYL PO) Take 75 mcg by mouth daily.     Historical Provider, MD  meloxicam (MOBIC) 15 MG tablet Take 0.5-1 tablets (7.5-15  mg total) by mouth daily. 04/21/15   Waldemar Dickens, MD  methocarbamol (ROBAXIN) 500 MG tablet Take 1-2 tablets (500-1,000 mg total) by mouth every 6 (six) hours as needed for muscle spasms. 04/21/15   Waldemar Dickens, MD  Montelukast Sodium (SINGULAIR PO) Take 10 mg by mouth daily.     Historical Provider, MD  spironolactone (ALDACTONE) 25 MG tablet Take 25 mg by mouth 2 (two) times daily.      Historical Provider, MD   Meds Ordered and Administered this Visit  Medications - No data to display  BP 150/82 (BP Location: Right Arm)   Pulse 82   Temp 98.6 F (37 C) (Oral)   Resp 16   SpO2 100%  No data found.   Physical  Exam  Constitutional: She appears well-developed and well-nourished. No distress.  HENT:  Head: Normocephalic and atraumatic.  Right Ear: External ear normal.  Left Ear: External ear normal.  Nose: Nose normal.  Right TM appears normal. Left TM mildly retracted, no erythema or effusion. The EAC is clear.  Eyes: EOM are normal.  Neck: Neck supple.  Cardiovascular: Normal rate.   Pulmonary/Chest: Effort normal.  Skin: Skin is warm and dry.  Nursing note and vitals reviewed.   Urgent Care Course   Clinical Course     Procedures (including critical care time)  Labs Review Labs Reviewed - No data to display  Imaging Review No results found.   Visual Acuity Review  Right Eye Distance:   Left Eye Distance:   Bilateral Distance:    Right Eye Near:   Left Eye Near:    Bilateral Near:         MDM   1. Barotitis media, initial encounter   2. Otalgia of left ear    Your left ear does not have anymore wax or other foreign bodies in it. No signs of infection, no water in the ear. No fluid behind the ear. No redness or evidence of infection. The eardrum is a little retracted likely caused by eustachian tube swelling or blockage. This should open up in the next day or 2. He may consider taking 10 mg of Sudafed PE every 6 hours as needed for congestion. This sometimes helps to open the area up sooner. Do not put anything else in the ear. If you are not improving in the next 3-4 days follow-up with your primary care doctor.     Janne Napoleon, NP 06/11/16 2026

## 2016-06-11 NOTE — Discharge Instructions (Signed)
Your left ear does not have anymore wax or other foreign bodies in it. No signs of infection, no water in the ear. No fluid behind the ear. No redness or evidence of infection. The eardrum is a little retracted likely caused by eustachian tube swelling or blockage. This should open up in the next day or 2. He may consider taking 10 mg of Sudafed PE every 6 hours as needed for congestion. This sometimes helps to open the area up sooner. Do not put anything else in the ear. If you are not improving in the next 3-4 days follow-up with your primary care doctor.

## 2016-06-11 NOTE — ED Triage Notes (Signed)
Pt  States  She   Had  Physical  Exam today  With  Her  PCP  - She  Reports      The  Doctor  Washed  Her  Ears  Out  But  She  States  The  Left  Ear  Seems    Muffled      She  Denies  Any  Other  Symptoms

## 2016-06-18 ENCOUNTER — Emergency Department (HOSPITAL_COMMUNITY)
Admission: EM | Admit: 2016-06-18 | Discharge: 2016-06-18 | Disposition: A | Discharge status: Home / Self Care | Payer: Commercial Managed Care - HMO | Attending: Emergency Medicine | Admitting: Emergency Medicine

## 2016-06-18 ENCOUNTER — Encounter (HOSPITAL_COMMUNITY): Payer: Self-pay | Admitting: Emergency Medicine

## 2016-06-18 DIAGNOSIS — Z96653 Presence of artificial knee joint, bilateral: Secondary | ICD-10-CM | POA: Insufficient documentation

## 2016-06-18 DIAGNOSIS — E119 Type 2 diabetes mellitus without complications: Secondary | ICD-10-CM | POA: Insufficient documentation

## 2016-06-18 DIAGNOSIS — J449 Chronic obstructive pulmonary disease, unspecified: Secondary | ICD-10-CM | POA: Diagnosis not present

## 2016-06-18 DIAGNOSIS — E039 Hypothyroidism, unspecified: Secondary | ICD-10-CM | POA: Insufficient documentation

## 2016-06-18 DIAGNOSIS — L02611 Cutaneous abscess of right foot: Secondary | ICD-10-CM | POA: Insufficient documentation

## 2016-06-18 DIAGNOSIS — J45909 Unspecified asthma, uncomplicated: Secondary | ICD-10-CM | POA: Insufficient documentation

## 2016-06-18 DIAGNOSIS — F17210 Nicotine dependence, cigarettes, uncomplicated: Secondary | ICD-10-CM | POA: Diagnosis not present

## 2016-06-18 DIAGNOSIS — I10 Essential (primary) hypertension: Secondary | ICD-10-CM | POA: Diagnosis not present

## 2016-06-18 DIAGNOSIS — L089 Local infection of the skin and subcutaneous tissue, unspecified: Secondary | ICD-10-CM

## 2016-06-18 DIAGNOSIS — M79674 Pain in right toe(s): Secondary | ICD-10-CM | POA: Diagnosis present

## 2016-06-18 NOTE — ED Notes (Signed)
Not a blood blister on top of the nail.  Patient has the raised area (which is actually her nail) painted a darker color.   The possible abscess is under the nail.

## 2016-06-18 NOTE — ED Triage Notes (Signed)
Patient states having toe pain/swelling to R great toe.   Looks like possible blood blister above the nail bed.   Patient states she has had to have the area lanced previously and it has come back.  Denies other problems.

## 2016-06-18 NOTE — Discharge Instructions (Signed)
Follow-up: Please follow-up with Dr. Amalia Hailey at [facility] tomorrow at her scheduled appointment of 1:45 PM. Please return to emergency department if you develop any new or worsening symptoms.

## 2016-06-18 NOTE — ED Provider Notes (Signed)
[facility] DEPT Provider Note   CSN: [id number] Arrival date & time: 06/18/16  [id number]     History   Chief Complaint Chief Complaint  Patient presents with  . Toe Pain    HPI Tina Neal is a 65 y.o. female patient presents with a one-week history of right great toe pain. Patient reports it has been painful to ambulate. She denies any injury to her toe. Patient reports she has had history of infection that has been drained in the past. Patient denies any fevers, or redness of the area. She also denies any chest pain, shortness of breath, abdominal pain, nausea, vomiting, urinary symptoms. Patient has not taken any medications at home. Patient has been seen in the past at [facility].  HPI  Past Medical History:  Diagnosis Date  . Arthritis   . Asthma   . Diabetes mellitus without complication ([place])   . GERD (gastroesophageal reflux disease)   . Hyperlipidemia   . Hypertension   . Hypothyroidism   . Pancreatitis    history    Patient Active Problem List   Diagnosis Date Noted  . Calcific tendinitis of left shoulder 06/22/2012  . Acromioclavicular arthrosis 06/22/2012  . Postlaminectomy syndrome, lumbar region 01/28/2012  . Disorders of sacrum 01/28/2012  . Other chronic postoperative pain 01/28/2012  . Pancreatitis 07/13/2011  . Hypertension 07/13/2011  . COPD (chronic obstructive pulmonary disease) ([place]) 07/13/2011    Past Surgical History:  Procedure Laterality Date  . ABDOMINAL HYSTERECTOMY    . BACK SURGERY  2011   lumbar fusion  . CALCANEAL OSTEOTOMY Left 10/28/2013   Procedure: LEFT CALCANEAL OSTEOTOMY;  Surgeon: Wylene Simmer, MD;  Location: [facility];  Service: Orthopedics;  Laterality: Left;  . CHOLECYSTECTOMY  2009  . GASTROC RECESSION EXTREMITY Left 10/28/2013   Procedure: LEFT GASTROCNEMIUS RECESSION ;  Surgeon: Wylene Simmer, MD;  Location: [facility];  Service: Orthopedics;  Laterality: Left;  . JOINT REPLACEMENT      bilat total knees  . KNEE SURGERY     right  . rotator cuff     rt  . TENDON TRANSFER Left 10/28/2013   Procedure: LEFT FLEXOR DIGITORUM LONGUS TRANSFER, POSTERIOR TIBIAL TENOLYSIS;  Surgeon: Wylene Simmer, MD;  Location: [place] [place];  Service: Orthopedics;  Laterality: Left;  . THYROIDECTOMY      OB History    No data available       Home Medications    Prior to Admission medications   Medication Sig Start Date End Date Taking? Authorizing Provider  albuterol (PROVENTIL HFA;VENTOLIN HFA) 108 (90 BASE) MCG/ACT inhaler Inhale 2 puffs into the lungs 4 (four) times daily. 07/15/11 07/14/12  Nishant Dhungel, MD  calcium citrate-vitamin D (CITRACAL+D) 315-200 MG-UNIT per tablet Take 1 tablet by mouth 2 (two) times daily.    Historical Provider, MD  diclofenac sodium (VOLTAREN) 1 % GEL Apply 1 application topically 3 (three) times daily as needed. For pain     Historical Provider, MD  Esomeprazole Magnesium (NEXIUM PO) Take 40 mg by mouth daily.     Historical Provider, MD  Levothyroxine Sodium (LEVOXYL PO) Take 75 mcg by mouth daily.     Historical Provider, MD  meloxicam (MOBIC) 15 MG tablet Take 0.5-1 tablets (7.5-15 mg total) by mouth daily. 04/21/15   Waldemar Dickens, MD  methocarbamol (ROBAXIN) 500 MG tablet Take 1-2 tablets (500-1,000 mg total) by mouth every 6 (six) hours as needed for muscle spasms. 04/21/15  Waldemar Dickens, MD  Montelukast Sodium (SINGULAIR PO) Take 10 mg by mouth daily.     Historical Provider, MD  spironolactone (ALDACTONE) 25 MG tablet Take 25 mg by mouth 2 (two) times daily.      Historical Provider, MD    Family History Family History  Problem Relation Age of Onset  . Asthma Mother   . Cancer Father   . Sleep apnea Other   . Diabetes Other   . Hypertension Other     Social History Social History  Substance Use Topics  . Smoking status: Current Every Day Smoker    Packs/day: 0.50    Types: Cigarettes  . Smokeless tobacco: Not on  file  . Alcohol use No     Comment: occasional     Allergies   Maxidex [dexamethasone] and Hctz [hydrochlorothiazide]   Review of Systems Review of Systems  Constitutional: Negative for chills and fever.  HENT: Negative for facial swelling and sore throat.   Respiratory: Negative for shortness of breath.   Cardiovascular: Negative for chest pain.  Gastrointestinal: Negative for abdominal pain, nausea and vomiting.  Genitourinary: Negative for dysuria.  Musculoskeletal: Negative for back pain.  Skin: Positive for wound (edema to R great toe). Negative for rash.  Neurological: Negative for headaches.  Psychiatric/Behavioral: The patient is not nervous/anxious.      Physical Exam Updated Vital Signs BP 125/87 (BP Location: Right Arm)   Pulse 70   Temp 98.1 F (36.7 C) (Oral)   Resp 18   SpO2 100%   Physical Exam  Constitutional: She appears well-developed and well-nourished. No distress.  HENT:  Head: Normocephalic and atraumatic.  Mouth/Throat: Oropharynx is clear and moist. No oropharyngeal exudate.  Eyes: Conjunctivae are normal. Pupils are equal, round, and reactive to light. Right eye exhibits no discharge. Left eye exhibits no discharge. No scleral icterus.  Neck: Normal range of motion. Neck supple. No thyromegaly present.  Cardiovascular: Normal rate, regular rhythm, normal heart sounds and intact distal pulses.  Exam reveals no gallop and no friction rub.   No murmur heard. Pulmonary/Chest: Effort normal and breath sounds normal. No stridor. No respiratory distress. She has no wheezes. She has no rales.  Abdominal: Soft. Bowel sounds are normal. She exhibits no distension. There is no tenderness. There is no rebound and no guarding.  Musculoskeletal: She exhibits no edema.  Lymphadenopathy:    She has no cervical adenopathy.  Neurological: She is alert. Coordination normal.  Skin: Skin is warm and dry. No rash noted. She is not diaphoretic. No pallor.  Abscess  noted on ultrasound to plantar, distal aspect of right great toe; tenderness only to this area, no bony tenderness, no erythema, mild edema  Psychiatric: She has a normal mood and affect.  Nursing note and vitals reviewed.    ED Treatments / Results  Labs (all labs ordered are listed, but only abnormal results are displayed) Labs Reviewed - No data to display  EKG  EKG Interpretation None       Radiology No results found.  Procedures Procedures (including critical care time)  EMERGENCY DEPARTMENT [place] ABD/AORTA EXAM EMERGENCY DEPARTMENT US SOFT TISSUE INTERPRETATION "Study: Limited Ultrasound of the noted body part in comments below"  INDICATIONS: Soft tissue infection Multiple views of the body part are obtained with a multi-frequency linear probe  PERFORMED BY:  Myself  IMAGES ARCHIVED?: Yes  SIDE:Right   BODY PART:Lower extremity  FINDINGS: Abcess present  LIMITATIONS: none  INTERPRETATION:  Abcess present  COMMENT:      Medications Ordered in ED Medications - No data to display   Initial Impression / Assessment and Plan / ED Course  I have reviewed the triage vital signs and the nursing notes.  Pertinent labs & imaging results that were available during my care of the patient were reviewed by me and considered in my medical decision making (see chart for details).  Clinical Course     Patient with abscess to right great toe. However, considering patient's history of diabetes and high risk of infection, I have arranged for patient to follow-up tomorrow at [facility] at 1:45 PM for further evaluation and treatment. Patient is aware of this appointment and that she needs to follow-up for further treatment. Patient given postop shoe for comfort Patient evaluated by Dr. Leonette Monarch who guided the patient's management and agrees with plan. Return precautions discussed. Patient understands and agrees with plan. Patient vitals stable throughout ED course and  discharged in satisfactory condition.  Final Clinical Impressions(s) / ED Diagnoses   Final diagnoses:  Toe infection    New Prescriptions Discharge Medication List as of 06/18/2016  9:18 AM       Frederica Kuster, PA-C 06/18/16 [place], MD 06/19/16 [phone]

## 2016-06-19 ENCOUNTER — Encounter: Payer: Self-pay | Admitting: Podiatry

## 2016-06-19 ENCOUNTER — Ambulatory Visit (INDEPENDENT_AMBULATORY_CARE_PROVIDER_SITE_OTHER): Payer: Commercial Managed Care - HMO | Admitting: Podiatry

## 2016-06-19 ENCOUNTER — Ambulatory Visit (INDEPENDENT_AMBULATORY_CARE_PROVIDER_SITE_OTHER): Payer: Commercial Managed Care - HMO

## 2016-06-19 VITALS — BP 136/79 | HR 87 | Resp 16

## 2016-06-19 DIAGNOSIS — D1739 Benign lipomatous neoplasm of skin and subcutaneous tissue of other sites: Secondary | ICD-10-CM | POA: Diagnosis not present

## 2016-06-19 DIAGNOSIS — R2241 Localized swelling, mass and lump, right lower limb: Secondary | ICD-10-CM | POA: Diagnosis not present

## 2016-06-19 DIAGNOSIS — L602 Onychogryphosis: Secondary | ICD-10-CM

## 2016-06-19 DIAGNOSIS — M67471 Ganglion, right ankle and foot: Secondary | ICD-10-CM | POA: Diagnosis not present

## 2016-06-19 DIAGNOSIS — M79674 Pain in right toe(s): Secondary | ICD-10-CM | POA: Diagnosis not present

## 2016-06-19 NOTE — Patient Instructions (Signed)

## 2016-06-19 NOTE — Progress Notes (Signed)
   Subjective:    Patient ID: Tina Neal, female    DOB: 1950/10/31, 66 y.o.   MRN: [id number]  HPI    Review of Systems  Musculoskeletal: Positive for gait problem.  All other systems reviewed and are negative.      Objective:   Physical Exam        Assessment & Plan:

## 2016-06-20 ENCOUNTER — Telehealth: Payer: Self-pay | Admitting: *Deleted

## 2016-06-20 DIAGNOSIS — M7989 Other specified soft tissue disorders: Secondary | ICD-10-CM

## 2016-06-20 NOTE — Telephone Encounter (Addendum)
Pt states she is waiting for an antibiotic to be sent to [facility] on [address]. Pt called again states she was to get an antibiotic and it has not been called in to the [facility].  06/21/2016-Mailed right plantar 1st toe skin and soft tissue specimen to [facility]. 08/25/2015-Left message informing pt that Dr. Amalia Hailey stated she did not necessarily need an antibiotic, but call in Bactrim DS #20 one bid.

## 2016-06-21 ENCOUNTER — Telehealth: Payer: Self-pay

## 2016-06-21 MED ORDER — SULFAMETHOXAZOLE-TRIMETHOPRIM 800-160 MG PO TABS: 1.0000 | ORAL_TABLET | Freq: Two times a day (BID) | ORAL | 0 refills | Status: DC

## 2016-06-21 NOTE — Telephone Encounter (Signed)
Spoke with pt regarding antibiotic therapy, advised her of Dr Rebekah Chesterfield orders for Bactrim BID x 10 days. Rx sent to her pharmacy, she is to call with any acute symptoms changes, or worsening of symptoms

## 2016-06-21 NOTE — Telephone Encounter (Signed)
She doesn't necessarily need antibiotics. But order her Bactrim DS #20 BID.

## 2016-07-01 ENCOUNTER — Ambulatory Visit (INDEPENDENT_AMBULATORY_CARE_PROVIDER_SITE_OTHER): Payer: Commercial Managed Care - HMO | Admitting: Podiatry

## 2016-07-01 ENCOUNTER — Encounter: Payer: Self-pay | Admitting: Podiatry

## 2016-07-01 DIAGNOSIS — M67471 Ganglion, right ankle and foot: Secondary | ICD-10-CM | POA: Diagnosis not present

## 2016-07-01 DIAGNOSIS — E0843 Diabetes mellitus due to underlying condition with diabetic autonomic (poly)neuropathy: Secondary | ICD-10-CM

## 2016-07-01 DIAGNOSIS — M79609 Pain in unspecified limb: Secondary | ICD-10-CM | POA: Diagnosis not present

## 2016-07-01 DIAGNOSIS — L603 Nail dystrophy: Secondary | ICD-10-CM | POA: Diagnosis not present

## 2016-07-01 DIAGNOSIS — B351 Tinea unguium: Secondary | ICD-10-CM | POA: Diagnosis not present

## 2016-07-01 DIAGNOSIS — M79674 Pain in right toe(s): Secondary | ICD-10-CM

## 2016-07-01 DIAGNOSIS — L608 Other nail disorders: Secondary | ICD-10-CM

## 2016-07-13 NOTE — Progress Notes (Signed)
Patient ID: Army Chaco, female   DOB: 02-22-1951, 65 y.o.   MRN: [id number] Subjective:  Patient presents today as a new patient for evaluation of pain to the right great toe. Patient states that she has a painful, thickened, elongated toenail to the right great toe. Patient states that even if she trims her nail is painful and shoe gear because of the thickness. Patient also has a complaint of a bulbous mass to the right great toe. Patient states that she noticed the mass a couple of days ago and began to become sore. Patient presents today for further treatment and evaluation    Objective/Physical Exam General: The patient is alert and oriented x3 in no acute distress.  Dermatology: Hyperkeratotic, onychodystrophy of the right great toenails noted. Nail is thickened, and he elongated. Painful with direct palpation of the toenail.  Skin is warm, dry and supple bilateral lower extremities. Negative for open lesions or macerations.  Vascular: Palpable pedal pulses bilaterally. No edema or erythema noted. Capillary refill within normal limits.  Neurological: Epicritic and protective threshold grossly intact bilaterally.   Musculoskeletal Exam: Large fluctuant subcutaneous mass noted to the distal lateral aspect of the right great toe. Diameter of the mass appears to be a proximally 2 cm. This mass is painful on palpation. Range of motion within normal limits to all pedal and ankle joints bilateral. Muscle strength 5/5 in all groups bilateral.   Radiographic Exam:  Normal osseous mineralization. Joint spaces preserved. No fracture/dislocation/boney destruction.    Assessment: #1 possible ganglion cyst right great toe #2 fluctuant mass right great toe #3 onychogryposis right great toenail #4 pain in right great toe  Plan of Care:  #1 Patient was evaluated. Today we discussed the conservative versus procedural management of the right great toenail. Conservative management would consist of  shoe gear modification and regular trimming of the toenail. Patient opts for total permanent nail avulsion today. #2 total permanent nail avulsion was performed to the right great toenail. After local anesthesia infiltration of a 50-50 mixture of 2% lidocaine plain with 0.5% Marcaine plain in a hallux block fashion. The toe was prepped and aseptic technique and phenol 330 seconds was applied followed by alcohol flush. Light dressing applied. #3 incision and drainage was performed of the fluctuant bulbous mass to the distal aspect of the right great toe. The toe was prepped and aseptic technique an incision approximately 0.5 cm in length was performed using a #15 blade. Approximately 2 mL of gelatinous material was expressed from the right great toe consistent with a ganglion cyst. #4 gelatinous material was placed in a sterile specimen container and sent to pathology. #5 superficial skin edges of the incision were reapproximated using 4-0 nylon suture #6 dry sterile dressing applied #7 return to clinic in 1 week   Dr. Edrick Kins, [place]

## 2016-07-14 NOTE — Progress Notes (Signed)
SUBJECTIVE Patient with a history of diabetes mellitus presents to office today complaining of elongated, thickened nails. Pain while ambulating in shoes. Patient is unable to trim their own nails.  Patient also presents today for follow-up evaluation of a ganglion cyst to the right great toe. On last visit incision and drainage was performed. Patient presents today for follow-up evaluation and treatment  Allergies  Allergen Reactions  . Maxidex [Dexamethasone] Photosensitivity  . Hctz [Hydrochlorothiazide] Rash    OBJECTIVE General Patient is awake, alert, and oriented x 3 and in no acute distress. Derm Incision noted to the right great toe from incision and drainage on the last visit. Sutures are intact. Incision site is well coapted with sutures intact. No sign of infectious process  Skin is dry and supple bilateral. Negative open lesions or macerations. Remaining integument unremarkable. Nails are tender, long, thickened and dystrophic with subungual debris, consistent with onychomycosis, 1-5 bilateral. No signs of infection noted. Vasc  DP and PT pedal pulses palpable bilaterally. Temperature gradient within normal limits.  Neuro Epicritic and protective threshold sensation diminished bilaterally.  Musculoskeletal Exam there is a large palpable fluctuant mass to the right great toe. No symptomatic pedal deformities noted bilateral. Muscular strength within normal limits.  ASSESSMENT 1. Diabetes Mellitus w/ peripheral neuropathy 2. Onychomycosis of nail due to dermatophyte bilateral 3. Pain in foot bilateral 4. Ganglion cyst right great toe-recurrent  PLAN OF CARE 1. Patient evaluated today. 2. Instructed to maintain good pedal hygiene and foot care. Stressed importance of controlling blood sugar.  3. Mechanical debridement of nails 1-5 bilaterally performed using a nail nipper. Filed with dremel without incident.  4. Today sutures were removed from the incision and drainage site of  the right great toe. Antibiotic ointment and a Band-Aid applied. 5. Pathology was reviewed which was consistent with ganglion cyst. 6. Today we discussed conservative versus surgical management of ganglion cyst formation to the right great toe. Return to clinic in 6 weeks to discuss surgical excision of ganglion cyst right great toe if it continues to be symptomatic  Edrick Kins, DPM [facility]  Dr. Edrick Kins, [place] [place]                                        [place], [place] [address]                Office [phone]  Fax [phone]

## 2016-08-06 DIAGNOSIS — H2513 Age-related nuclear cataract, bilateral: Secondary | ICD-10-CM | POA: Diagnosis not present

## 2016-08-06 DIAGNOSIS — E119 Type 2 diabetes mellitus without complications: Secondary | ICD-10-CM | POA: Diagnosis not present

## 2016-08-12 ENCOUNTER — Ambulatory Visit: Payer: Commercial Managed Care - HMO | Admitting: Podiatry

## 2016-08-26 ENCOUNTER — Ambulatory Visit: Payer: Commercial Managed Care - HMO | Admitting: Podiatry

## 2016-09-19 DIAGNOSIS — Z471 Aftercare following joint replacement surgery: Secondary | ICD-10-CM | POA: Diagnosis not present

## 2016-09-19 DIAGNOSIS — Z96652 Presence of left artificial knee joint: Secondary | ICD-10-CM | POA: Diagnosis not present

## 2016-09-19 DIAGNOSIS — M7062 Trochanteric bursitis, left hip: Secondary | ICD-10-CM | POA: Diagnosis not present

## 2016-09-19 DIAGNOSIS — Z96651 Presence of right artificial knee joint: Secondary | ICD-10-CM | POA: Diagnosis not present

## 2016-09-19 DIAGNOSIS — Z96653 Presence of artificial knee joint, bilateral: Secondary | ICD-10-CM | POA: Diagnosis not present

## 2016-12-18 DIAGNOSIS — E119 Type 2 diabetes mellitus without complications: Secondary | ICD-10-CM | POA: Diagnosis not present

## 2016-12-18 DIAGNOSIS — K219 Gastro-esophageal reflux disease without esophagitis: Secondary | ICD-10-CM | POA: Diagnosis not present

## 2016-12-18 DIAGNOSIS — E049 Nontoxic goiter, unspecified: Secondary | ICD-10-CM | POA: Diagnosis not present

## 2016-12-18 DIAGNOSIS — M179 Osteoarthritis of knee, unspecified: Secondary | ICD-10-CM | POA: Diagnosis not present

## 2016-12-18 DIAGNOSIS — I10 Essential (primary) hypertension: Secondary | ICD-10-CM | POA: Diagnosis not present

## 2016-12-18 DIAGNOSIS — M545 Low back pain: Secondary | ICD-10-CM | POA: Diagnosis not present

## 2016-12-18 DIAGNOSIS — E039 Hypothyroidism, unspecified: Secondary | ICD-10-CM | POA: Diagnosis not present

## 2016-12-18 DIAGNOSIS — Z79899 Other long term (current) drug therapy: Secondary | ICD-10-CM | POA: Diagnosis not present

## 2016-12-18 DIAGNOSIS — K580 Irritable bowel syndrome with diarrhea: Secondary | ICD-10-CM | POA: Diagnosis not present

## 2016-12-27 DIAGNOSIS — K115 Sialolithiasis: Secondary | ICD-10-CM | POA: Diagnosis not present

## 2017-01-15 DIAGNOSIS — K219 Gastro-esophageal reflux disease without esophagitis: Secondary | ICD-10-CM | POA: Diagnosis not present

## 2017-01-15 DIAGNOSIS — I10 Essential (primary) hypertension: Secondary | ICD-10-CM | POA: Diagnosis not present

## 2017-01-15 DIAGNOSIS — F17200 Nicotine dependence, unspecified, uncomplicated: Secondary | ICD-10-CM | POA: Diagnosis not present

## 2017-01-15 DIAGNOSIS — Z6834 Body mass index (BMI) 34.0-34.9, adult: Secondary | ICD-10-CM | POA: Diagnosis not present

## 2017-01-15 DIAGNOSIS — Z96653 Presence of artificial knee joint, bilateral: Secondary | ICD-10-CM | POA: Diagnosis not present

## 2017-01-15 DIAGNOSIS — E039 Hypothyroidism, unspecified: Secondary | ICD-10-CM | POA: Diagnosis not present

## 2017-01-15 DIAGNOSIS — Z96611 Presence of right artificial shoulder joint: Secondary | ICD-10-CM | POA: Diagnosis not present

## 2017-01-15 DIAGNOSIS — M545 Low back pain: Secondary | ICD-10-CM | POA: Diagnosis not present

## 2017-01-21 DIAGNOSIS — Z96653 Presence of artificial knee joint, bilateral: Secondary | ICD-10-CM | POA: Diagnosis not present

## 2017-01-21 DIAGNOSIS — M25561 Pain in right knee: Secondary | ICD-10-CM | POA: Diagnosis not present

## 2017-01-21 DIAGNOSIS — Z471 Aftercare following joint replacement surgery: Secondary | ICD-10-CM | POA: Diagnosis not present

## 2017-01-27 DIAGNOSIS — Z111 Encounter for screening for respiratory tuberculosis: Secondary | ICD-10-CM | POA: Diagnosis not present

## 2017-03-18 DIAGNOSIS — I10 Essential (primary) hypertension: Secondary | ICD-10-CM | POA: Diagnosis not present

## 2017-03-18 DIAGNOSIS — R600 Localized edema: Secondary | ICD-10-CM | POA: Diagnosis not present

## 2017-03-18 DIAGNOSIS — E119 Type 2 diabetes mellitus without complications: Secondary | ICD-10-CM | POA: Diagnosis not present

## 2017-03-18 DIAGNOSIS — J449 Chronic obstructive pulmonary disease, unspecified: Secondary | ICD-10-CM | POA: Diagnosis not present

## 2017-04-23 DIAGNOSIS — Z1231 Encounter for screening mammogram for malignant neoplasm of breast: Secondary | ICD-10-CM | POA: Diagnosis not present

## 2017-04-23 DIAGNOSIS — Z803 Family history of malignant neoplasm of breast: Secondary | ICD-10-CM | POA: Diagnosis not present

## 2017-05-30 ENCOUNTER — Ambulatory Visit: Payer: Commercial Managed Care - HMO | Admitting: Podiatry

## 2017-06-04 ENCOUNTER — Ambulatory Visit: Payer: Commercial Managed Care - HMO | Admitting: Podiatry

## 2017-06-06 ENCOUNTER — Ambulatory Visit: Payer: Commercial Managed Care - HMO | Admitting: Podiatry

## 2017-06-17 DIAGNOSIS — E559 Vitamin D deficiency, unspecified: Secondary | ICD-10-CM | POA: Diagnosis not present

## 2017-06-17 DIAGNOSIS — Z0000 Encounter for general adult medical examination without abnormal findings: Secondary | ICD-10-CM | POA: Diagnosis not present

## 2017-06-17 DIAGNOSIS — R600 Localized edema: Secondary | ICD-10-CM | POA: Diagnosis not present

## 2017-06-17 DIAGNOSIS — Z1389 Encounter for screening for other disorder: Secondary | ICD-10-CM | POA: Diagnosis not present

## 2017-06-17 DIAGNOSIS — E7800 Pure hypercholesterolemia, unspecified: Secondary | ICD-10-CM | POA: Diagnosis not present

## 2017-06-17 DIAGNOSIS — E049 Nontoxic goiter, unspecified: Secondary | ICD-10-CM | POA: Diagnosis not present

## 2017-06-17 DIAGNOSIS — I10 Essential (primary) hypertension: Secondary | ICD-10-CM | POA: Diagnosis not present

## 2017-06-17 DIAGNOSIS — K219 Gastro-esophageal reflux disease without esophagitis: Secondary | ICD-10-CM | POA: Diagnosis not present

## 2017-06-17 DIAGNOSIS — Z23 Encounter for immunization: Secondary | ICD-10-CM | POA: Diagnosis not present

## 2017-06-17 DIAGNOSIS — E039 Hypothyroidism, unspecified: Secondary | ICD-10-CM | POA: Diagnosis not present

## 2017-06-17 DIAGNOSIS — E119 Type 2 diabetes mellitus without complications: Secondary | ICD-10-CM | POA: Diagnosis not present

## 2017-06-17 DIAGNOSIS — Z79899 Other long term (current) drug therapy: Secondary | ICD-10-CM | POA: Diagnosis not present

## 2017-08-11 DIAGNOSIS — J069 Acute upper respiratory infection, unspecified: Secondary | ICD-10-CM | POA: Diagnosis not present

## 2017-09-02 DIAGNOSIS — F17200 Nicotine dependence, unspecified, uncomplicated: Secondary | ICD-10-CM | POA: Diagnosis not present

## 2017-09-02 DIAGNOSIS — R609 Edema, unspecified: Secondary | ICD-10-CM | POA: Diagnosis not present

## 2017-09-02 DIAGNOSIS — Z9049 Acquired absence of other specified parts of digestive tract: Secondary | ICD-10-CM | POA: Diagnosis not present

## 2017-09-02 DIAGNOSIS — M545 Low back pain: Secondary | ICD-10-CM | POA: Diagnosis not present

## 2017-09-02 DIAGNOSIS — Z6832 Body mass index (BMI) 32.0-32.9, adult: Secondary | ICD-10-CM | POA: Diagnosis not present

## 2017-09-02 DIAGNOSIS — E890 Postprocedural hypothyroidism: Secondary | ICD-10-CM | POA: Diagnosis not present

## 2017-09-02 DIAGNOSIS — E669 Obesity, unspecified: Secondary | ICD-10-CM | POA: Diagnosis not present

## 2017-09-02 DIAGNOSIS — M159 Polyosteoarthritis, unspecified: Secondary | ICD-10-CM | POA: Diagnosis not present

## 2017-09-02 DIAGNOSIS — J45909 Unspecified asthma, uncomplicated: Secondary | ICD-10-CM | POA: Diagnosis not present

## 2017-09-30 DIAGNOSIS — E119 Type 2 diabetes mellitus without complications: Secondary | ICD-10-CM | POA: Diagnosis not present

## 2017-09-30 DIAGNOSIS — H524 Presbyopia: Secondary | ICD-10-CM | POA: Diagnosis not present

## 2017-10-20 DIAGNOSIS — R69 Illness, unspecified: Secondary | ICD-10-CM | POA: Diagnosis not present

## 2017-11-03 DIAGNOSIS — K635 Polyp of colon: Secondary | ICD-10-CM | POA: Diagnosis not present

## 2017-11-03 DIAGNOSIS — K5730 Diverticulosis of large intestine without perforation or abscess without bleeding: Secondary | ICD-10-CM | POA: Diagnosis not present

## 2017-11-03 DIAGNOSIS — Z1211 Encounter for screening for malignant neoplasm of colon: Secondary | ICD-10-CM | POA: Diagnosis not present

## 2017-11-03 DIAGNOSIS — K640 First degree hemorrhoids: Secondary | ICD-10-CM | POA: Diagnosis not present

## 2017-11-05 DIAGNOSIS — K635 Polyp of colon: Secondary | ICD-10-CM | POA: Diagnosis not present

## 2017-12-17 DIAGNOSIS — I10 Essential (primary) hypertension: Secondary | ICD-10-CM | POA: Diagnosis not present

## 2017-12-17 DIAGNOSIS — J440 Chronic obstructive pulmonary disease with acute lower respiratory infection: Secondary | ICD-10-CM | POA: Diagnosis not present

## 2017-12-17 DIAGNOSIS — E039 Hypothyroidism, unspecified: Secondary | ICD-10-CM | POA: Diagnosis not present

## 2017-12-17 DIAGNOSIS — M545 Low back pain: Secondary | ICD-10-CM | POA: Diagnosis not present

## 2017-12-17 DIAGNOSIS — E782 Mixed hyperlipidemia: Secondary | ICD-10-CM | POA: Diagnosis not present

## 2017-12-17 DIAGNOSIS — J4520 Mild intermittent asthma, uncomplicated: Secondary | ICD-10-CM | POA: Diagnosis not present

## 2017-12-17 DIAGNOSIS — M25579 Pain in unspecified ankle and joints of unspecified foot: Secondary | ICD-10-CM | POA: Diagnosis not present

## 2017-12-17 DIAGNOSIS — R7303 Prediabetes: Secondary | ICD-10-CM | POA: Diagnosis not present

## 2017-12-17 DIAGNOSIS — K219 Gastro-esophageal reflux disease without esophagitis: Secondary | ICD-10-CM | POA: Diagnosis not present

## 2018-01-22 DIAGNOSIS — J309 Allergic rhinitis, unspecified: Secondary | ICD-10-CM | POA: Diagnosis not present

## 2018-05-18 DIAGNOSIS — Z1231 Encounter for screening mammogram for malignant neoplasm of breast: Secondary | ICD-10-CM | POA: Diagnosis not present

## 2018-05-18 DIAGNOSIS — Z803 Family history of malignant neoplasm of breast: Secondary | ICD-10-CM | POA: Diagnosis not present

## 2018-06-30 ENCOUNTER — Other Ambulatory Visit: Payer: Self-pay | Admitting: Internal Medicine

## 2018-06-30 DIAGNOSIS — E559 Vitamin D deficiency, unspecified: Secondary | ICD-10-CM | POA: Diagnosis not present

## 2018-06-30 DIAGNOSIS — R7309 Other abnormal glucose: Secondary | ICD-10-CM | POA: Diagnosis not present

## 2018-06-30 DIAGNOSIS — E7800 Pure hypercholesterolemia, unspecified: Secondary | ICD-10-CM | POA: Diagnosis not present

## 2018-06-30 DIAGNOSIS — R3129 Other microscopic hematuria: Secondary | ICD-10-CM | POA: Diagnosis not present

## 2018-06-30 DIAGNOSIS — Z13820 Encounter for screening for osteoporosis: Secondary | ICD-10-CM

## 2018-06-30 DIAGNOSIS — Z1389 Encounter for screening for other disorder: Secondary | ICD-10-CM | POA: Diagnosis not present

## 2018-06-30 DIAGNOSIS — Z0000 Encounter for general adult medical examination without abnormal findings: Secondary | ICD-10-CM | POA: Diagnosis not present

## 2018-06-30 DIAGNOSIS — Z23 Encounter for immunization: Secondary | ICD-10-CM | POA: Diagnosis not present

## 2018-06-30 DIAGNOSIS — E039 Hypothyroidism, unspecified: Secondary | ICD-10-CM | POA: Diagnosis not present

## 2018-06-30 DIAGNOSIS — K219 Gastro-esophageal reflux disease without esophagitis: Secondary | ICD-10-CM | POA: Diagnosis not present

## 2018-06-30 DIAGNOSIS — I10 Essential (primary) hypertension: Secondary | ICD-10-CM | POA: Diagnosis not present

## 2018-07-08 ENCOUNTER — Other Ambulatory Visit: Payer: Self-pay | Admitting: Internal Medicine

## 2018-07-08 DIAGNOSIS — M8580 Other specified disorders of bone density and structure, unspecified site: Secondary | ICD-10-CM

## 2018-07-20 DIAGNOSIS — J0100 Acute maxillary sinusitis, unspecified: Secondary | ICD-10-CM | POA: Diagnosis not present

## 2018-07-22 ENCOUNTER — Other Ambulatory Visit: Payer: Commercial Managed Care - HMO

## 2018-08-10 ENCOUNTER — Ambulatory Visit
Admission: RE | Admit: 2018-08-10 | Discharge: 2018-08-10 | Disposition: A | Discharge status: Home / Self Care | Payer: Medicare HMO | Source: Ambulatory Visit | Attending: Internal Medicine | Admitting: Internal Medicine

## 2018-08-10 DIAGNOSIS — M8580 Other specified disorders of bone density and structure, unspecified site: Secondary | ICD-10-CM

## 2018-08-10 DIAGNOSIS — Z13820 Encounter for screening for osteoporosis: Secondary | ICD-10-CM | POA: Diagnosis not present

## 2018-08-10 DIAGNOSIS — Z780 Asymptomatic menopausal state: Secondary | ICD-10-CM | POA: Diagnosis not present

## 2018-10-03 ENCOUNTER — Encounter (HOSPITAL_COMMUNITY): Payer: Self-pay | Admitting: Emergency Medicine

## 2018-10-03 ENCOUNTER — Emergency Department (HOSPITAL_COMMUNITY)
Admission: EM | Admit: 2018-10-03 | Discharge: 2018-10-03 | Disposition: A | Discharge status: Home / Self Care | Payer: Medicare HMO | Attending: Emergency Medicine | Admitting: Emergency Medicine

## 2018-10-03 ENCOUNTER — Other Ambulatory Visit: Payer: Self-pay

## 2018-10-03 DIAGNOSIS — Z79899 Other long term (current) drug therapy: Secondary | ICD-10-CM | POA: Diagnosis not present

## 2018-10-03 DIAGNOSIS — H60392 Other infective otitis externa, left ear: Secondary | ICD-10-CM | POA: Insufficient documentation

## 2018-10-03 DIAGNOSIS — E039 Hypothyroidism, unspecified: Secondary | ICD-10-CM | POA: Diagnosis not present

## 2018-10-03 DIAGNOSIS — I10 Essential (primary) hypertension: Secondary | ICD-10-CM | POA: Insufficient documentation

## 2018-10-03 DIAGNOSIS — H6982 Other specified disorders of Eustachian tube, left ear: Secondary | ICD-10-CM

## 2018-10-03 DIAGNOSIS — F17210 Nicotine dependence, cigarettes, uncomplicated: Secondary | ICD-10-CM | POA: Insufficient documentation

## 2018-10-03 DIAGNOSIS — J45909 Unspecified asthma, uncomplicated: Secondary | ICD-10-CM | POA: Insufficient documentation

## 2018-10-03 DIAGNOSIS — E119 Type 2 diabetes mellitus without complications: Secondary | ICD-10-CM | POA: Diagnosis not present

## 2018-10-03 DIAGNOSIS — H60391 Other infective otitis externa, right ear: Secondary | ICD-10-CM | POA: Diagnosis not present

## 2018-10-03 DIAGNOSIS — H6992 Unspecified Eustachian tube disorder, left ear: Secondary | ICD-10-CM | POA: Diagnosis not present

## 2018-10-03 DIAGNOSIS — H9202 Otalgia, left ear: Secondary | ICD-10-CM | POA: Diagnosis present

## 2018-10-03 MED ORDER — FLUTICASONE PROPIONATE 50 MCG/ACT NA SUSP: 1.0000 | Freq: Every day | NASAL | 2 refills | Status: DC

## 2018-10-03 MED ORDER — NEOMYCIN-POLYMYXIN-HC 3.5-10000-1 OT SUSP: 3.0000 [drp] | Freq: Four times a day (QID) | OTIC | 0 refills | Status: AC

## 2018-10-03 NOTE — ED Provider Notes (Signed)
[facility] EMERGENCY DEPARTMENT Provider Note   CSN: [id number] Arrival date & time: 10/03/18  1914    History   Chief Complaint Chief Complaint  Patient presents with  . Otalgia    HPI Tina Neal is a 68 y.o. female.     The history is provided by the patient.  Otalgia  Location:  Left Behind ear:  No abnormality Quality:  Throbbing Severity:  Moderate Onset quality:  Sudden Duration:  20 hours Timing:  Constant Progression:  Waxing and waning Chronicity:  New Context: not direct blow, not elevation change, not foreign body in ear, not loud noise, not recent URI and not water in ear   Relieved by:  None tried Worsened by:  Nothing Ineffective treatments:  None tried Associated symptoms: hearing loss   Associated symptoms: no abdominal pain, no congestion, no cough, no diarrhea, no ear discharge, no fever, no headaches, no neck pain, no rash, no rhinorrhea, no sore throat and no tinnitus   Associated symptoms comment:  No vertigo Risk factors: no recent travel, no chronic ear infection and no prior ear surgery     Past Medical History:  Diagnosis Date  . Arthritis   . Asthma   . Diabetes mellitus without complication ([place])   . GERD (gastroesophageal reflux disease)   . Hyperlipidemia   . Hypertension   . Hypothyroidism   . Pancreatitis    history    Patient Active Problem List   Diagnosis Date Noted  . Calcific tendinitis of left shoulder 06/22/2012  . Acromioclavicular arthrosis 06/22/2012  . Postlaminectomy syndrome, lumbar region 01/28/2012  . Disorders of sacrum 01/28/2012  . Other chronic postoperative pain 01/28/2012  . Pancreatitis 07/13/2011  . Hypertension 07/13/2011  . COPD (chronic obstructive pulmonary disease) ([place]) 07/13/2011    Past Surgical History:  Procedure Laterality Date  . ABDOMINAL HYSTERECTOMY    . BACK SURGERY  2011   lumbar fusion  . CALCANEAL OSTEOTOMY Left 10/28/2013   Procedure: LEFT CALCANEAL  OSTEOTOMY;  Surgeon: Wylene Simmer, MD;  Location: [facility];  Service: Orthopedics;  Laterality: Left;  . CHOLECYSTECTOMY  2009  . GASTROC RECESSION EXTREMITY Left 10/28/2013   Procedure: LEFT GASTROCNEMIUS RECESSION ;  Surgeon: Wylene Simmer, MD;  Location: [place];  Service: Orthopedics;  Laterality: Left;  . JOINT REPLACEMENT     bilat total knees  . KNEE SURGERY     right  . rotator cuff     rt  . TENDON TRANSFER Left 10/28/2013   Procedure: LEFT FLEXOR DIGITORUM LONGUS TRANSFER, POSTERIOR TIBIAL TENOLYSIS;  Surgeon: Wylene Simmer, MD;  Location: [facility];  Service: Orthopedics;  Laterality: Left;  . THYROIDECTOMY       OB History   No obstetric history on file.      Home Medications    Prior to Admission medications   Medication Sig Start Date End Date Taking? Authorizing Provider  albuterol (PROVENTIL HFA;VENTOLIN HFA) 108 (90 BASE) MCG/ACT inhaler Inhale 2 puffs into the lungs 4 (four) times daily. 07/15/11 07/14/12  Dhungel, Flonnie Overman, MD  calcium citrate-vitamin D (CITRACAL+D) 315-200 MG-UNIT per tablet Take 1 tablet by mouth 2 (two) times daily.    [provider]  diclofenac sodium (VOLTAREN) 1 % GEL Apply 1 application topically 3 (three) times daily as needed. For pain     [provider]  Esomeprazole Magnesium (NEXIUM PO) Take 40 mg by mouth daily.     [provider]  Levothyroxine Sodium (LEVOXYL PO) Take 75 mcg by mouth daily.     [provider]  meloxicam (MOBIC) 15 MG tablet Take 0.5-1 tablets (7.5-15 mg total) by mouth daily. 04/21/15   Waldemar Dickens, MD  methocarbamol (ROBAXIN) 500 MG tablet Take 1-2 tablets (500-1,000 mg total) by mouth every 6 (six) hours as needed for muscle spasms. 04/21/15   Waldemar Dickens, MD  Montelukast Sodium (SINGULAIR PO) Take 10 mg by mouth daily.     [provider]  spironolactone (ALDACTONE) 25 MG tablet Take 25 mg by mouth 2 (two) times  daily.      [provider]  sulfamethoxazole-trimethoprim (BACTRIM DS,SEPTRA DS) 800-160 MG tablet Take 1 tablet by mouth 2 (two) times daily. 06/21/16   Edrick Kins, DPM    Family History Family History  Problem Relation Age of Onset  . Asthma Mother   . Cancer Father   . Sleep apnea Other   . Diabetes Other   . Hypertension Other     Social History Social History   Tobacco Use  . Smoking status: Current Every Day Smoker    Packs/day: 0.50    Types: Cigarettes  . Smokeless tobacco: Never Used  Substance Use Topics  . Alcohol use: No    Comment: occasional  . Drug use: No     Allergies   Maxidex [dexamethasone] and Hctz [hydrochlorothiazide]   Review of Systems Review of Systems  Constitutional: Negative for fever.  HENT: Positive for ear pain and hearing loss. Negative for congestion, ear discharge, rhinorrhea, sore throat and tinnitus.   Respiratory: Negative for cough.   Gastrointestinal: Negative for abdominal pain and diarrhea.  Musculoskeletal: Negative for neck pain.  Skin: Negative for rash.  Neurological: Negative for dizziness and headaches.     Physical Exam Updated Vital Signs BP (!) 141/93 (BP Location: Right Arm)   Pulse 81   Temp 97.9 F (36.6 C) (Oral)   Resp 16   SpO2 99%   Physical Exam Vitals signs and nursing note reviewed.  Constitutional:      General: She is not in acute distress.    Appearance: She is well-developed. She is not diaphoretic.  HENT:     Head: Normocephalic and atraumatic.     Right Ear: Hearing, tympanic membrane, ear canal and external ear normal.     Left Ear: No tenderness. No mastoid tenderness. Tympanic membrane is retracted. Tympanic membrane is not perforated.     Ears:      Comments: No lesions Eyes:     General: No scleral icterus.    Conjunctiva/sclera: Conjunctivae normal.  Neck:     Musculoskeletal: Normal range of motion.  Cardiovascular:     Rate and Rhythm: Normal rate and  regular rhythm.     Heart sounds: Normal heart sounds. No murmur. No friction rub. No gallop.   Pulmonary:     Effort: Pulmonary effort is normal. No respiratory distress.     Breath sounds: Normal breath sounds.  Abdominal:     General: Bowel sounds are normal. There is no distension.     Palpations: Abdomen is soft. There is no mass.     Tenderness: There is no abdominal tenderness. There is no guarding.  Skin:    General: Skin is warm and dry.  Neurological:     Mental Status: She is alert and oriented to person, place, and time.  Psychiatric:        Behavior: Behavior normal.  ED Treatments / Results  Labs (all labs ordered are listed, but only abnormal results are displayed) Labs Reviewed - No data to display  EKG None  Radiology No results found.  Procedures Procedures (including critical care time)  Medications Ordered in ED Medications - No data to display   Initial Impression / Assessment and Plan / ED Course  I have reviewed the triage vital signs and the nursing notes.  Pertinent labs & imaging results that were available during my care of the patient were reviewed by me and considered in my medical decision making (see chart for details).        Feels that way 68 year old female presents with complaint of left ear pain that woke her from sleep last night.  She describes the pain as throbbing.  She has had decreased hearing however patient appears to be hard of hearing as well.  She denies any barotrauma exposure to loud noises.  She denies neck stiffness or pain.  She denies severe headache or photophobia.  On exam the patient appears to have a swollen canal, retracted left eardrum.  I do not see any lesions or discharge from the ear.  No mastoid tenderness suggestive of mastoiditis.  She does not have any other symptoms of meningitis.  I suspect this is otitis externa and some eustachian tube dysfunction.  Patient does have dark line across her nasal  bridge suggestive of chronic nasal allergies.  She will be given Flonase and Cortisporin otic suspension.  She is advised to follow-up with her primary care physician by next Tuesday or sooner if symptoms are worsening.  Final Clinical Impressions(s) / ED Diagnoses   Final diagnoses:  None    ED Discharge Orders    None       Margarita Mail, PA-C 10/03/18 2003    Malvin Johns, MD 10/03/18 2130

## 2018-10-03 NOTE — Discharge Instructions (Signed)
Contact a health care provider if: You have a fever. Your ear is still red, swollen, painful, or draining pus after 3 days. Your redness, swelling, or pain gets worse. You have a severe headache. You have redness, swelling, pain, or tenderness in the area behind your ear.

## 2018-10-03 NOTE — ED Triage Notes (Signed)
Pt c/o ear pain and muffled sound to left ear that started this morning.

## 2018-11-04 DIAGNOSIS — H6690 Otitis media, unspecified, unspecified ear: Secondary | ICD-10-CM | POA: Diagnosis not present

## 2018-11-25 DIAGNOSIS — M25552 Pain in left hip: Secondary | ICD-10-CM | POA: Diagnosis not present

## 2018-11-26 DIAGNOSIS — M79604 Pain in right leg: Secondary | ICD-10-CM | POA: Diagnosis not present

## 2018-11-26 DIAGNOSIS — M7061 Trochanteric bursitis, right hip: Secondary | ICD-10-CM | POA: Diagnosis not present

## 2018-12-11 DIAGNOSIS — E119 Type 2 diabetes mellitus without complications: Secondary | ICD-10-CM | POA: Diagnosis not present

## 2018-12-11 DIAGNOSIS — H2513 Age-related nuclear cataract, bilateral: Secondary | ICD-10-CM | POA: Diagnosis not present

## 2018-12-11 DIAGNOSIS — H524 Presbyopia: Secondary | ICD-10-CM | POA: Diagnosis not present

## 2018-12-11 DIAGNOSIS — M545 Low back pain: Secondary | ICD-10-CM | POA: Diagnosis not present

## 2018-12-24 DIAGNOSIS — M5416 Radiculopathy, lumbar region: Secondary | ICD-10-CM | POA: Diagnosis not present

## 2018-12-31 DIAGNOSIS — M545 Low back pain: Secondary | ICD-10-CM | POA: Diagnosis not present

## 2018-12-31 DIAGNOSIS — E039 Hypothyroidism, unspecified: Secondary | ICD-10-CM | POA: Diagnosis not present

## 2018-12-31 DIAGNOSIS — E782 Mixed hyperlipidemia: Secondary | ICD-10-CM | POA: Diagnosis not present

## 2018-12-31 DIAGNOSIS — E049 Nontoxic goiter, unspecified: Secondary | ICD-10-CM | POA: Diagnosis not present

## 2018-12-31 DIAGNOSIS — I10 Essential (primary) hypertension: Secondary | ICD-10-CM | POA: Diagnosis not present

## 2018-12-31 DIAGNOSIS — M25552 Pain in left hip: Secondary | ICD-10-CM | POA: Diagnosis not present

## 2018-12-31 DIAGNOSIS — F17210 Nicotine dependence, cigarettes, uncomplicated: Secondary | ICD-10-CM | POA: Diagnosis not present

## 2018-12-31 DIAGNOSIS — J449 Chronic obstructive pulmonary disease, unspecified: Secondary | ICD-10-CM | POA: Diagnosis not present

## 2018-12-31 DIAGNOSIS — E559 Vitamin D deficiency, unspecified: Secondary | ICD-10-CM | POA: Diagnosis not present

## 2019-01-04 DIAGNOSIS — M5416 Radiculopathy, lumbar region: Secondary | ICD-10-CM | POA: Diagnosis not present

## 2019-01-06 DIAGNOSIS — E039 Hypothyroidism, unspecified: Secondary | ICD-10-CM | POA: Diagnosis not present

## 2019-01-06 DIAGNOSIS — E119 Type 2 diabetes mellitus without complications: Secondary | ICD-10-CM | POA: Diagnosis not present

## 2019-01-06 DIAGNOSIS — E559 Vitamin D deficiency, unspecified: Secondary | ICD-10-CM | POA: Diagnosis not present

## 2019-01-06 DIAGNOSIS — E782 Mixed hyperlipidemia: Secondary | ICD-10-CM | POA: Diagnosis not present

## 2019-01-11 DIAGNOSIS — M5416 Radiculopathy, lumbar region: Secondary | ICD-10-CM | POA: Diagnosis not present

## 2019-01-27 DIAGNOSIS — M5416 Radiculopathy, lumbar region: Secondary | ICD-10-CM | POA: Diagnosis not present

## 2019-02-04 DIAGNOSIS — M5416 Radiculopathy, lumbar region: Secondary | ICD-10-CM | POA: Diagnosis not present

## 2019-02-10 DIAGNOSIS — M5416 Radiculopathy, lumbar region: Secondary | ICD-10-CM | POA: Diagnosis not present

## 2019-02-12 DIAGNOSIS — M533 Sacrococcygeal disorders, not elsewhere classified: Secondary | ICD-10-CM | POA: Diagnosis not present

## 2019-03-03 DIAGNOSIS — M533 Sacrococcygeal disorders, not elsewhere classified: Secondary | ICD-10-CM | POA: Diagnosis not present

## 2019-03-19 DIAGNOSIS — M961 Postlaminectomy syndrome, not elsewhere classified: Secondary | ICD-10-CM | POA: Diagnosis not present

## 2019-03-19 DIAGNOSIS — M5416 Radiculopathy, lumbar region: Secondary | ICD-10-CM | POA: Diagnosis not present

## 2019-03-19 DIAGNOSIS — M4310 Spondylolisthesis, site unspecified: Secondary | ICD-10-CM | POA: Diagnosis not present

## 2019-03-19 DIAGNOSIS — M9904 Segmental and somatic dysfunction of sacral region: Secondary | ICD-10-CM | POA: Diagnosis not present

## 2019-04-01 DIAGNOSIS — J449 Chronic obstructive pulmonary disease, unspecified: Secondary | ICD-10-CM | POA: Diagnosis not present

## 2019-04-01 DIAGNOSIS — F17210 Nicotine dependence, cigarettes, uncomplicated: Secondary | ICD-10-CM | POA: Diagnosis not present

## 2019-04-01 DIAGNOSIS — E782 Mixed hyperlipidemia: Secondary | ICD-10-CM | POA: Diagnosis not present

## 2019-04-01 DIAGNOSIS — I10 Essential (primary) hypertension: Secondary | ICD-10-CM | POA: Diagnosis not present

## 2019-04-01 DIAGNOSIS — J4520 Mild intermittent asthma, uncomplicated: Secondary | ICD-10-CM | POA: Diagnosis not present

## 2019-04-01 DIAGNOSIS — E039 Hypothyroidism, unspecified: Secondary | ICD-10-CM | POA: Diagnosis not present

## 2019-04-01 DIAGNOSIS — E559 Vitamin D deficiency, unspecified: Secondary | ICD-10-CM | POA: Diagnosis not present

## 2019-04-01 DIAGNOSIS — M545 Low back pain: Secondary | ICD-10-CM | POA: Diagnosis not present

## 2019-04-01 DIAGNOSIS — E119 Type 2 diabetes mellitus without complications: Secondary | ICD-10-CM | POA: Diagnosis not present

## 2019-04-08 DIAGNOSIS — M5136 Other intervertebral disc degeneration, lumbar region: Secondary | ICD-10-CM | POA: Diagnosis not present

## 2019-04-26 DIAGNOSIS — M5416 Radiculopathy, lumbar region: Secondary | ICD-10-CM | POA: Diagnosis not present

## 2019-04-26 DIAGNOSIS — M961 Postlaminectomy syndrome, not elsewhere classified: Secondary | ICD-10-CM | POA: Diagnosis not present

## 2019-04-26 DIAGNOSIS — M5136 Other intervertebral disc degeneration, lumbar region: Secondary | ICD-10-CM | POA: Diagnosis not present

## 2019-04-26 DIAGNOSIS — M545 Low back pain: Secondary | ICD-10-CM | POA: Diagnosis not present

## 2019-04-29 DIAGNOSIS — Z23 Encounter for immunization: Secondary | ICD-10-CM | POA: Diagnosis not present

## 2019-05-03 DIAGNOSIS — M545 Low back pain: Secondary | ICD-10-CM | POA: Diagnosis not present

## 2019-05-10 DIAGNOSIS — M5416 Radiculopathy, lumbar region: Secondary | ICD-10-CM | POA: Diagnosis not present

## 2019-05-10 DIAGNOSIS — M4310 Spondylolisthesis, site unspecified: Secondary | ICD-10-CM | POA: Diagnosis not present

## 2019-05-10 DIAGNOSIS — M961 Postlaminectomy syndrome, not elsewhere classified: Secondary | ICD-10-CM | POA: Diagnosis not present

## 2019-06-01 DIAGNOSIS — M4310 Spondylolisthesis, site unspecified: Secondary | ICD-10-CM | POA: Diagnosis not present

## 2019-06-01 DIAGNOSIS — Z803 Family history of malignant neoplasm of breast: Secondary | ICD-10-CM | POA: Diagnosis not present

## 2019-06-01 DIAGNOSIS — M5416 Radiculopathy, lumbar region: Secondary | ICD-10-CM | POA: Diagnosis not present

## 2019-06-01 DIAGNOSIS — Z1231 Encounter for screening mammogram for malignant neoplasm of breast: Secondary | ICD-10-CM | POA: Diagnosis not present

## 2019-06-15 DIAGNOSIS — M545 Low back pain: Secondary | ICD-10-CM | POA: Diagnosis not present

## 2019-06-15 DIAGNOSIS — M5136 Other intervertebral disc degeneration, lumbar region: Secondary | ICD-10-CM | POA: Diagnosis not present

## 2019-06-15 DIAGNOSIS — Z981 Arthrodesis status: Secondary | ICD-10-CM | POA: Diagnosis not present

## 2019-06-15 DIAGNOSIS — M5416 Radiculopathy, lumbar region: Secondary | ICD-10-CM | POA: Diagnosis not present

## 2019-06-23 DIAGNOSIS — E039 Hypothyroidism, unspecified: Secondary | ICD-10-CM | POA: Diagnosis not present

## 2019-06-23 DIAGNOSIS — I10 Essential (primary) hypertension: Secondary | ICD-10-CM | POA: Diagnosis not present

## 2019-06-23 DIAGNOSIS — E559 Vitamin D deficiency, unspecified: Secondary | ICD-10-CM | POA: Diagnosis not present

## 2019-06-23 DIAGNOSIS — E1165 Type 2 diabetes mellitus with hyperglycemia: Secondary | ICD-10-CM | POA: Diagnosis not present

## 2019-06-23 DIAGNOSIS — J4520 Mild intermittent asthma, uncomplicated: Secondary | ICD-10-CM | POA: Diagnosis not present

## 2019-06-23 DIAGNOSIS — E119 Type 2 diabetes mellitus without complications: Secondary | ICD-10-CM | POA: Diagnosis not present

## 2019-06-23 DIAGNOSIS — J449 Chronic obstructive pulmonary disease, unspecified: Secondary | ICD-10-CM | POA: Diagnosis not present

## 2019-06-23 DIAGNOSIS — F17210 Nicotine dependence, cigarettes, uncomplicated: Secondary | ICD-10-CM | POA: Diagnosis not present

## 2019-06-23 DIAGNOSIS — R350 Frequency of micturition: Secondary | ICD-10-CM | POA: Diagnosis not present

## 2019-06-23 DIAGNOSIS — E782 Mixed hyperlipidemia: Secondary | ICD-10-CM | POA: Diagnosis not present

## 2019-06-23 DIAGNOSIS — M545 Low back pain: Secondary | ICD-10-CM | POA: Diagnosis not present

## 2019-07-13 ENCOUNTER — Ambulatory Visit: Payer: Self-pay | Admitting: Orthopedic Surgery

## 2019-07-15 DIAGNOSIS — E782 Mixed hyperlipidemia: Secondary | ICD-10-CM | POA: Diagnosis not present

## 2019-07-15 DIAGNOSIS — E119 Type 2 diabetes mellitus without complications: Secondary | ICD-10-CM | POA: Diagnosis not present

## 2019-07-15 DIAGNOSIS — M545 Low back pain: Secondary | ICD-10-CM | POA: Diagnosis not present

## 2019-07-15 DIAGNOSIS — J449 Chronic obstructive pulmonary disease, unspecified: Secondary | ICD-10-CM | POA: Diagnosis not present

## 2019-07-15 DIAGNOSIS — I10 Essential (primary) hypertension: Secondary | ICD-10-CM | POA: Diagnosis not present

## 2019-07-15 DIAGNOSIS — Z23 Encounter for immunization: Secondary | ICD-10-CM | POA: Diagnosis not present

## 2019-07-15 DIAGNOSIS — F17210 Nicotine dependence, cigarettes, uncomplicated: Secondary | ICD-10-CM | POA: Diagnosis not present

## 2019-07-15 DIAGNOSIS — E039 Hypothyroidism, unspecified: Secondary | ICD-10-CM | POA: Diagnosis not present

## 2019-07-15 DIAGNOSIS — Z0000 Encounter for general adult medical examination without abnormal findings: Secondary | ICD-10-CM | POA: Diagnosis not present

## 2019-07-15 DIAGNOSIS — J4520 Mild intermittent asthma, uncomplicated: Secondary | ICD-10-CM | POA: Diagnosis not present

## 2019-07-15 DIAGNOSIS — E559 Vitamin D deficiency, unspecified: Secondary | ICD-10-CM | POA: Diagnosis not present

## 2019-07-15 DIAGNOSIS — Z1389 Encounter for screening for other disorder: Secondary | ICD-10-CM | POA: Diagnosis not present

## 2019-07-26 DIAGNOSIS — H6692 Otitis media, unspecified, left ear: Secondary | ICD-10-CM | POA: Diagnosis not present

## 2019-07-26 DIAGNOSIS — H6122 Impacted cerumen, left ear: Secondary | ICD-10-CM | POA: Diagnosis not present

## 2019-07-26 DIAGNOSIS — H6120 Impacted cerumen, unspecified ear: Secondary | ICD-10-CM | POA: Diagnosis not present

## 2019-08-03 DIAGNOSIS — H9312 Tinnitus, left ear: Secondary | ICD-10-CM | POA: Diagnosis not present

## 2019-08-03 DIAGNOSIS — H6121 Impacted cerumen, right ear: Secondary | ICD-10-CM | POA: Diagnosis not present

## 2019-08-10 ENCOUNTER — Ambulatory Visit: Payer: Medicare HMO | Admitting: Registered"

## 2019-08-16 DIAGNOSIS — H6983 Other specified disorders of Eustachian tube, bilateral: Secondary | ICD-10-CM | POA: Diagnosis not present

## 2019-08-16 DIAGNOSIS — H9313 Tinnitus, bilateral: Secondary | ICD-10-CM | POA: Diagnosis not present

## 2019-08-26 ENCOUNTER — Inpatient Hospital Stay: Admit: 2019-08-26 | Payer: Medicare HMO | Admitting: Orthopedic Surgery

## 2019-08-26 SURGERY — ANTERIOR LATERAL LUMBAR FUSION 1 LEVEL
Anesthesia: General

## 2019-09-01 ENCOUNTER — Ambulatory Visit: Payer: Medicare HMO

## 2019-09-02 DIAGNOSIS — R69 Illness, unspecified: Secondary | ICD-10-CM | POA: Diagnosis not present

## 2019-09-09 ENCOUNTER — Ambulatory Visit: Payer: Medicare HMO | Attending: Internal Medicine

## 2019-09-09 DIAGNOSIS — Z23 Encounter for immunization: Secondary | ICD-10-CM | POA: Insufficient documentation

## 2019-09-09 NOTE — Progress Notes (Signed)
   Covid-19 Vaccination Clinic  Name:  Tina Neal    MRN: [id number] DOB: Mar 20, 1951  09/09/2019  [name] was observed post Covid-19 immunization for 15 minutes without incidence. She was provided with Vaccine Information Sheet and instruction to access the V-Safe system.   [name] was instructed to call 911 with any severe reactions post vaccine: Marland Kitchen Difficulty breathing  . Swelling of your face and throat  . A fast heartbeat  . A bad rash all over your body  . Dizziness and weakness    Immunizations Administered    Name Date Dose VIS Date Route   Pfizer COVID-19 Vaccine 09/09/2019  3:44 PM 0.3 mL 07/16/2019 Intramuscular   Manufacturer: [place]   Lot: [id number]   [place]: [id number]

## 2019-09-12 ENCOUNTER — Ambulatory Visit: Payer: Medicare HMO

## 2019-09-13 DIAGNOSIS — H9312 Tinnitus, left ear: Secondary | ICD-10-CM | POA: Diagnosis not present

## 2019-09-13 DIAGNOSIS — H9319 Tinnitus, unspecified ear: Secondary | ICD-10-CM | POA: Diagnosis not present

## 2019-09-13 DIAGNOSIS — H9042 Sensorineural hearing loss, unilateral, left ear, with unrestricted hearing on the contralateral side: Secondary | ICD-10-CM | POA: Diagnosis not present

## 2019-09-16 ENCOUNTER — Other Ambulatory Visit: Payer: Self-pay | Admitting: Otolaryngology

## 2019-09-16 DIAGNOSIS — H9319 Tinnitus, unspecified ear: Secondary | ICD-10-CM

## 2019-09-28 DIAGNOSIS — E119 Type 2 diabetes mellitus without complications: Secondary | ICD-10-CM | POA: Diagnosis not present

## 2019-09-28 DIAGNOSIS — I10 Essential (primary) hypertension: Secondary | ICD-10-CM | POA: Diagnosis not present

## 2019-09-28 DIAGNOSIS — M545 Low back pain: Secondary | ICD-10-CM | POA: Diagnosis not present

## 2019-09-28 DIAGNOSIS — E559 Vitamin D deficiency, unspecified: Secondary | ICD-10-CM | POA: Diagnosis not present

## 2019-09-28 DIAGNOSIS — J4520 Mild intermittent asthma, uncomplicated: Secondary | ICD-10-CM | POA: Diagnosis not present

## 2019-09-28 DIAGNOSIS — E782 Mixed hyperlipidemia: Secondary | ICD-10-CM | POA: Diagnosis not present

## 2019-09-28 DIAGNOSIS — J449 Chronic obstructive pulmonary disease, unspecified: Secondary | ICD-10-CM | POA: Diagnosis not present

## 2019-09-28 DIAGNOSIS — F17210 Nicotine dependence, cigarettes, uncomplicated: Secondary | ICD-10-CM | POA: Diagnosis not present

## 2019-09-28 DIAGNOSIS — E039 Hypothyroidism, unspecified: Secondary | ICD-10-CM | POA: Diagnosis not present

## 2019-10-04 ENCOUNTER — Ambulatory Visit: Payer: Medicare HMO | Attending: Internal Medicine

## 2019-10-04 DIAGNOSIS — Z23 Encounter for immunization: Secondary | ICD-10-CM | POA: Insufficient documentation

## 2019-10-04 NOTE — Progress Notes (Signed)
   Covid-19 Vaccination Clinic  Name:  Tina Neal    MRN: [id number] DOB: 12-14-1950  10/04/2019  Tina Neal was observed post Covid-19 immunization for 15 minutes without incidence. She was provided with Vaccine Information Sheet and instruction to access the V-Safe system.   Tina Neal was instructed to call 911 with any severe reactions post vaccine: Marland Kitchen Difficulty breathing  . Swelling of your face and throat  . A fast heartbeat  . A bad rash all over your body  . Dizziness and weakness    Immunizations Administered    Name Date Dose VIS Date Route   Pfizer COVID-19 Vaccine 10/04/2019  4:25 PM 0.3 mL 07/16/2019 Intramuscular   Manufacturer: [place]   Lot: [id number]   [place]: [id number]

## 2019-10-12 ENCOUNTER — Ambulatory Visit
Admission: RE | Admit: 2019-10-12 | Discharge: 2019-10-12 | Disposition: A | Discharge status: Home / Self Care | Payer: Medicare HMO | Source: Ambulatory Visit | Attending: Otolaryngology | Admitting: Otolaryngology

## 2019-10-12 ENCOUNTER — Other Ambulatory Visit: Payer: Self-pay

## 2019-10-12 DIAGNOSIS — H9319 Tinnitus, unspecified ear: Secondary | ICD-10-CM

## 2019-10-29 DIAGNOSIS — E7800 Pure hypercholesterolemia, unspecified: Secondary | ICD-10-CM | POA: Diagnosis not present

## 2019-10-29 DIAGNOSIS — J4520 Mild intermittent asthma, uncomplicated: Secondary | ICD-10-CM | POA: Diagnosis not present

## 2019-10-29 DIAGNOSIS — J449 Chronic obstructive pulmonary disease, unspecified: Secondary | ICD-10-CM | POA: Diagnosis not present

## 2019-10-29 DIAGNOSIS — J45990 Exercise induced bronchospasm: Secondary | ICD-10-CM | POA: Diagnosis not present

## 2019-10-29 DIAGNOSIS — E119 Type 2 diabetes mellitus without complications: Secondary | ICD-10-CM | POA: Diagnosis not present

## 2019-10-29 DIAGNOSIS — E782 Mixed hyperlipidemia: Secondary | ICD-10-CM | POA: Diagnosis not present

## 2019-10-29 DIAGNOSIS — M1990 Unspecified osteoarthritis, unspecified site: Secondary | ICD-10-CM | POA: Diagnosis not present

## 2019-10-29 DIAGNOSIS — E039 Hypothyroidism, unspecified: Secondary | ICD-10-CM | POA: Diagnosis not present

## 2019-10-29 DIAGNOSIS — I10 Essential (primary) hypertension: Secondary | ICD-10-CM | POA: Diagnosis not present

## 2019-11-08 ENCOUNTER — Ambulatory Visit
Admission: RE | Admit: 2019-11-08 | Discharge: 2019-11-08 | Disposition: A | Discharge status: Home / Self Care | Payer: Medicare HMO | Source: Ambulatory Visit | Attending: Otolaryngology | Admitting: Otolaryngology

## 2019-11-08 ENCOUNTER — Other Ambulatory Visit: Payer: Self-pay

## 2019-11-08 DIAGNOSIS — H9190 Unspecified hearing loss, unspecified ear: Secondary | ICD-10-CM | POA: Diagnosis not present

## 2019-11-08 DIAGNOSIS — H9319 Tinnitus, unspecified ear: Secondary | ICD-10-CM | POA: Diagnosis not present

## 2019-11-08 MED ORDER — GADOBENATE DIMEGLUMINE 529 MG/ML IV SOLN
17.0000 mL | Freq: Once | INTRAVENOUS | Status: AC | PRN
  Administered 2019-11-08: 17 mL via INTRAVENOUS

## 2019-11-16 DIAGNOSIS — H9319 Tinnitus, unspecified ear: Secondary | ICD-10-CM | POA: Diagnosis not present

## 2019-11-25 DIAGNOSIS — E119 Type 2 diabetes mellitus without complications: Secondary | ICD-10-CM | POA: Diagnosis not present

## 2019-11-29 DIAGNOSIS — I10 Essential (primary) hypertension: Secondary | ICD-10-CM | POA: Diagnosis not present

## 2019-11-29 DIAGNOSIS — M179 Osteoarthritis of knee, unspecified: Secondary | ICD-10-CM | POA: Diagnosis not present

## 2019-11-29 DIAGNOSIS — J45990 Exercise induced bronchospasm: Secondary | ICD-10-CM | POA: Diagnosis not present

## 2019-11-29 DIAGNOSIS — E119 Type 2 diabetes mellitus without complications: Secondary | ICD-10-CM | POA: Diagnosis not present

## 2019-11-29 DIAGNOSIS — J4520 Mild intermittent asthma, uncomplicated: Secondary | ICD-10-CM | POA: Diagnosis not present

## 2019-11-29 DIAGNOSIS — E039 Hypothyroidism, unspecified: Secondary | ICD-10-CM | POA: Diagnosis not present

## 2019-11-29 DIAGNOSIS — M1990 Unspecified osteoarthritis, unspecified site: Secondary | ICD-10-CM | POA: Diagnosis not present

## 2019-11-29 DIAGNOSIS — E139 Other specified diabetes mellitus without complications: Secondary | ICD-10-CM | POA: Diagnosis not present

## 2019-11-29 DIAGNOSIS — J449 Chronic obstructive pulmonary disease, unspecified: Secondary | ICD-10-CM | POA: Diagnosis not present

## 2019-12-23 DIAGNOSIS — H524 Presbyopia: Secondary | ICD-10-CM | POA: Diagnosis not present

## 2019-12-23 DIAGNOSIS — H25013 Cortical age-related cataract, bilateral: Secondary | ICD-10-CM | POA: Diagnosis not present

## 2019-12-23 DIAGNOSIS — E119 Type 2 diabetes mellitus without complications: Secondary | ICD-10-CM | POA: Diagnosis not present

## 2019-12-31 DIAGNOSIS — E119 Type 2 diabetes mellitus without complications: Secondary | ICD-10-CM | POA: Diagnosis not present

## 2020-01-27 DIAGNOSIS — E119 Type 2 diabetes mellitus without complications: Secondary | ICD-10-CM | POA: Diagnosis not present

## 2020-01-27 DIAGNOSIS — M545 Low back pain: Secondary | ICD-10-CM | POA: Diagnosis not present

## 2020-01-27 DIAGNOSIS — J440 Chronic obstructive pulmonary disease with acute lower respiratory infection: Secondary | ICD-10-CM | POA: Diagnosis not present

## 2020-01-27 DIAGNOSIS — E039 Hypothyroidism, unspecified: Secondary | ICD-10-CM | POA: Diagnosis not present

## 2020-01-27 DIAGNOSIS — Z720 Tobacco use: Secondary | ICD-10-CM | POA: Diagnosis not present

## 2020-01-27 DIAGNOSIS — K219 Gastro-esophageal reflux disease without esophagitis: Secondary | ICD-10-CM | POA: Diagnosis not present

## 2020-01-27 DIAGNOSIS — E559 Vitamin D deficiency, unspecified: Secondary | ICD-10-CM | POA: Diagnosis not present

## 2020-02-01 DIAGNOSIS — E119 Type 2 diabetes mellitus without complications: Secondary | ICD-10-CM | POA: Diagnosis not present

## 2020-02-02 DIAGNOSIS — J4520 Mild intermittent asthma, uncomplicated: Secondary | ICD-10-CM | POA: Diagnosis not present

## 2020-02-02 DIAGNOSIS — E139 Other specified diabetes mellitus without complications: Secondary | ICD-10-CM | POA: Diagnosis not present

## 2020-02-02 DIAGNOSIS — J449 Chronic obstructive pulmonary disease, unspecified: Secondary | ICD-10-CM | POA: Diagnosis not present

## 2020-02-02 DIAGNOSIS — M179 Osteoarthritis of knee, unspecified: Secondary | ICD-10-CM | POA: Diagnosis not present

## 2020-02-02 DIAGNOSIS — E119 Type 2 diabetes mellitus without complications: Secondary | ICD-10-CM | POA: Diagnosis not present

## 2020-02-02 DIAGNOSIS — I10 Essential (primary) hypertension: Secondary | ICD-10-CM | POA: Diagnosis not present

## 2020-02-02 DIAGNOSIS — J441 Chronic obstructive pulmonary disease with (acute) exacerbation: Secondary | ICD-10-CM | POA: Diagnosis not present

## 2020-02-02 DIAGNOSIS — M1990 Unspecified osteoarthritis, unspecified site: Secondary | ICD-10-CM | POA: Diagnosis not present

## 2020-02-02 DIAGNOSIS — E039 Hypothyroidism, unspecified: Secondary | ICD-10-CM | POA: Diagnosis not present

## 2020-02-02 DIAGNOSIS — J45990 Exercise induced bronchospasm: Secondary | ICD-10-CM | POA: Diagnosis not present

## 2020-02-15 DIAGNOSIS — J440 Chronic obstructive pulmonary disease with acute lower respiratory infection: Secondary | ICD-10-CM | POA: Diagnosis not present

## 2020-02-15 DIAGNOSIS — M179 Osteoarthritis of knee, unspecified: Secondary | ICD-10-CM | POA: Diagnosis not present

## 2020-02-15 DIAGNOSIS — J449 Chronic obstructive pulmonary disease, unspecified: Secondary | ICD-10-CM | POA: Diagnosis not present

## 2020-02-15 DIAGNOSIS — J45990 Exercise induced bronchospasm: Secondary | ICD-10-CM | POA: Diagnosis not present

## 2020-02-15 DIAGNOSIS — I10 Essential (primary) hypertension: Secondary | ICD-10-CM | POA: Diagnosis not present

## 2020-02-15 DIAGNOSIS — J4520 Mild intermittent asthma, uncomplicated: Secondary | ICD-10-CM | POA: Diagnosis not present

## 2020-02-15 DIAGNOSIS — E039 Hypothyroidism, unspecified: Secondary | ICD-10-CM | POA: Diagnosis not present

## 2020-02-15 DIAGNOSIS — E139 Other specified diabetes mellitus without complications: Secondary | ICD-10-CM | POA: Diagnosis not present

## 2020-02-15 DIAGNOSIS — J441 Chronic obstructive pulmonary disease with (acute) exacerbation: Secondary | ICD-10-CM | POA: Diagnosis not present

## 2020-02-28 DIAGNOSIS — Z96653 Presence of artificial knee joint, bilateral: Secondary | ICD-10-CM | POA: Diagnosis not present

## 2020-02-28 DIAGNOSIS — M25561 Pain in right knee: Secondary | ICD-10-CM | POA: Diagnosis not present

## 2020-02-28 DIAGNOSIS — S8000XA Contusion of unspecified knee, initial encounter: Secondary | ICD-10-CM | POA: Diagnosis not present

## 2020-02-28 DIAGNOSIS — M25562 Pain in left knee: Secondary | ICD-10-CM | POA: Diagnosis not present

## 2020-03-03 DIAGNOSIS — E119 Type 2 diabetes mellitus without complications: Secondary | ICD-10-CM | POA: Diagnosis not present

## 2020-03-04 DIAGNOSIS — E119 Type 2 diabetes mellitus without complications: Secondary | ICD-10-CM | POA: Diagnosis not present

## 2020-04-04 DIAGNOSIS — E119 Type 2 diabetes mellitus without complications: Secondary | ICD-10-CM | POA: Diagnosis not present

## 2020-07-25 ENCOUNTER — Other Ambulatory Visit (HOSPITAL_COMMUNITY): Payer: Self-pay | Admitting: Sports Medicine

## 2020-07-25 ENCOUNTER — Other Ambulatory Visit: Payer: Self-pay

## 2020-07-25 ENCOUNTER — Ambulatory Visit (HOSPITAL_COMMUNITY)
Admission: RE | Admit: 2020-07-25 | Discharge: 2020-07-25 | Disposition: A | Discharge status: Home / Self Care | Payer: Medicare HMO | Source: Ambulatory Visit | Attending: Sports Medicine | Admitting: Sports Medicine

## 2020-07-25 DIAGNOSIS — M79604 Pain in right leg: Secondary | ICD-10-CM | POA: Diagnosis not present

## 2020-07-25 DIAGNOSIS — M7989 Other specified soft tissue disorders: Secondary | ICD-10-CM | POA: Diagnosis not present

## 2020-07-25 NOTE — CV Procedure (Signed)
RLE venous duplex completed. Preliminary results called to [facility] at Dr. Ulice Brilliant office.  Results can be found under chart review under CV PROC. 07/25/2020 3:26 PM Posie Lillibridge RVT, RDMS

## 2020-08-02 ENCOUNTER — Other Ambulatory Visit: Payer: Self-pay | Admitting: Internal Medicine

## 2020-08-02 ENCOUNTER — Ambulatory Visit
Admission: RE | Admit: 2020-08-02 | Discharge: 2020-08-02 | Disposition: A | Discharge status: Home / Self Care | Payer: Medicare HMO | Source: Ambulatory Visit | Attending: Internal Medicine | Admitting: Internal Medicine

## 2020-08-02 DIAGNOSIS — R042 Hemoptysis: Secondary | ICD-10-CM

## 2020-08-09 DIAGNOSIS — M67961 Unspecified disorder of synovium and tendon, right lower leg: Secondary | ICD-10-CM | POA: Diagnosis not present

## 2020-08-14 DIAGNOSIS — M67961 Unspecified disorder of synovium and tendon, right lower leg: Secondary | ICD-10-CM | POA: Diagnosis not present

## 2020-08-18 DIAGNOSIS — M67961 Unspecified disorder of synovium and tendon, right lower leg: Secondary | ICD-10-CM | POA: Diagnosis not present

## 2020-08-28 DIAGNOSIS — E139 Other specified diabetes mellitus without complications: Secondary | ICD-10-CM | POA: Diagnosis not present

## 2020-08-28 DIAGNOSIS — J449 Chronic obstructive pulmonary disease, unspecified: Secondary | ICD-10-CM | POA: Diagnosis not present

## 2020-08-28 DIAGNOSIS — E039 Hypothyroidism, unspecified: Secondary | ICD-10-CM | POA: Diagnosis not present

## 2020-08-28 DIAGNOSIS — K219 Gastro-esophageal reflux disease without esophagitis: Secondary | ICD-10-CM | POA: Diagnosis not present

## 2020-08-28 DIAGNOSIS — E7800 Pure hypercholesterolemia, unspecified: Secondary | ICD-10-CM | POA: Diagnosis not present

## 2020-08-28 DIAGNOSIS — I10 Essential (primary) hypertension: Secondary | ICD-10-CM | POA: Diagnosis not present

## 2020-08-28 DIAGNOSIS — M179 Osteoarthritis of knee, unspecified: Secondary | ICD-10-CM | POA: Diagnosis not present

## 2020-08-28 DIAGNOSIS — E1169 Type 2 diabetes mellitus with other specified complication: Secondary | ICD-10-CM | POA: Diagnosis not present

## 2020-08-28 DIAGNOSIS — E782 Mixed hyperlipidemia: Secondary | ICD-10-CM | POA: Diagnosis not present

## 2020-08-31 DIAGNOSIS — F17210 Nicotine dependence, cigarettes, uncomplicated: Secondary | ICD-10-CM | POA: Diagnosis not present

## 2020-08-31 DIAGNOSIS — E039 Hypothyroidism, unspecified: Secondary | ICD-10-CM | POA: Diagnosis not present

## 2020-08-31 DIAGNOSIS — I700 Atherosclerosis of aorta: Secondary | ICD-10-CM | POA: Diagnosis not present

## 2020-08-31 DIAGNOSIS — I10 Essential (primary) hypertension: Secondary | ICD-10-CM | POA: Diagnosis not present

## 2020-08-31 DIAGNOSIS — Z1389 Encounter for screening for other disorder: Secondary | ICD-10-CM | POA: Diagnosis not present

## 2020-08-31 DIAGNOSIS — Z79899 Other long term (current) drug therapy: Secondary | ICD-10-CM | POA: Diagnosis not present

## 2020-08-31 DIAGNOSIS — Z0000 Encounter for general adult medical examination without abnormal findings: Secondary | ICD-10-CM | POA: Diagnosis not present

## 2020-08-31 DIAGNOSIS — E782 Mixed hyperlipidemia: Secondary | ICD-10-CM | POA: Diagnosis not present

## 2020-08-31 DIAGNOSIS — E7800 Pure hypercholesterolemia, unspecified: Secondary | ICD-10-CM | POA: Diagnosis not present

## 2020-08-31 DIAGNOSIS — J449 Chronic obstructive pulmonary disease, unspecified: Secondary | ICD-10-CM | POA: Diagnosis not present

## 2020-08-31 DIAGNOSIS — E1165 Type 2 diabetes mellitus with hyperglycemia: Secondary | ICD-10-CM | POA: Diagnosis not present

## 2020-08-31 DIAGNOSIS — E559 Vitamin D deficiency, unspecified: Secondary | ICD-10-CM | POA: Diagnosis not present

## 2020-08-31 DIAGNOSIS — Z7189 Other specified counseling: Secondary | ICD-10-CM | POA: Diagnosis not present

## 2020-09-01 ENCOUNTER — Other Ambulatory Visit: Payer: Self-pay | Admitting: Orthopedic Surgery

## 2020-09-01 ENCOUNTER — Other Ambulatory Visit (HOSPITAL_COMMUNITY): Payer: Self-pay | Admitting: Orthopedic Surgery

## 2020-09-01 DIAGNOSIS — Z96651 Presence of right artificial knee joint: Secondary | ICD-10-CM

## 2020-09-01 DIAGNOSIS — Z96653 Presence of artificial knee joint, bilateral: Secondary | ICD-10-CM | POA: Diagnosis not present

## 2020-09-03 DIAGNOSIS — E1169 Type 2 diabetes mellitus with other specified complication: Secondary | ICD-10-CM | POA: Diagnosis not present

## 2020-09-04 DIAGNOSIS — E1169 Type 2 diabetes mellitus with other specified complication: Secondary | ICD-10-CM | POA: Diagnosis not present

## 2020-09-12 DIAGNOSIS — M25571 Pain in right ankle and joints of right foot: Secondary | ICD-10-CM | POA: Diagnosis not present

## 2020-09-18 ENCOUNTER — Ambulatory Visit (HOSPITAL_COMMUNITY)
Admission: RE | Admit: 2020-09-18 | Discharge: 2020-09-18 | Disposition: A | Discharge status: Home / Self Care | Payer: Medicare HMO | Source: Ambulatory Visit | Attending: Orthopedic Surgery | Admitting: Orthopedic Surgery

## 2020-09-18 ENCOUNTER — Other Ambulatory Visit: Payer: Self-pay

## 2020-09-18 DIAGNOSIS — R296 Repeated falls: Secondary | ICD-10-CM | POA: Diagnosis not present

## 2020-09-18 DIAGNOSIS — Z96651 Presence of right artificial knee joint: Secondary | ICD-10-CM | POA: Insufficient documentation

## 2020-09-18 DIAGNOSIS — M25561 Pain in right knee: Secondary | ICD-10-CM | POA: Diagnosis not present

## 2020-09-18 MED ORDER — TECHNETIUM TC 99M MEDRONATE IV KIT
20.0000 | PACK | Freq: Once | INTRAVENOUS | Status: AC | PRN
  Administered 2020-09-18: 22 via INTRAVENOUS

## 2020-09-20 DIAGNOSIS — M19071 Primary osteoarthritis, right ankle and foot: Secondary | ICD-10-CM | POA: Diagnosis not present

## 2020-09-21 DIAGNOSIS — M179 Osteoarthritis of knee, unspecified: Secondary | ICD-10-CM | POA: Diagnosis not present

## 2020-09-21 DIAGNOSIS — E039 Hypothyroidism, unspecified: Secondary | ICD-10-CM | POA: Diagnosis not present

## 2020-09-21 DIAGNOSIS — E782 Mixed hyperlipidemia: Secondary | ICD-10-CM | POA: Diagnosis not present

## 2020-09-21 DIAGNOSIS — E7800 Pure hypercholesterolemia, unspecified: Secondary | ICD-10-CM | POA: Diagnosis not present

## 2020-09-21 DIAGNOSIS — J449 Chronic obstructive pulmonary disease, unspecified: Secondary | ICD-10-CM | POA: Diagnosis not present

## 2020-09-21 DIAGNOSIS — K219 Gastro-esophageal reflux disease without esophagitis: Secondary | ICD-10-CM | POA: Diagnosis not present

## 2020-09-21 DIAGNOSIS — I10 Essential (primary) hypertension: Secondary | ICD-10-CM | POA: Diagnosis not present

## 2020-09-21 DIAGNOSIS — E119 Type 2 diabetes mellitus without complications: Secondary | ICD-10-CM | POA: Diagnosis not present

## 2020-09-22 DIAGNOSIS — M25561 Pain in right knee: Secondary | ICD-10-CM | POA: Diagnosis not present

## 2020-10-02 DIAGNOSIS — E1169 Type 2 diabetes mellitus with other specified complication: Secondary | ICD-10-CM | POA: Diagnosis not present

## 2020-10-26 DIAGNOSIS — J440 Chronic obstructive pulmonary disease with acute lower respiratory infection: Secondary | ICD-10-CM | POA: Diagnosis not present

## 2020-10-26 DIAGNOSIS — E7800 Pure hypercholesterolemia, unspecified: Secondary | ICD-10-CM | POA: Diagnosis not present

## 2020-10-26 DIAGNOSIS — J449 Chronic obstructive pulmonary disease, unspecified: Secondary | ICD-10-CM | POA: Diagnosis not present

## 2020-10-26 DIAGNOSIS — I10 Essential (primary) hypertension: Secondary | ICD-10-CM | POA: Diagnosis not present

## 2020-10-26 DIAGNOSIS — J45990 Exercise induced bronchospasm: Secondary | ICD-10-CM | POA: Diagnosis not present

## 2020-10-26 DIAGNOSIS — E782 Mixed hyperlipidemia: Secondary | ICD-10-CM | POA: Diagnosis not present

## 2020-10-26 DIAGNOSIS — E1169 Type 2 diabetes mellitus with other specified complication: Secondary | ICD-10-CM | POA: Diagnosis not present

## 2020-10-26 DIAGNOSIS — E039 Hypothyroidism, unspecified: Secondary | ICD-10-CM | POA: Diagnosis not present

## 2020-10-26 DIAGNOSIS — J441 Chronic obstructive pulmonary disease with (acute) exacerbation: Secondary | ICD-10-CM | POA: Diagnosis not present

## 2020-11-02 DIAGNOSIS — E1169 Type 2 diabetes mellitus with other specified complication: Secondary | ICD-10-CM | POA: Diagnosis not present

## 2020-12-01 DIAGNOSIS — E1169 Type 2 diabetes mellitus with other specified complication: Secondary | ICD-10-CM | POA: Diagnosis not present

## 2020-12-19 DIAGNOSIS — J449 Chronic obstructive pulmonary disease, unspecified: Secondary | ICD-10-CM | POA: Diagnosis not present

## 2020-12-19 DIAGNOSIS — E1169 Type 2 diabetes mellitus with other specified complication: Secondary | ICD-10-CM | POA: Diagnosis not present

## 2020-12-19 DIAGNOSIS — E039 Hypothyroidism, unspecified: Secondary | ICD-10-CM | POA: Diagnosis not present

## 2020-12-19 DIAGNOSIS — J440 Chronic obstructive pulmonary disease with acute lower respiratory infection: Secondary | ICD-10-CM | POA: Diagnosis not present

## 2020-12-19 DIAGNOSIS — E139 Other specified diabetes mellitus without complications: Secondary | ICD-10-CM | POA: Diagnosis not present

## 2020-12-19 DIAGNOSIS — J441 Chronic obstructive pulmonary disease with (acute) exacerbation: Secondary | ICD-10-CM | POA: Diagnosis not present

## 2020-12-19 DIAGNOSIS — I10 Essential (primary) hypertension: Secondary | ICD-10-CM | POA: Diagnosis not present

## 2020-12-19 DIAGNOSIS — J45990 Exercise induced bronchospasm: Secondary | ICD-10-CM | POA: Diagnosis not present

## 2020-12-19 DIAGNOSIS — E782 Mixed hyperlipidemia: Secondary | ICD-10-CM | POA: Diagnosis not present

## 2021-01-02 DIAGNOSIS — Z720 Tobacco use: Secondary | ICD-10-CM | POA: Diagnosis not present

## 2021-01-02 DIAGNOSIS — E559 Vitamin D deficiency, unspecified: Secondary | ICD-10-CM | POA: Diagnosis not present

## 2021-01-02 DIAGNOSIS — E039 Hypothyroidism, unspecified: Secondary | ICD-10-CM | POA: Diagnosis not present

## 2021-01-02 DIAGNOSIS — E7800 Pure hypercholesterolemia, unspecified: Secondary | ICD-10-CM | POA: Diagnosis not present

## 2021-01-02 DIAGNOSIS — I700 Atherosclerosis of aorta: Secondary | ICD-10-CM | POA: Diagnosis not present

## 2021-01-02 DIAGNOSIS — E1169 Type 2 diabetes mellitus with other specified complication: Secondary | ICD-10-CM | POA: Diagnosis not present

## 2021-01-02 DIAGNOSIS — R3589 Other polyuria: Secondary | ICD-10-CM | POA: Diagnosis not present

## 2021-01-02 DIAGNOSIS — E1143 Type 2 diabetes mellitus with diabetic autonomic (poly)neuropathy: Secondary | ICD-10-CM | POA: Diagnosis not present

## 2021-01-02 DIAGNOSIS — M5431 Sciatica, right side: Secondary | ICD-10-CM | POA: Diagnosis not present

## 2021-01-02 DIAGNOSIS — F17210 Nicotine dependence, cigarettes, uncomplicated: Secondary | ICD-10-CM | POA: Diagnosis not present

## 2021-01-05 DIAGNOSIS — M545 Low back pain, unspecified: Secondary | ICD-10-CM | POA: Diagnosis not present

## 2021-01-29 DIAGNOSIS — M25561 Pain in right knee: Secondary | ICD-10-CM | POA: Diagnosis not present

## 2021-01-29 DIAGNOSIS — M5459 Other low back pain: Secondary | ICD-10-CM | POA: Diagnosis not present

## 2021-02-01 DIAGNOSIS — J4520 Mild intermittent asthma, uncomplicated: Secondary | ICD-10-CM | POA: Diagnosis not present

## 2021-02-01 DIAGNOSIS — M1990 Unspecified osteoarthritis, unspecified site: Secondary | ICD-10-CM | POA: Diagnosis not present

## 2021-02-01 DIAGNOSIS — E782 Mixed hyperlipidemia: Secondary | ICD-10-CM | POA: Diagnosis not present

## 2021-02-01 DIAGNOSIS — K219 Gastro-esophageal reflux disease without esophagitis: Secondary | ICD-10-CM | POA: Diagnosis not present

## 2021-02-01 DIAGNOSIS — I10 Essential (primary) hypertension: Secondary | ICD-10-CM | POA: Diagnosis not present

## 2021-02-01 DIAGNOSIS — E7800 Pure hypercholesterolemia, unspecified: Secondary | ICD-10-CM | POA: Diagnosis not present

## 2021-02-01 DIAGNOSIS — E1169 Type 2 diabetes mellitus with other specified complication: Secondary | ICD-10-CM | POA: Diagnosis not present

## 2021-02-01 DIAGNOSIS — E039 Hypothyroidism, unspecified: Secondary | ICD-10-CM | POA: Diagnosis not present

## 2021-02-01 DIAGNOSIS — J45990 Exercise induced bronchospasm: Secondary | ICD-10-CM | POA: Diagnosis not present

## 2021-02-01 DIAGNOSIS — J449 Chronic obstructive pulmonary disease, unspecified: Secondary | ICD-10-CM | POA: Diagnosis not present

## 2021-02-03 ENCOUNTER — Encounter (HOSPITAL_COMMUNITY): Payer: Self-pay | Admitting: *Deleted

## 2021-02-03 ENCOUNTER — Other Ambulatory Visit: Payer: Self-pay

## 2021-02-03 ENCOUNTER — Ambulatory Visit (HOSPITAL_COMMUNITY)
Admission: EM | Admit: 2021-02-03 | Discharge: 2021-02-03 | Disposition: A | Discharge status: Home / Self Care | Payer: Medicare HMO | Attending: Physician Assistant | Admitting: Physician Assistant

## 2021-02-03 DIAGNOSIS — S0502XA Injury of conjunctiva and corneal abrasion without foreign body, left eye, initial encounter: Secondary | ICD-10-CM

## 2021-02-03 MED ORDER — TETRACAINE HCL 0.5 % OP SOLN
OPHTHALMIC | Status: AC
  Filled 2021-02-03: qty 4

## 2021-02-03 MED ORDER — FLUORESCEIN SODIUM 1 MG OP STRP
ORAL_STRIP | OPHTHALMIC | Status: AC
  Filled 2021-02-03: qty 1

## 2021-02-03 MED ORDER — ERYTHROMYCIN 5 MG/GM OP OINT: TOPICAL_OINTMENT | OPHTHALMIC | 0 refills | Status: DC

## 2021-02-03 NOTE — ED Provider Notes (Signed)
[facility]    CSN: [id number] Arrival date & time: 02/03/21  1259      History   Chief Complaint Chief Complaint  Patient presents with   Eye Problem   Eye Injury    HPI Tina Neal is a 70 y.o. female.   Patient presents today with a several hour history of left eye photophobia and tenderness after injury.  Reports that she was laying in her bed when her dog ran up the stairs bounced on the bed and hit her in her left eye.  She reports since that time she has had some mild swelling as well as photophobia.  Denies any visual disturbance, ocular pain, nausea, vomiting.  Denies any pain with extraocular movements.  She has not applied any medications for symptom management.  She does not wear contacts or glasses; only uses reading glasses as needed.  She reports increased tearing from the eye but denies any erythema or significant drainage.   Past Medical History:  Diagnosis Date   Arthritis    Asthma    Diabetes mellitus without complication ([place])    GERD (gastroesophageal reflux disease)    Hyperlipidemia    Hypertension    Hypothyroidism    Pancreatitis    history    Patient Active Problem List   Diagnosis Date Noted   Calcific tendinitis of left shoulder 06/22/2012   Acromioclavicular arthrosis 06/22/2012   Postlaminectomy syndrome, lumbar region 01/28/2012   Disorders of sacrum 01/28/2012   Other chronic postoperative pain 01/28/2012   Pancreatitis 07/13/2011   Hypertension 07/13/2011   COPD (chronic obstructive pulmonary disease) ([place]) 07/13/2011    Past Surgical History:  Procedure Laterality Date   ABDOMINAL HYSTERECTOMY     BACK SURGERY  2011   lumbar fusion   CALCANEAL OSTEOTOMY Left 10/28/2013   Procedure: LEFT CALCANEAL OSTEOTOMY;  Surgeon: Wylene Simmer, MD;  Location: [facility];  Service: Orthopedics;  Laterality: Left;   CHOLECYSTECTOMY  2009   GASTROC RECESSION EXTREMITY Left 10/28/2013   Procedure: LEFT GASTROCNEMIUS  RECESSION ;  Surgeon: Wylene Simmer, MD;  Location: [place] [facility];  Service: Orthopedics;  Laterality: Left;   JOINT REPLACEMENT     bilat total knees   KNEE SURGERY     right   rotator cuff     rt   TENDON TRANSFER Left 10/28/2013   Procedure: LEFT FLEXOR DIGITORUM LONGUS TRANSFER, POSTERIOR TIBIAL TENOLYSIS;  Surgeon: Wylene Simmer, MD;  Location: [facility];  Service: Orthopedics;  Laterality: Left;   THYROIDECTOMY      OB History   No obstetric history on file.      Home Medications    Prior to Admission medications   Medication Sig Start Date End Date Taking? Authorizing Provider  erythromycin ophthalmic ointment Place a 1/2 inch ribbon of ointment into the lower eyelid. 02/03/21  Yes Donnamae Muilenburg K, PA-C  albuterol (PROVENTIL HFA;VENTOLIN HFA) 108 (90 BASE) MCG/ACT inhaler Inhale 2 puffs into the lungs 4 (four) times daily. 07/15/11 07/14/12  Dhungel, Flonnie Overman, MD  calcium citrate-vitamin D (CITRACAL+D) 315-200 MG-UNIT per tablet Take 1 tablet by mouth 2 (two) times daily.    [provider]  diclofenac sodium (VOLTAREN) 1 % GEL Apply 1 application topically 3 (three) times daily as needed. For pain     [provider]  Esomeprazole Magnesium (NEXIUM PO) Take 40 mg by mouth daily.     [provider]  fluticasone (FLONASE) 50 MCG/ACT nasal spray Place  1 spray into both nostrils daily. 10/03/18   Margarita Mail, PA-C  Levothyroxine Sodium (LEVOXYL PO) Take 75 mcg by mouth daily.     [provider]  meloxicam (MOBIC) 15 MG tablet Take 0.5-1 tablets (7.5-15 mg total) by mouth daily. 04/21/15   Waldemar Dickens, MD  methocarbamol (ROBAXIN) 500 MG tablet Take 1-2 tablets (500-1,000 mg total) by mouth every 6 (six) hours as needed for muscle spasms. 04/21/15   Waldemar Dickens, MD  Montelukast Sodium (SINGULAIR PO) Take 10 mg by mouth daily.     [provider]  spironolactone (ALDACTONE) 25 MG tablet Take 25 mg by mouth 2  (two) times daily.      [provider]  sulfamethoxazole-trimethoprim (BACTRIM DS,SEPTRA DS) 800-160 MG tablet Take 1 tablet by mouth 2 (two) times daily. 06/21/16   Edrick Kins, DPM    Family History Family History  Problem Relation Age of Onset   Asthma Mother    Cancer Father    Sleep apnea Other    Diabetes Other    Hypertension Other     Social History Social History   Tobacco Use   Smoking status: Every Day    Packs/day: 0.50    Pack years: 0.00    Types: Cigarettes   Smokeless tobacco: Never  Substance Use Topics   Alcohol use: No    Comment: occasional   Drug use: No     Allergies   Maxidex [dexamethasone] and Hctz [hydrochlorothiazide]   Review of Systems Review of Systems  Constitutional:  Negative for activity change, appetite change, fatigue and fever.  Eyes:  Positive for photophobia. Negative for pain, discharge, redness, itching and visual disturbance.  Respiratory:  Negative for cough and shortness of breath.   Cardiovascular:  Negative for chest pain.  Gastrointestinal:  Negative for abdominal pain, diarrhea, nausea and vomiting.  Musculoskeletal:  Negative for arthralgias and myalgias.  Neurological:  Negative for dizziness, light-headedness and headaches.    Physical Exam Triage Vital Signs ED Triage Vitals  Enc Vitals Group     BP 02/03/21 1407 (!) 145/81     Pulse Rate 02/03/21 1407 73     Resp 02/03/21 1407 16     Temp 02/03/21 1407 98.5 F (36.9 C)     Temp src --      SpO2 02/03/21 1407 99 %     Weight --      Height --      Head Circumference --      Peak Flow --      Pain Score 02/03/21 1404 8     Pain Loc --      Pain Edu? --      Excl. in [place]? --    No data found.  Updated Vital Signs BP (!) 145/81   Pulse 73   Temp 98.5 F (36.9 C)   Resp 16   SpO2 99%   Visual Acuity Right Eye Distance: 20/50 Left Eye Distance: 20/100 Bilateral Distance: 20/70  Right Eye Near:   Left Eye Near:    Bilateral  Near:     Physical Exam Vitals reviewed.  Constitutional:      General: She is awake. She is not in acute distress.    Appearance: Normal appearance. She is normal weight. She is not ill-appearing.     Comments: Very pleasant female appears stated age in no acute distress  HENT:     Head: Normocephalic and atraumatic.  Eyes:  Extraocular Movements: Extraocular movements intact.     Conjunctiva/sclera: Conjunctivae normal.     Right eye: Right conjunctiva is not injected.     Left eye: Left conjunctiva is not injected.     Pupils: Pupils are equal, round, and reactive to light.     Left eye: Corneal abrasion present.     Funduscopic exam:    Right eye: No hemorrhage.        Left eye: No hemorrhage.     Comments: Patient had resolution of pain with application of tetracaine  Cardiovascular:     Rate and Rhythm: Normal rate and regular rhythm.     Heart sounds: Normal heart sounds, S1 normal and S2 normal. No murmur heard. Pulmonary:     Effort: Pulmonary effort is normal.     Breath sounds: Normal breath sounds. No wheezing, rhonchi or rales.     Comments: Clear to auscultation bilaterally Psychiatric:        Behavior: Behavior is cooperative.     UC Treatments / Results  Labs (all labs ordered are listed, but only abnormal results are displayed) Labs Reviewed - No data to display  EKG   Radiology No results found.  Procedures Procedures (including critical care time)  Medications Ordered in UC Medications - No data to display  Initial Impression / Assessment and Plan / UC Course  I have reviewed the triage vital signs and the nursing notes.  Pertinent labs & imaging results that were available during my care of the patient were reviewed by me and considered in my medical decision making (see chart for details).      Corneal abrasion noted on exam.  Patient prescribed erythromycin ointment to help prevent infection and provide lubrication.  Patient can use  lubricating eyedrops for additional symptom relief.  Encouraged her to follow-up with ophthalmology.  She has an ophthalmologist and will call them first thing next week but she was also given contact information for on-call ophthalmologist in case she has difficulty being seen by her regular eye doctor.  Discussed alarm symptoms that warrant emergent evaluation.  Strict return precautions given to which patient expressed understanding.  Final Clinical Impressions(s) / UC Diagnoses   Final diagnoses:  Abrasion of left cornea, initial encounter     Discharge Instructions      Use erythromycin ointment in your eye to help with lubrication and prevent infection.  Do not touch the tip of bottle to your eye and wash hands prior to handling the medication in order for contamination.  Follow-up with eye doctor as we discussed.  If you have any worsening symptoms including changes to your vision you need to go to the emergency room.     ED Prescriptions     Medication Sig Dispense Auth. Provider   erythromycin ophthalmic ointment Place a 1/2 inch ribbon of ointment into the lower eyelid. 3.5 g Gesselle Fitzsimons K, PA-C      PDMP not reviewed this encounter.   Terrilee Croak, PA-C 02/03/21 1536

## 2021-02-03 NOTE — ED Triage Notes (Signed)
Pt reports her Daughters dog jumped on her this morning and hit her Lt eye

## 2021-02-03 NOTE — Discharge Instructions (Addendum)
Use erythromycin ointment in your eye to help with lubrication and prevent infection.  Do not touch the tip of bottle to your eye and wash hands prior to handling the medication in order for contamination.  Follow-up with eye doctor as we discussed.  If you have any worsening symptoms including changes to your vision you need to go to the emergency room.

## 2021-02-06 DIAGNOSIS — S0502XA Injury of conjunctiva and corneal abrasion without foreign body, left eye, initial encounter: Secondary | ICD-10-CM | POA: Diagnosis not present

## 2021-02-07 DIAGNOSIS — M25561 Pain in right knee: Secondary | ICD-10-CM | POA: Diagnosis not present

## 2021-02-07 DIAGNOSIS — M5459 Other low back pain: Secondary | ICD-10-CM | POA: Diagnosis not present

## 2021-02-13 DIAGNOSIS — J449 Chronic obstructive pulmonary disease, unspecified: Secondary | ICD-10-CM | POA: Diagnosis not present

## 2021-02-13 DIAGNOSIS — K219 Gastro-esophageal reflux disease without esophagitis: Secondary | ICD-10-CM | POA: Diagnosis not present

## 2021-02-13 DIAGNOSIS — E7800 Pure hypercholesterolemia, unspecified: Secondary | ICD-10-CM | POA: Diagnosis not present

## 2021-02-13 DIAGNOSIS — J45990 Exercise induced bronchospasm: Secondary | ICD-10-CM | POA: Diagnosis not present

## 2021-02-13 DIAGNOSIS — M1990 Unspecified osteoarthritis, unspecified site: Secondary | ICD-10-CM | POA: Diagnosis not present

## 2021-02-13 DIAGNOSIS — E039 Hypothyroidism, unspecified: Secondary | ICD-10-CM | POA: Diagnosis not present

## 2021-02-13 DIAGNOSIS — J4520 Mild intermittent asthma, uncomplicated: Secondary | ICD-10-CM | POA: Diagnosis not present

## 2021-02-13 DIAGNOSIS — I10 Essential (primary) hypertension: Secondary | ICD-10-CM | POA: Diagnosis not present

## 2021-02-13 DIAGNOSIS — E782 Mixed hyperlipidemia: Secondary | ICD-10-CM | POA: Diagnosis not present

## 2021-02-19 DIAGNOSIS — M5459 Other low back pain: Secondary | ICD-10-CM | POA: Diagnosis not present

## 2021-02-19 DIAGNOSIS — M25561 Pain in right knee: Secondary | ICD-10-CM | POA: Diagnosis not present

## 2021-03-02 DIAGNOSIS — E119 Type 2 diabetes mellitus without complications: Secondary | ICD-10-CM | POA: Diagnosis not present

## 2021-03-12 DIAGNOSIS — M5431 Sciatica, right side: Secondary | ICD-10-CM | POA: Diagnosis not present

## 2021-03-12 DIAGNOSIS — E559 Vitamin D deficiency, unspecified: Secondary | ICD-10-CM | POA: Diagnosis not present

## 2021-03-12 DIAGNOSIS — I700 Atherosclerosis of aorta: Secondary | ICD-10-CM | POA: Diagnosis not present

## 2021-03-12 DIAGNOSIS — E1143 Type 2 diabetes mellitus with diabetic autonomic (poly)neuropathy: Secondary | ICD-10-CM | POA: Diagnosis not present

## 2021-03-12 DIAGNOSIS — E1169 Type 2 diabetes mellitus with other specified complication: Secondary | ICD-10-CM | POA: Diagnosis not present

## 2021-03-12 DIAGNOSIS — Z720 Tobacco use: Secondary | ICD-10-CM | POA: Diagnosis not present

## 2021-03-12 DIAGNOSIS — E7800 Pure hypercholesterolemia, unspecified: Secondary | ICD-10-CM | POA: Diagnosis not present

## 2021-03-12 DIAGNOSIS — E039 Hypothyroidism, unspecified: Secondary | ICD-10-CM | POA: Diagnosis not present

## 2021-03-12 DIAGNOSIS — R3589 Other polyuria: Secondary | ICD-10-CM | POA: Diagnosis not present

## 2021-03-26 ENCOUNTER — Other Ambulatory Visit (HOSPITAL_COMMUNITY): Payer: Medicare HMO

## 2021-04-04 ENCOUNTER — Inpatient Hospital Stay: Admit: 2021-04-04 | Payer: Medicare HMO | Admitting: Orthopedic Surgery

## 2021-04-04 SURGERY — TOTAL KNEE REVISION
Anesthesia: Choice | Site: Knee | Laterality: Right

## 2021-05-02 DIAGNOSIS — E1143 Type 2 diabetes mellitus with diabetic autonomic (poly)neuropathy: Secondary | ICD-10-CM | POA: Diagnosis not present

## 2021-05-02 DIAGNOSIS — F17200 Nicotine dependence, unspecified, uncomplicated: Secondary | ICD-10-CM | POA: Diagnosis not present

## 2021-05-02 DIAGNOSIS — M1990 Unspecified osteoarthritis, unspecified site: Secondary | ICD-10-CM | POA: Diagnosis not present

## 2021-05-02 DIAGNOSIS — E1169 Type 2 diabetes mellitus with other specified complication: Secondary | ICD-10-CM | POA: Diagnosis not present

## 2021-05-02 DIAGNOSIS — M7582 Other shoulder lesions, left shoulder: Secondary | ICD-10-CM | POA: Diagnosis not present

## 2021-05-02 DIAGNOSIS — E1165 Type 2 diabetes mellitus with hyperglycemia: Secondary | ICD-10-CM | POA: Diagnosis not present

## 2021-05-18 DIAGNOSIS — H524 Presbyopia: Secondary | ICD-10-CM | POA: Diagnosis not present

## 2021-05-18 DIAGNOSIS — H5203 Hypermetropia, bilateral: Secondary | ICD-10-CM | POA: Diagnosis not present

## 2021-05-18 DIAGNOSIS — H2513 Age-related nuclear cataract, bilateral: Secondary | ICD-10-CM | POA: Diagnosis not present

## 2021-05-18 DIAGNOSIS — E119 Type 2 diabetes mellitus without complications: Secondary | ICD-10-CM | POA: Diagnosis not present

## 2021-05-30 DIAGNOSIS — M545 Low back pain, unspecified: Secondary | ICD-10-CM | POA: Diagnosis not present

## 2021-06-04 DIAGNOSIS — E1143 Type 2 diabetes mellitus with diabetic autonomic (poly)neuropathy: Secondary | ICD-10-CM | POA: Diagnosis not present

## 2021-06-14 DIAGNOSIS — Z1231 Encounter for screening mammogram for malignant neoplasm of breast: Secondary | ICD-10-CM | POA: Diagnosis not present

## 2021-06-20 DIAGNOSIS — Z981 Arthrodesis status: Secondary | ICD-10-CM | POA: Diagnosis not present

## 2021-06-20 DIAGNOSIS — M5136 Other intervertebral disc degeneration, lumbar region: Secondary | ICD-10-CM | POA: Diagnosis not present

## 2021-06-20 DIAGNOSIS — M4804 Spinal stenosis, thoracic region: Secondary | ICD-10-CM | POA: Diagnosis not present

## 2021-06-20 DIAGNOSIS — M48061 Spinal stenosis, lumbar region without neurogenic claudication: Secondary | ICD-10-CM | POA: Diagnosis not present

## 2021-06-20 DIAGNOSIS — M47817 Spondylosis without myelopathy or radiculopathy, lumbosacral region: Secondary | ICD-10-CM | POA: Diagnosis not present

## 2021-06-20 DIAGNOSIS — M4317 Spondylolisthesis, lumbosacral region: Secondary | ICD-10-CM | POA: Diagnosis not present

## 2021-06-20 DIAGNOSIS — M4807 Spinal stenosis, lumbosacral region: Secondary | ICD-10-CM | POA: Diagnosis not present

## 2021-06-20 DIAGNOSIS — M5134 Other intervertebral disc degeneration, thoracic region: Secondary | ICD-10-CM | POA: Diagnosis not present

## 2021-06-25 DIAGNOSIS — M5136 Other intervertebral disc degeneration, lumbar region: Secondary | ICD-10-CM | POA: Diagnosis not present

## 2021-06-25 DIAGNOSIS — M545 Low back pain, unspecified: Secondary | ICD-10-CM | POA: Diagnosis not present

## 2021-06-27 DIAGNOSIS — Z720 Tobacco use: Secondary | ICD-10-CM | POA: Diagnosis not present

## 2021-06-27 DIAGNOSIS — Z7984 Long term (current) use of oral hypoglycemic drugs: Secondary | ICD-10-CM | POA: Diagnosis not present

## 2021-06-27 DIAGNOSIS — Z23 Encounter for immunization: Secondary | ICD-10-CM | POA: Diagnosis not present

## 2021-06-27 DIAGNOSIS — M545 Low back pain, unspecified: Secondary | ICD-10-CM | POA: Diagnosis not present

## 2021-06-27 DIAGNOSIS — E1143 Type 2 diabetes mellitus with diabetic autonomic (poly)neuropathy: Secondary | ICD-10-CM | POA: Diagnosis not present

## 2021-06-27 DIAGNOSIS — M5431 Sciatica, right side: Secondary | ICD-10-CM | POA: Diagnosis not present

## 2021-06-27 DIAGNOSIS — F17200 Nicotine dependence, unspecified, uncomplicated: Secondary | ICD-10-CM | POA: Diagnosis not present

## 2021-07-02 DIAGNOSIS — H6121 Impacted cerumen, right ear: Secondary | ICD-10-CM | POA: Diagnosis not present

## 2021-07-02 DIAGNOSIS — I10 Essential (primary) hypertension: Secondary | ICD-10-CM | POA: Diagnosis not present

## 2021-07-04 DIAGNOSIS — E1143 Type 2 diabetes mellitus with diabetic autonomic (poly)neuropathy: Secondary | ICD-10-CM | POA: Diagnosis not present

## 2021-07-06 DIAGNOSIS — M25512 Pain in left shoulder: Secondary | ICD-10-CM | POA: Diagnosis not present

## 2021-07-27 DIAGNOSIS — Z03818 Encounter for observation for suspected exposure to other biological agents ruled out: Secondary | ICD-10-CM | POA: Diagnosis not present

## 2021-07-27 DIAGNOSIS — J1281 Pneumonia due to SARS-associated coronavirus: Secondary | ICD-10-CM | POA: Diagnosis not present

## 2021-07-27 DIAGNOSIS — R059 Cough, unspecified: Secondary | ICD-10-CM | POA: Diagnosis not present

## 2021-08-20 ENCOUNTER — Ambulatory Visit: Payer: Self-pay | Admitting: Orthopedic Surgery

## 2021-08-20 DIAGNOSIS — M5416 Radiculopathy, lumbar region: Secondary | ICD-10-CM | POA: Diagnosis not present

## 2021-08-20 DIAGNOSIS — M5451 Vertebrogenic low back pain: Secondary | ICD-10-CM | POA: Diagnosis not present

## 2021-08-20 NOTE — H&P (Deleted)
  The note originally documented on this encounter has been moved the the encounter in which it belongs.  

## 2021-08-20 NOTE — H&P (Signed)
Subjective:   CC: LBP, left anteriolateral leg (thigh) pain XLIF L2-4 [place]E 08/29/21- 08/30/21  Patient Active Problem List   Diagnosis Date Noted   Calcific tendinitis of left shoulder 06/22/2012   Acromioclavicular arthrosis 06/22/2012   Postlaminectomy syndrome, lumbar region 01/28/2012   Disorders of sacrum 01/28/2012   Other chronic postoperative pain 01/28/2012   Pancreatitis 07/13/2011   Hypertension 07/13/2011   COPD (chronic obstructive pulmonary disease) ([place]) 07/13/2011   Past Medical History:  Diagnosis Date   Arthritis    Asthma    Diabetes mellitus without complication (HCC)    GERD (gastroesophageal reflux disease)    Hyperlipidemia    Hypertension    Hypothyroidism    Pancreatitis    history    Past Surgical History:  Procedure Laterality Date   ABDOMINAL HYSTERECTOMY     BACK SURGERY  2011   lumbar fusion   CALCANEAL OSTEOTOMY Left 10/28/2013   Procedure: LEFT CALCANEAL OSTEOTOMY;  Surgeon: Wylene Simmer, MD;  Location: [facility];  Service: Orthopedics;  Laterality: Left;   CHOLECYSTECTOMY  2009   GASTROC RECESSION EXTREMITY Left 10/28/2013   Procedure: LEFT GASTROCNEMIUS RECESSION ;  Surgeon: Wylene Simmer, MD;  Location: [facility];  Service: Orthopedics;  Laterality: Left;   JOINT REPLACEMENT     bilat total knees   KNEE SURGERY     right   rotator cuff     rt   TENDON TRANSFER Left 10/28/2013   Procedure: LEFT FLEXOR DIGITORUM LONGUS TRANSFER, POSTERIOR TIBIAL TENOLYSIS;  Surgeon: Wylene Simmer, MD;  Location: [facility];  Service: Orthopedics;  Laterality: Left;   THYROIDECTOMY      Current Outpatient Medications  Medication Sig Dispense Refill Last Dose   albuterol (PROVENTIL HFA;VENTOLIN HFA) 108 (90 BASE) MCG/ACT inhaler Inhale 2 puffs into the lungs 4 (four) times daily. 6.7 g 2    calcium citrate-vitamin D (CITRACAL+D) 315-200 MG-UNIT per tablet Take 1 tablet by mouth 2 (two) times daily.       diclofenac sodium (VOLTAREN) 1 % GEL Apply 1 application topically 3 (three) times daily as needed. For pain       erythromycin ophthalmic ointment Place a 1/2 inch ribbon of ointment into the lower eyelid. 3.5 g 0    Esomeprazole Magnesium (NEXIUM PO) Take 40 mg by mouth daily.       fluticasone (FLONASE) 50 MCG/ACT nasal spray Place 1 spray into both nostrils daily. 9.9 g 2    Levothyroxine Sodium (LEVOXYL PO) Take 75 mcg by mouth daily.       meloxicam (MOBIC) 15 MG tablet Take 0.5-1 tablets (7.5-15 mg total) by mouth daily. 30 tablet 0    methocarbamol (ROBAXIN) 500 MG tablet Take 1-2 tablets (500-1,000 mg total) by mouth every 6 (six) hours as needed for muscle spasms. 60 tablet 0    Montelukast Sodium (SINGULAIR PO) Take 10 mg by mouth daily.       spironolactone (ALDACTONE) 25 MG tablet Take 25 mg by mouth 2 (two) times daily.        sulfamethoxazole-trimethoprim (BACTRIM DS,SEPTRA DS) 800-160 MG tablet Take 1 tablet by mouth 2 (two) times daily. 20 tablet 0    No current facility-administered medications for this visit.   Allergies  Allergen Reactions   Maxidex [Dexamethasone] Photosensitivity   Hctz [Hydrochlorothiazide] Rash    Social History   Tobacco Use   Smoking status: Every Day    Packs/day: 0.50    Types: Cigarettes   Smokeless  tobacco: Never  Substance Use Topics   Alcohol use: No    Comment: occasional    Family History  Problem Relation Age of Onset   Asthma Mother    Cancer Father    Sleep apnea Other    Diabetes Other    Hypertension Other     Review of Systems Pertinent items are noted in HPI.  Objective:   Vitals: Ht: 5 ft 1.5 in 08/20/2021 10:53 am BP: 110/70 08/20/2021 11:00 am Pulse: 78 bpm 08/20/2021 11:00 am  Clinical exam: Tina Neal is a pleasant individual, who appears younger than their stated age. She is alert and orientated 3. No shortness of breath, chest pain.  Heart: RRR, no rubs, murmers, or gallops  Lungs: CTAB  Abdomen is soft  and non-tender, negative loss of bowel and bladder control, no rebound tenderness. BSx4  Negative: skin lesions abrasions contusions Peripheral pulses: 2+ dorsalis pedis/posterior tibialis pulses. Compartment soft and nontender . Gait pattern: Right knee osteoarthritis affecting her gait pattern. Assistive devices: None Neuro: 5/5 motor strength in the lower extremity bilaterally. Intact sensation throughout the lower extremity; positive dysesthesias in the left L3 dermatome. negative Babinski test, negative straight leg raise test. 1+ deep tendon reflexes at the knee and absent at the Achilles.  Tina Neal continues to have severe back pain, buttock pain, and proximal and lateral thigh pain. There is no pain, numbness or dysesthesias below the level of the knee. She has had a prior L4-5 fusion several years ago and has done well until recently.  Lumbar MRI: completed on 06/20/2021 was reviewed with the patient. It was completed at [facility]; I have independently reviewed the images as well as the radiology report. No significant stenosis L1/2. Severe degenerative disc disease L2-3 and L3-4. Mild left foraminal stenosis L2-3 but significant central stenosis is noted. Moderate right foraminal stenosis at L3-4 but moderate to severe central stenosis is noted. Prior fusion at L4-5 with no significant stenosis. There is significant facet arthropathy and a low-grade anterior listhesis at L5-S1 but no significant foraminal or central stenosis.  Assessment:   Unfortunately patient has significant adjacent segment degenerative disease L2-4 with significant back buttock and proximal neuropathic left leg pain predominantly in the L3 dermatome. Based on imaging studies I believe she has adjacent segment degenerative disease L2-3 and L3-4. While there is disease at L5-S1 the disc space is still maintained and she does not have a significant anterior listhesis. The patient has requested to move forward with  surgery.  At this point I think the best option is to address the acute problem which is the L2-4 adjacent segment degeneration. This can be accomplished with a lateral interbody fusion (XLIF) L2-4 with supplemental posterior pedicle screw fixation L2-5. I would extend the posterior instrumentation up to the L2 level. This will be done as a 2-day surgery. We have had a long discussion about how the L5-S1 level can become symptomatic and require additional surgery but at this point I do not think it is the major source of her current pain level.  Plan:   OLIF/XLIF risks, benefits of surgery were reviewed with the patient. These include: infection, bleeding, death, stroke, paralysis, ongoing or worse pain, need for additional surgery, injury to the lumbar plexus resulting in hip flexor weakness and difficulty walking without assistive devices. Adjacent segment degenerative disease, need for additional surgery including fusing other levels, leak of spinal fluid, Nonunion, hardware failure, breakage, or mal-position. Deep venous thrombosis (DVT) requiring additional treatment such as filter,  and/or medications. Injury to abdominal contents, loss in bowel and bladder control.  Risks and benefits of spinal fusion: Infection, bleeding, death, stroke, paralysis, ongoing or worse pain, need for additional surgery, nonunion, leak of spinal fluid, adjacent segment degeneration requiring additional fusion surgery, Injury to abdominal vessels that can require anterior surgery to stop bleeding. Malposition of the cage and/or pedicle screws that could require additional surgery. Loss of bowel and bladder control. Postoperative hematoma causing neurologic compression that could require urgent or emergent re-operation.  We have obtained preoperative medical clearance from the patient's primary care provider.  She was given an LSO brace at today's office visit.  I reviewed her medication list with her. She will hold her  aspirin and vitamins 5 days prior to surgery. We will plan to restart her aspirin 24-48 hours postop.  We have also discussed the post-operative recovery period to include: bathing/showering restrictions, wound healing, activity (and driving) restrictions, medications/pain mangement.  We have also discussed post-operative redflags to include: signs and symptoms of postoperative infection, DVT/PE.  Discharge instructions discussed with the patient today. She was given a copy of her discharge instructions. All questions were invited and answered  Follow-up: 2 weeks postop.

## 2021-08-22 DIAGNOSIS — R69 Illness, unspecified: Secondary | ICD-10-CM | POA: Diagnosis not present

## 2021-08-23 DIAGNOSIS — R69 Illness, unspecified: Secondary | ICD-10-CM | POA: Diagnosis not present

## 2021-08-24 ENCOUNTER — Ambulatory Visit: Payer: Self-pay | Admitting: Orthopedic Surgery

## 2021-08-24 DIAGNOSIS — Z01818 Encounter for other preprocedural examination: Secondary | ICD-10-CM

## 2021-08-24 NOTE — Pre-Procedure Instructions (Signed)
Surgical Instructions    Your procedure is scheduled on Wednesday, January 25th.  Report to [facility] Main Entrance "A" at 5:30 A.M., then check in with the Admitting office.  Call this number if you have problems the morning of surgery:  [phone]   If you have any questions prior to your surgery date call [phone]: Open Monday-Friday 8am-4pm    Remember:  Do not eat after midnight the night before your surgery  You may drink clear liquids until 4:30 a.m. the morning of your surgery.   Clear liquids allowed are: Water, Non-Citrus Juices (without pulp), Carbonated Beverages, Clear Tea, Black Coffee Only (NO MILK, CREAM OR POWDERED CREAMER of any kind), and Gatorade.    Take these medicines the morning of surgery with A SIP OF WATER  atorvastatin (LIPITOR) budesonide-formoterol (SYMBICORT)  gabapentin (NEURONTIN)  levothyroxine (SYNTHROID)  montelukast (SINGULAIR)  pantoprazole (PROTONIX)   Follow your surgeon's instructions on when to stop Aspirin.  If no instructions were given by your surgeon then you will need to call the office to get those instructions.    As of today, STOP taking any Aleve, Naproxen, Ibuprofen, Motrin, Advil, Goody's, BC's, all herbal medications, fish oil, and all vitamins. This includes: meloxicam (MOBIC)  and diclofenac sodium (VOLTAREN) 1 % GEL.          WHAT DO I DO ABOUT MY DIABETES MEDICATION?   Do not take oral metFORMIN (GLUCOPHAGE) the morning of surgery.   HOW TO MANAGE YOUR DIABETES BEFORE AND AFTER SURGERY  Why is it important to control my blood sugar before and after surgery? Improving blood sugar levels before and after surgery helps healing and can limit problems. A way of improving blood sugar control is eating a healthy diet by:  Eating less sugar and carbohydrates  Increasing activity/exercise  Talking with your doctor about reaching your blood sugar goals High blood sugars (greater than 180 mg/dL) can raise your risk of  infections and slow your recovery, so you will need to focus on controlling your diabetes during the weeks before surgery. Make sure that the doctor who takes care of your diabetes knows about your planned surgery including the date and location.  How do I manage my blood sugar before surgery? Check your blood sugar at least 4 times a day, starting 2 days before surgery, to make sure that the level is not too high or low.  Check your blood sugar the morning of your surgery when you wake up and every 2 hours until you get to the Short Stay unit.  If your blood sugar is less than 70 mg/dL, you will need to treat for low blood sugar: Do not take insulin. Treat a low blood sugar (less than 70 mg/dL) with  cup of clear juice (cranberry or apple), 4 glucose tablets, OR glucose gel. Recheck blood sugar in 15 minutes after treatment (to make sure it is greater than 70 mg/dL). If your blood sugar is not greater than 70 mg/dL on recheck, call [phone] for further instructions. Report your blood sugar to the short stay nurse when you get to Short Stay.  If you are admitted to the hospital after surgery: Your blood sugar will be checked by the staff and you will probably be given insulin after surgery (instead of oral diabetes medicines) to make sure you have good blood sugar levels. The goal for blood sugar control after surgery is 80-180 mg/dL.             Do  NOT Smoke (Tobacco/Vaping) for 24 hours prior to your procedure.  If you use a CPAP at night, you may bring all equipment for your overnight stay.   Contacts, glasses, piercing's, hearing aid's, dentures or partials may not be worn into surgery, please bring cases for these belongings.    For patients admitted to the hospital, discharge time will be determined by your treatment team.   Patients discharged the day of surgery will not be allowed to drive home, and someone needs to stay with them for 24 hours.  NO VISITORS WILL BE ALLOWED  IN PRE-OP WHERE PATIENTS ARE PREPPED FOR SURGERY.  ONLY 1 SUPPORT PERSON MAY BE PRESENT IN THE WAITING ROOM WHILE YOU ARE IN SURGERY.  IF YOU ARE TO BE ADMITTED, ONCE YOU ARE IN YOUR ROOM YOU WILL BE ALLOWED TWO (2) VISITORS. (1) VISITOR MAY STAY OVERNIGHT BUT MUST ARRIVE TO THE ROOM BY 8pm.  Minor children may have two parents present. Special consideration for safety and communication needs will be reviewed on a case by case basis.   Special instructions:   [place]- Preparing For Surgery  Before surgery, you can play an important role. Because skin is not sterile, your skin needs to be as free of germs as possible. You can reduce the number of germs on your skin by washing with CHG (chlorahexidine gluconate) Soap before surgery.  CHG is an antiseptic cleaner which kills germs and bonds with the skin to continue killing germs even after washing.    Oral Hygiene is also important to reduce your risk of infection.  Remember - BRUSH YOUR TEETH THE MORNING OF SURGERY WITH YOUR REGULAR TOOTHPASTE  Please do not use if you have an allergy to CHG or antibacterial soaps. If your skin becomes reddened/irritated stop using the CHG.  Do not shave (including legs and underarms) for at least 48 hours prior to first CHG shower. It is OK to shave your face.  Please follow these instructions carefully.   Shower the NIGHT BEFORE SURGERY and the MORNING OF SURGERY  If you chose to wash your hair, wash your hair first as usual with your normal shampoo.  After you shampoo, rinse your hair and body thoroughly to remove the shampoo.  Use CHG Soap as you would any other liquid soap. You can apply CHG directly to the skin and wash gently with a scrungie or a clean washcloth.   Apply the CHG Soap to your body ONLY FROM THE NECK DOWN.  Do not use on open wounds or open sores. Avoid contact with your eyes, ears, mouth and genitals (private parts). Wash Face and genitals (private parts)  with your normal soap.    Wash thoroughly, paying special attention to the area where your surgery will be performed.  Thoroughly rinse your body with warm water from the neck down.  DO NOT shower/wash with your normal soap after using and rinsing off the CHG Soap.  Pat yourself dry with a CLEAN TOWEL.  Wear CLEAN PAJAMAS to bed the night before surgery  Place CLEAN SHEETS on your bed the night before your surgery  DO NOT SLEEP WITH PETS.   Day of Surgery: Shower with CHG soap. Do not wear jewelry, make up, nail polish, gel polish, artificial nails, or any other type of covering on natural nails including finger and toenails. If patients have artificial nails, gel coating, etc. that need to be removed by a nail salon please have this removed prior to surgery. Surgery may  need to be canceled/delayed if the surgeon/anesthesiologist feels like the patient is unable to be adequately monitored. Do not wear lotions, powders, perfumes, or deodorant. Do not shave 48 hours prior to surgery.  Do not bring valuables to the hospital. [facility] is not responsible for any belongings or valuables. Wear Clean/Comfortable clothing the morning of surgery Remember to brush your teeth WITH YOUR REGULAR TOOTHPASTE.   Please read over the following fact sheets that you were given.   3 days prior to your procedure or After your COVID test   You are not required to quarantine however you are required to wear a well-fitting mask when you are out and around people not in your household. If your mask becomes wet or soiled, replace with a new one.   Wash your hands often with soap and water for 20 seconds or clean your hands with an alcohol-based hand sanitizer that contains at least 60% alcohol.   Do not share personal items.   Notify your provider:  o if you are in close contact with someone who has COVID  o or if you develop a fever of 100.4 or greater, sneezing, cough, sore throat, shortness of breath or body  aches.

## 2021-08-27 ENCOUNTER — Encounter (HOSPITAL_COMMUNITY): Payer: Self-pay

## 2021-08-27 ENCOUNTER — Encounter (HOSPITAL_COMMUNITY)
Admission: RE | Admit: 2021-08-27 | Discharge: 2021-08-27 | Disposition: A | Discharge status: Home / Self Care | Payer: Medicare HMO | Source: Ambulatory Visit | Attending: Orthopedic Surgery | Admitting: Orthopedic Surgery

## 2021-08-27 ENCOUNTER — Other Ambulatory Visit: Payer: Self-pay

## 2021-08-27 VITALS — BP 134/93 | HR 68 | Temp 98.2°F | Resp 18 | Ht 62.0 in | Wt 186.1 lb

## 2021-08-27 DIAGNOSIS — E039 Hypothyroidism, unspecified: Secondary | ICD-10-CM | POA: Diagnosis present

## 2021-08-27 DIAGNOSIS — Z888 Allergy status to other drugs, medicaments and biological substances status: Secondary | ICD-10-CM | POA: Diagnosis not present

## 2021-08-27 DIAGNOSIS — K219 Gastro-esophageal reflux disease without esophagitis: Secondary | ICD-10-CM | POA: Diagnosis present

## 2021-08-27 DIAGNOSIS — M549 Dorsalgia, unspecified: Secondary | ICD-10-CM | POA: Diagnosis present

## 2021-08-27 DIAGNOSIS — M4186 Other forms of scoliosis, lumbar region: Secondary | ICD-10-CM | POA: Diagnosis not present

## 2021-08-27 DIAGNOSIS — Z833 Family history of diabetes mellitus: Secondary | ICD-10-CM | POA: Diagnosis not present

## 2021-08-27 DIAGNOSIS — M4326 Fusion of spine, lumbar region: Secondary | ICD-10-CM | POA: Diagnosis not present

## 2021-08-27 DIAGNOSIS — Z01818 Encounter for other preprocedural examination: Secondary | ICD-10-CM

## 2021-08-27 DIAGNOSIS — G8918 Other acute postprocedural pain: Secondary | ICD-10-CM | POA: Diagnosis not present

## 2021-08-27 DIAGNOSIS — M5136 Other intervertebral disc degeneration, lumbar region: Secondary | ICD-10-CM | POA: Diagnosis not present

## 2021-08-27 DIAGNOSIS — Z90710 Acquired absence of both cervix and uterus: Secondary | ICD-10-CM | POA: Diagnosis not present

## 2021-08-27 DIAGNOSIS — Z791 Long term (current) use of non-steroidal anti-inflammatories (NSAID): Secondary | ICD-10-CM | POA: Diagnosis not present

## 2021-08-27 DIAGNOSIS — Z79890 Hormone replacement therapy (postmenopausal): Secondary | ICD-10-CM | POA: Diagnosis not present

## 2021-08-27 DIAGNOSIS — E785 Hyperlipidemia, unspecified: Secondary | ICD-10-CM | POA: Diagnosis present

## 2021-08-27 DIAGNOSIS — Z981 Arthrodesis status: Secondary | ICD-10-CM | POA: Diagnosis not present

## 2021-08-27 DIAGNOSIS — M48061 Spinal stenosis, lumbar region without neurogenic claudication: Secondary | ICD-10-CM | POA: Diagnosis present

## 2021-08-27 DIAGNOSIS — R94131 Abnormal electromyogram [EMG]: Secondary | ICD-10-CM | POA: Diagnosis not present

## 2021-08-27 DIAGNOSIS — Z8249 Family history of ischemic heart disease and other diseases of the circulatory system: Secondary | ICD-10-CM | POA: Diagnosis not present

## 2021-08-27 DIAGNOSIS — M5416 Radiculopathy, lumbar region: Secondary | ICD-10-CM | POA: Diagnosis not present

## 2021-08-27 DIAGNOSIS — M4316 Spondylolisthesis, lumbar region: Secondary | ICD-10-CM | POA: Diagnosis not present

## 2021-08-27 DIAGNOSIS — E119 Type 2 diabetes mellitus without complications: Secondary | ICD-10-CM

## 2021-08-27 DIAGNOSIS — M4150 Other secondary scoliosis, site unspecified: Secondary | ICD-10-CM | POA: Diagnosis present

## 2021-08-27 DIAGNOSIS — Z825 Family history of asthma and other chronic lower respiratory diseases: Secondary | ICD-10-CM | POA: Diagnosis not present

## 2021-08-27 DIAGNOSIS — Z79899 Other long term (current) drug therapy: Secondary | ICD-10-CM | POA: Diagnosis not present

## 2021-08-27 DIAGNOSIS — F17210 Nicotine dependence, cigarettes, uncomplicated: Secondary | ICD-10-CM | POA: Diagnosis present

## 2021-08-27 DIAGNOSIS — Z20822 Contact with and (suspected) exposure to covid-19: Secondary | ICD-10-CM | POA: Insufficient documentation

## 2021-08-27 DIAGNOSIS — M5116 Intervertebral disc disorders with radiculopathy, lumbar region: Secondary | ICD-10-CM | POA: Diagnosis present

## 2021-08-27 DIAGNOSIS — J449 Chronic obstructive pulmonary disease, unspecified: Secondary | ICD-10-CM | POA: Diagnosis present

## 2021-08-27 DIAGNOSIS — I10 Essential (primary) hypertension: Secondary | ICD-10-CM | POA: Diagnosis present

## 2021-08-27 DIAGNOSIS — M1990 Unspecified osteoarthritis, unspecified site: Secondary | ICD-10-CM | POA: Diagnosis present

## 2021-08-27 LAB — SURGICAL PCR SCREEN
MRSA, PCR: NEGATIVE
Staphylococcus aureus: NEGATIVE

## 2021-08-27 LAB — BASIC METABOLIC PANEL
Anion gap: 11 (ref 5–15)
BUN: 10 mg/dL (ref 8–23)
CO2: 27 mmol/L (ref 22–32)
Calcium: 8.9 mg/dL (ref 8.9–10.3)
Chloride: 103 mmol/L (ref 98–111)
Creatinine, Ser: 0.82 mg/dL (ref 0.44–1.00)
GFR, Estimated: >60 mL/min (ref >60)
Glucose, Bld: 99 mg/dL (ref 70–99)
Potassium: 3.8 mmol/L (ref 3.5–5.1)
Sodium: 141 mmol/L (ref 135–145)

## 2021-08-27 LAB — URINALYSIS, ROUTINE W REFLEX MICROSCOPIC
Bilirubin Urine: NEGATIVE
Glucose, UA: NEGATIVE mg/dL
Hgb urine dipstick: NEGATIVE
Ketones, ur: NEGATIVE mg/dL
Leukocytes,Ua: NEGATIVE
Nitrite: NEGATIVE
Protein, ur: NEGATIVE mg/dL
Specific Gravity, Urine: 1.009 (ref 1.005–1.030)
pH: 6 (ref 5.0–8.0)

## 2021-08-27 LAB — SARS CORONAVIRUS 2 (TAT 6-24 HRS): SARS Coronavirus 2: NEGATIVE

## 2021-08-27 LAB — CBC
HCT: 37.8 % (ref 36.0–46.0)
Hemoglobin: 12.2 g/dL (ref 12.0–15.0)
MCH: 27.7 pg (ref 26.0–34.0)
MCHC: 32.3 g/dL (ref 30.0–36.0)
MCV: 85.7 fL (ref 80.0–100.0)
Platelets: 226 10*3/uL (ref 150–400)
RBC: 4.41 MIL/uL (ref 3.87–5.11)
RDW: 15.1 % (ref 11.5–15.5)
WBC: 7.5 10*3/uL (ref 4.0–10.5)
nRBC: 0 % (ref 0.0–0.2)

## 2021-08-27 LAB — GLUCOSE, CAPILLARY: Glucose-Capillary: 90 mg/dL (ref 70–99)

## 2021-08-27 NOTE — Progress Notes (Signed)
PCP - Dr. Josetta Huddle Cardiologist - denies  PPM/ICD - n/a  Chest x-ray - n/a EKG - 08/27/21 Stress Test - 20+ years ago-reported normal ECHO - denies Cardiac Cath - denies  Sleep Study - denies CPAP - denies  Fasting Blood Sugar - 90-120 Checks Blood Sugar 1 time a day CBG at PAT: 90. Will collect A1C today.  Blood Thinner Instructions: n/a Aspirin Instructions: n/a  ERAS Protcol -Clear liquids until 0430 DOS PRE-SURGERY Ensure or G2- none ordered  COVID TEST- 08/27/21; done in PAT  Anesthesia review: Yes, per order  Patient denies shortness of breath, fever, cough and chest pain at PAT appointment   All instructions explained to the patient, with a verbal understanding of the material. Patient agrees to go over the instructions while at home for a better understanding. Patient also instructed to self quarantine after being tested for COVID-19. The opportunity to ask questions was provided.

## 2021-08-28 LAB — HEMOGLOBIN A1C
Hgb A1c MFr Bld: 6.3 % — ABNORMAL HIGH (ref 4.8–5.6)
Mean Plasma Glucose: 134 mg/dL

## 2021-08-28 NOTE — Anesthesia Preprocedure Evaluation (Addendum)
Anesthesia Evaluation  Patient identified by MRN, date of birth, ID band Patient awake    Reviewed: Allergy & Precautions, NPO status , Patient's Chart, lab work & pertinent test results  History of Anesthesia Complications Negative for: history of anesthetic complications  Airway Mallampati: I  TM Distance: >3 FB Neck ROM: Full    Dental  (+) Edentulous Upper, Partial Lower   Pulmonary COPD, Current Smoker and Patient abstained from smoking.,    Pulmonary exam normal        Cardiovascular hypertension, Pt. on medications Normal cardiovascular exam     Neuro/Psych negative neurological ROS     GI/Hepatic Neg liver ROS, GERD  ,  Endo/Other  diabetesHypothyroidism   Renal/GU negative Renal ROS     Musculoskeletal negative musculoskeletal ROS (+)   Abdominal   Peds  Hematology negative hematology ROS (+)   Anesthesia Other Findings   Reproductive/Obstetrics                            Anesthesia Physical Anesthesia Plan  ASA: 3  Anesthesia Plan: General   Post-op Pain Management: Regional block and Tylenol PO (pre-op)   Induction: Intravenous  PONV Risk Score and Plan: 3 and Ondansetron, Dexamethasone and Midazolam  Airway Management Planned: Oral ETT  Additional Equipment:   Intra-op Plan:   Post-operative Plan: Extubation in OR  Informed Consent: I have reviewed the patients History and Physical, chart, labs and discussed the procedure including the risks, benefits and alternatives for the proposed anesthesia with the patient or authorized representative who has indicated his/her understanding and acceptance.     Dental advisory given  Plan Discussed with: Anesthesiologist and CRNA  Anesthesia Plan Comments:        Anesthesia Quick Evaluation

## 2021-08-29 ENCOUNTER — Inpatient Hospital Stay (HOSPITAL_COMMUNITY): Payer: Medicare HMO | Admitting: Anesthesiology

## 2021-08-29 ENCOUNTER — Other Ambulatory Visit: Payer: Self-pay

## 2021-08-29 ENCOUNTER — Encounter (HOSPITAL_COMMUNITY): Payer: Self-pay | Admitting: Orthopedic Surgery

## 2021-08-29 ENCOUNTER — Inpatient Hospital Stay (HOSPITAL_COMMUNITY): Payer: Medicare HMO | Admitting: Physician Assistant

## 2021-08-29 ENCOUNTER — Inpatient Hospital Stay (HOSPITAL_COMMUNITY): Payer: Medicare HMO

## 2021-08-29 ENCOUNTER — Encounter (HOSPITAL_COMMUNITY)
Admission: RE | Disposition: A | Discharge status: Home / Self Care | Payer: Self-pay | Source: Home / Self Care | Attending: Orthopedic Surgery

## 2021-08-29 ENCOUNTER — Inpatient Hospital Stay (HOSPITAL_COMMUNITY)
Admission: RE | Admit: 2021-08-29 | Discharge: 2021-08-31 | DRG: 458 | Disposition: A | Discharge status: Home / Self Care | Payer: Medicare HMO | Attending: Orthopedic Surgery | Admitting: Orthopedic Surgery

## 2021-08-29 DIAGNOSIS — Z79899 Other long term (current) drug therapy: Secondary | ICD-10-CM | POA: Diagnosis not present

## 2021-08-29 DIAGNOSIS — M48061 Spinal stenosis, lumbar region without neurogenic claudication: Principal | ICD-10-CM | POA: Diagnosis present

## 2021-08-29 DIAGNOSIS — Z90710 Acquired absence of both cervix and uterus: Secondary | ICD-10-CM

## 2021-08-29 DIAGNOSIS — F17210 Nicotine dependence, cigarettes, uncomplicated: Secondary | ICD-10-CM | POA: Diagnosis not present

## 2021-08-29 DIAGNOSIS — M1990 Unspecified osteoarthritis, unspecified site: Secondary | ICD-10-CM | POA: Diagnosis present

## 2021-08-29 DIAGNOSIS — J449 Chronic obstructive pulmonary disease, unspecified: Secondary | ICD-10-CM | POA: Diagnosis not present

## 2021-08-29 DIAGNOSIS — Z79890 Hormone replacement therapy (postmenopausal): Secondary | ICD-10-CM

## 2021-08-29 DIAGNOSIS — E039 Hypothyroidism, unspecified: Secondary | ICD-10-CM | POA: Diagnosis present

## 2021-08-29 DIAGNOSIS — Z8249 Family history of ischemic heart disease and other diseases of the circulatory system: Secondary | ICD-10-CM

## 2021-08-29 DIAGNOSIS — Z888 Allergy status to other drugs, medicaments and biological substances status: Secondary | ICD-10-CM

## 2021-08-29 DIAGNOSIS — Z981 Arthrodesis status: Secondary | ICD-10-CM

## 2021-08-29 DIAGNOSIS — M4150 Other secondary scoliosis, site unspecified: Secondary | ICD-10-CM | POA: Diagnosis present

## 2021-08-29 DIAGNOSIS — M5116 Intervertebral disc disorders with radiculopathy, lumbar region: Secondary | ICD-10-CM | POA: Diagnosis not present

## 2021-08-29 DIAGNOSIS — Z791 Long term (current) use of non-steroidal anti-inflammatories (NSAID): Secondary | ICD-10-CM | POA: Diagnosis not present

## 2021-08-29 DIAGNOSIS — Z825 Family history of asthma and other chronic lower respiratory diseases: Secondary | ICD-10-CM | POA: Diagnosis not present

## 2021-08-29 DIAGNOSIS — E119 Type 2 diabetes mellitus without complications: Secondary | ICD-10-CM | POA: Diagnosis not present

## 2021-08-29 DIAGNOSIS — K219 Gastro-esophageal reflux disease without esophagitis: Secondary | ICD-10-CM | POA: Diagnosis present

## 2021-08-29 DIAGNOSIS — M4326 Fusion of spine, lumbar region: Secondary | ICD-10-CM | POA: Diagnosis not present

## 2021-08-29 DIAGNOSIS — I10 Essential (primary) hypertension: Secondary | ICD-10-CM | POA: Diagnosis present

## 2021-08-29 DIAGNOSIS — E785 Hyperlipidemia, unspecified: Secondary | ICD-10-CM | POA: Diagnosis present

## 2021-08-29 DIAGNOSIS — Z419 Encounter for procedure for purposes other than remedying health state, unspecified: Secondary | ICD-10-CM

## 2021-08-29 DIAGNOSIS — Z833 Family history of diabetes mellitus: Secondary | ICD-10-CM

## 2021-08-29 DIAGNOSIS — Z20822 Contact with and (suspected) exposure to covid-19: Secondary | ICD-10-CM | POA: Diagnosis not present

## 2021-08-29 DIAGNOSIS — M4800 Spinal stenosis, site unspecified: Secondary | ICD-10-CM

## 2021-08-29 DIAGNOSIS — R94131 Abnormal electromyogram [EMG]: Secondary | ICD-10-CM | POA: Diagnosis not present

## 2021-08-29 DIAGNOSIS — M549 Dorsalgia, unspecified: Secondary | ICD-10-CM | POA: Diagnosis present

## 2021-08-29 HISTORY — PX: ANTERIOR LAT LUMBAR FUSION: SHX1168

## 2021-08-29 LAB — GLUCOSE, CAPILLARY
Glucose-Capillary: 105 mg/dL — ABNORMAL HIGH (ref 70–99)
Glucose-Capillary: 107 mg/dL — ABNORMAL HIGH (ref 70–99)
Glucose-Capillary: 116 mg/dL — ABNORMAL HIGH (ref 70–99)
Glucose-Capillary: 143 mg/dL — ABNORMAL HIGH (ref 70–99)

## 2021-08-29 SURGERY — ANTERIOR LATERAL LUMBAR FUSION 2 LEVELS
Anesthesia: General | Site: Spine Lumbar

## 2021-08-29 MED ORDER — POLYETHYLENE GLYCOL 3350 17 G PO PACK
17.0000 g | PACK | Freq: Every day | ORAL | Status: DC | PRN
  Filled 2021-08-29: qty 1

## 2021-08-29 MED ORDER — OXYCODONE HCL 5 MG PO TABS
10.0000 mg | ORAL_TABLET | ORAL | Status: DC | PRN
  Administered 2021-08-29 – 2021-08-31 (×8): 10 mg via ORAL
  Filled 2021-08-29 (×7): qty 2

## 2021-08-29 MED ORDER — SODIUM CHLORIDE 0.9% FLUSH: 3.0000 mL | INTRAVENOUS | Status: DC | PRN

## 2021-08-29 MED ORDER — SODIUM CHLORIDE 0.9% FLUSH
3.0000 mL | Freq: Two times a day (BID) | INTRAVENOUS | Status: DC
  Administered 2021-08-29 – 2021-08-30 (×3): 3 mL via INTRAVENOUS

## 2021-08-29 MED ORDER — PROPOFOL 1000 MG/100ML IV EMUL
INTRAVENOUS | Status: AC
  Filled 2021-08-29: qty 100

## 2021-08-29 MED ORDER — PROPOFOL 10 MG/ML IV BOLUS
INTRAVENOUS | Status: AC
  Filled 2021-08-29: qty 20

## 2021-08-29 MED ORDER — MENTHOL 3 MG MT LOZG: 1.0000 | LOZENGE | OROMUCOSAL | Status: DC | PRN

## 2021-08-29 MED ORDER — SUCCINYLCHOLINE CHLORIDE 200 MG/10ML IV SOSY
PREFILLED_SYRINGE | INTRAVENOUS | Status: DC | PRN
  Administered 2021-08-29: 120 mg via INTRAVENOUS

## 2021-08-29 MED ORDER — METHOCARBAMOL 500 MG PO TABS
500.0000 mg | ORAL_TABLET | Freq: Four times a day (QID) | ORAL | Status: DC | PRN
  Administered 2021-08-29 – 2021-08-31 (×4): 500 mg via ORAL
  Filled 2021-08-29 (×4): qty 1

## 2021-08-29 MED ORDER — FENTANYL CITRATE (PF) 250 MCG/5ML IJ SOLN
INTRAMUSCULAR | Status: AC
  Filled 2021-08-29: qty 5

## 2021-08-29 MED ORDER — DEXMEDETOMIDINE (PRECEDEX) IN NS 20 MCG/5ML (4 MCG/ML) IV SYRINGE
PREFILLED_SYRINGE | INTRAVENOUS | Status: AC
  Filled 2021-08-29: qty 5

## 2021-08-29 MED ORDER — LEVOTHYROXINE SODIUM 75 MCG PO TABS
75.0000 ug | ORAL_TABLET | Freq: Every day | ORAL | Status: DC
  Administered 2021-08-30 – 2021-08-31 (×2): 75 ug via ORAL
  Filled 2021-08-29 (×2): qty 1

## 2021-08-29 MED ORDER — VANCOMYCIN HCL IN DEXTROSE 1-5 GM/200ML-% IV SOLN: 1000.0000 mg | INTRAVENOUS | Status: DC

## 2021-08-29 MED ORDER — LACTATED RINGERS IV SOLN: INTRAVENOUS | Status: DC

## 2021-08-29 MED ORDER — METFORMIN HCL 500 MG PO TABS
250.0000 mg | ORAL_TABLET | Freq: Every day | ORAL | Status: DC
  Administered 2021-08-31: 250 mg via ORAL
  Filled 2021-08-29: qty 1

## 2021-08-29 MED ORDER — GLYCOPYRROLATE PF 0.2 MG/ML IJ SOSY
PREFILLED_SYRINGE | INTRAMUSCULAR | Status: DC | PRN
Start: 2021-08-29 — End: 2021-08-29
  Administered 2021-08-29: .2 mg via INTRAVENOUS

## 2021-08-29 MED ORDER — DEXAMETHASONE SODIUM PHOSPHATE 10 MG/ML IJ SOLN
INTRAMUSCULAR | Status: DC | PRN
  Administered 2021-08-29: 5 mg via INTRAVENOUS

## 2021-08-29 MED ORDER — LIDOCAINE 2% (20 MG/ML) 5 ML SYRINGE
INTRAMUSCULAR | Status: AC
  Filled 2021-08-29: qty 5

## 2021-08-29 MED ORDER — FLUTICASONE FUROATE-VILANTEROL 200-25 MCG/ACT IN AEPB
1.0000 | INHALATION_SPRAY | Freq: Every day | RESPIRATORY_TRACT | Status: DC
  Administered 2021-08-31: 1 via RESPIRATORY_TRACT
  Filled 2021-08-29: qty 28

## 2021-08-29 MED ORDER — PROPOFOL 500 MG/50ML IV EMUL
INTRAVENOUS | Status: DC | PRN
  Administered 2021-08-29: 25 ug/kg/min via INTRAVENOUS
  Administered 2021-08-29: 90 ug/kg/min via INTRAVENOUS
  Administered 2021-08-29: 75 ug/kg/min via INTRAVENOUS

## 2021-08-29 MED ORDER — FENTANYL CITRATE (PF) 100 MCG/2ML IJ SOLN
INTRAMUSCULAR | Status: AC
  Filled 2021-08-29: qty 2

## 2021-08-29 MED ORDER — PHENYLEPHRINE HCL-NACL 20-0.9 MG/250ML-% IV SOLN
INTRAVENOUS | Status: DC | PRN
Start: 2021-08-29 — End: 2021-08-29
  Administered 2021-08-29: 100 ug/min via INTRAVENOUS

## 2021-08-29 MED ORDER — BUPIVACAINE-EPINEPHRINE (PF) 0.25% -1:200000 IJ SOLN
INTRAMUSCULAR | Status: AC
  Filled 2021-08-29: qty 30

## 2021-08-29 MED ORDER — GLYCOPYRROLATE PF 0.2 MG/ML IJ SOSY
PREFILLED_SYRINGE | INTRAMUSCULAR | Status: AC
  Filled 2021-08-29: qty 1

## 2021-08-29 MED ORDER — FLEET ENEMA 7-19 GM/118ML RE ENEM
1.0000 | ENEMA | Freq: Once | RECTAL | Status: AC | PRN
  Administered 2021-08-31: 1 via RECTAL
  Filled 2021-08-29: qty 1

## 2021-08-29 MED ORDER — INSULIN ASPART 100 UNIT/ML IJ SOLN: 0.0000 [IU] | Freq: Every day | INTRAMUSCULAR | Status: DC

## 2021-08-29 MED ORDER — CHLORHEXIDINE GLUCONATE 0.12 % MT SOLN
15.0000 mL | Freq: Once | OROMUCOSAL | Status: AC
  Administered 2021-08-29: 07:00:00 15 mL via OROMUCOSAL

## 2021-08-29 MED ORDER — BUPIVACAINE HCL (PF) 0.5 % IJ SOLN
INTRAMUSCULAR | Status: DC | PRN
Start: 2021-08-29 — End: 2021-08-29
  Administered 2021-08-29: 10 mL

## 2021-08-29 MED ORDER — ONDANSETRON HCL 4 MG PO TABS: 4.0000 mg | ORAL_TABLET | Freq: Four times a day (QID) | ORAL | Status: DC | PRN

## 2021-08-29 MED ORDER — GABAPENTIN 300 MG PO CAPS
300.0000 mg | ORAL_CAPSULE | Freq: Three times a day (TID) | ORAL | Status: DC
  Administered 2021-08-29 – 2021-08-31 (×6): 300 mg via ORAL
  Filled 2021-08-29 (×6): qty 1

## 2021-08-29 MED ORDER — PROMETHAZINE HCL 25 MG/ML IJ SOLN: 6.2500 mg | INTRAMUSCULAR | Status: DC | PRN

## 2021-08-29 MED ORDER — DEXAMETHASONE SODIUM PHOSPHATE 10 MG/ML IJ SOLN
INTRAMUSCULAR | Status: AC
  Filled 2021-08-29: qty 1

## 2021-08-29 MED ORDER — METHOCARBAMOL 1000 MG/10ML IJ SOLN
500.0000 mg | Freq: Four times a day (QID) | INTRAVENOUS | Status: DC | PRN
  Filled 2021-08-29: qty 5

## 2021-08-29 MED ORDER — ONDANSETRON HCL 4 MG/2ML IJ SOLN: 4.0000 mg | Freq: Four times a day (QID) | INTRAMUSCULAR | Status: DC | PRN

## 2021-08-29 MED ORDER — PHENOL 1.4 % MT LIQD: 1.0000 | OROMUCOSAL | Status: DC | PRN

## 2021-08-29 MED ORDER — LIDOCAINE 2% (20 MG/ML) 5 ML SYRINGE
INTRAMUSCULAR | Status: DC | PRN
  Administered 2021-08-29: 100 mg via INTRAVENOUS

## 2021-08-29 MED ORDER — HEMOSTATIC AGENTS (NO CHARGE) OPTIME
TOPICAL | Status: DC | PRN
  Administered 2021-08-29: 1 via TOPICAL

## 2021-08-29 MED ORDER — BUPIVACAINE-EPINEPHRINE 0.25% -1:200000 IJ SOLN
INTRAMUSCULAR | Status: DC | PRN
  Administered 2021-08-29: 20 mL

## 2021-08-29 MED ORDER — ORAL CARE MOUTH RINSE: 15.0000 mL | Freq: Once | OROMUCOSAL | Status: AC

## 2021-08-29 MED ORDER — VANCOMYCIN HCL IN DEXTROSE 1-5 GM/200ML-% IV SOLN
1000.0000 mg | INTRAVENOUS | Status: AC
  Administered 2021-08-29: 07:00:00 1000 mg via INTRAVENOUS
  Filled 2021-08-29: qty 200

## 2021-08-29 MED ORDER — LACTATED RINGERS IV SOLN: INTRAVENOUS | Status: DC | PRN

## 2021-08-29 MED ORDER — THROMBIN 20000 UNITS EX SOLR
CUTANEOUS | Status: AC
  Filled 2021-08-29: qty 20000

## 2021-08-29 MED ORDER — ACETAMINOPHEN 500 MG PO TABS
1000.0000 mg | ORAL_TABLET | Freq: Once | ORAL | Status: AC
  Administered 2021-08-29: 07:00:00 1000 mg via ORAL

## 2021-08-29 MED ORDER — DEXMEDETOMIDINE (PRECEDEX) IN NS 20 MCG/5ML (4 MCG/ML) IV SYRINGE
PREFILLED_SYRINGE | INTRAVENOUS | Status: DC | PRN
  Administered 2021-08-29 (×3): 4 ug via INTRAVENOUS

## 2021-08-29 MED ORDER — ACETAMINOPHEN 325 MG PO TABS
650.0000 mg | ORAL_TABLET | ORAL | Status: DC | PRN
  Administered 2021-08-29 – 2021-08-31 (×5): 650 mg via ORAL
  Filled 2021-08-29 (×5): qty 2

## 2021-08-29 MED ORDER — AMISULPRIDE (ANTIEMETIC) 5 MG/2ML IV SOLN: 10.0000 mg | Freq: Once | INTRAVENOUS | Status: DC | PRN

## 2021-08-29 MED ORDER — FENTANYL CITRATE (PF) 250 MCG/5ML IJ SOLN
INTRAMUSCULAR | Status: DC | PRN
  Administered 2021-08-29 (×3): 50 ug via INTRAVENOUS
  Administered 2021-08-29: 100 ug via INTRAVENOUS

## 2021-08-29 MED ORDER — ACETAMINOPHEN 500 MG PO TABS
ORAL_TABLET | ORAL | Status: AC
  Filled 2021-08-29: qty 2

## 2021-08-29 MED ORDER — ATORVASTATIN CALCIUM 10 MG PO TABS
10.0000 mg | ORAL_TABLET | ORAL | Status: DC
  Administered 2021-08-31: 10 mg via ORAL
  Filled 2021-08-29: qty 1

## 2021-08-29 MED ORDER — ONDANSETRON HCL 4 MG/2ML IJ SOLN
INTRAMUSCULAR | Status: AC
  Filled 2021-08-29: qty 2

## 2021-08-29 MED ORDER — SUCCINYLCHOLINE CHLORIDE 200 MG/10ML IV SOSY
PREFILLED_SYRINGE | INTRAVENOUS | Status: AC
  Filled 2021-08-29: qty 10

## 2021-08-29 MED ORDER — ACETAMINOPHEN 650 MG RE SUPP: 650.0000 mg | RECTAL | Status: DC | PRN

## 2021-08-29 MED ORDER — CEFAZOLIN SODIUM-DEXTROSE 1-4 GM/50ML-% IV SOLN
1.0000 g | Freq: Three times a day (TID) | INTRAVENOUS | Status: AC
  Administered 2021-08-29 (×2): 1 g via INTRAVENOUS
  Filled 2021-08-29 (×2): qty 50

## 2021-08-29 MED ORDER — MIDAZOLAM HCL 2 MG/2ML IJ SOLN
INTRAMUSCULAR | Status: AC
  Filled 2021-08-29: qty 2

## 2021-08-29 MED ORDER — SPIRONOLACTONE 25 MG PO TABS
25.0000 mg | ORAL_TABLET | Freq: Two times a day (BID) | ORAL | Status: DC
  Administered 2021-08-29 – 2021-08-31 (×3): 25 mg via ORAL
  Filled 2021-08-29 (×3): qty 1

## 2021-08-29 MED ORDER — PANTOPRAZOLE SODIUM 40 MG PO TBEC
40.0000 mg | DELAYED_RELEASE_TABLET | Freq: Every day | ORAL | Status: DC
  Administered 2021-08-31: 40 mg via ORAL
  Filled 2021-08-29: qty 1

## 2021-08-29 MED ORDER — 0.9 % SODIUM CHLORIDE (POUR BTL) OPTIME
TOPICAL | Status: DC | PRN
  Administered 2021-08-29 (×2): 1000 mL

## 2021-08-29 MED ORDER — THROMBIN 20000 UNITS EX SOLR
CUTANEOUS | Status: DC | PRN
  Administered 2021-08-29: 09:00:00 20 mL via TOPICAL

## 2021-08-29 MED ORDER — FENTANYL CITRATE (PF) 100 MCG/2ML IJ SOLN
25.0000 ug | INTRAMUSCULAR | Status: DC | PRN
  Administered 2021-08-29: 12:00:00 25 ug via INTRAVENOUS

## 2021-08-29 MED ORDER — CHLORHEXIDINE GLUCONATE 0.12 % MT SOLN
OROMUCOSAL | Status: AC
  Filled 2021-08-29: qty 15

## 2021-08-29 MED ORDER — BUPIVACAINE LIPOSOME 1.3 % IJ SUSP
INTRAMUSCULAR | Status: DC | PRN
  Administered 2021-08-29: 10 mL

## 2021-08-29 MED ORDER — ALBUMIN HUMAN 5 % IV SOLN: INTRAVENOUS | Status: DC | PRN

## 2021-08-29 MED ORDER — HYDROMORPHONE HCL 1 MG/ML IJ SOLN
0.5000 mg | INTRAMUSCULAR | Status: DC | PRN
  Administered 2021-08-29: 14:00:00 0.5 mg via INTRAVENOUS
  Filled 2021-08-29: qty 0.5

## 2021-08-29 MED ORDER — INSULIN ASPART 100 UNIT/ML IJ SOLN
0.0000 [IU] | Freq: Three times a day (TID) | INTRAMUSCULAR | Status: DC
  Administered 2021-08-30: 2 [IU] via SUBCUTANEOUS

## 2021-08-29 MED ORDER — MIDAZOLAM HCL 2 MG/2ML IJ SOLN
INTRAMUSCULAR | Status: DC | PRN
Start: 2021-08-29 — End: 2021-08-29
  Administered 2021-08-29 (×2): 1 mg via INTRAVENOUS

## 2021-08-29 MED ORDER — ONDANSETRON HCL 4 MG/2ML IJ SOLN
INTRAMUSCULAR | Status: DC | PRN
Start: 2021-08-29 — End: 2021-08-29
  Administered 2021-08-29: 4 mg via INTRAVENOUS

## 2021-08-29 MED ORDER — PROPOFOL 10 MG/ML IV BOLUS
INTRAVENOUS | Status: DC | PRN
Start: 2021-08-29 — End: 2021-08-29
  Administered 2021-08-29: 150 mg via INTRAVENOUS

## 2021-08-29 MED ORDER — SODIUM CHLORIDE 0.9 % IV SOLN: 250.0000 mL | INTRAVENOUS | Status: DC

## 2021-08-29 MED ORDER — OXYCODONE HCL 5 MG PO TABS
5.0000 mg | ORAL_TABLET | ORAL | Status: DC | PRN
  Administered 2021-08-31: 5 mg via ORAL
  Filled 2021-08-29 (×3): qty 1

## 2021-08-29 SURGICAL SUPPLY — 71 items
AGENT HMST KT MTR STRL THRMB (HEMOSTASIS) ×1
BAG COUNTER SPONGE SURGICOUNT (BAG) ×2 IMPLANT
BAG SPNG CNTER NS LX DISP (BAG) ×1
BLADE CLIPPER SURG (BLADE) IMPLANT
BLADE SURG 10 STRL SS (BLADE) ×2 IMPLANT
BOLT 6.5X50 SPINAL (Bolt) ×1 IMPLANT
BOLT SPNL LRG 45X5.5XPLAT NS (Screw) IMPLANT
BONE MATRIX OSTEOCEL PRO MED (Bone Implant) ×2 IMPLANT
CAGE MODULUS XL 10X18X45 - 10 (Cage) ×1 IMPLANT
CAGE MODULUS XL 8X18X45 - 10 (Cage) ×1 IMPLANT
CATH FOLEY 2WAY SLVR 30CC 16FR (CATHETERS) ×1 IMPLANT
CLSR STERI-STRIP ANTIMIC 1/2X4 (GAUZE/BANDAGES/DRESSINGS) ×1 IMPLANT
COVER SURGICAL LIGHT HANDLE (MISCELLANEOUS) ×2 IMPLANT
DRAIN CHANNEL 15F RND FF W/TCR (WOUND CARE) IMPLANT
DRAPE C-ARM 42X72 X-RAY (DRAPES) ×3 IMPLANT
DRAPE C-ARMOR (DRAPES) ×2 IMPLANT
DRAPE INCISE IOBAN 66X45 STRL (DRAPES) IMPLANT
DRAPE U-SHAPE 47X51 STRL (DRAPES) ×4 IMPLANT
DRSG OPSITE POSTOP 3X4 (GAUZE/BANDAGES/DRESSINGS) ×1 IMPLANT
DRSG OPSITE POSTOP 4X6 (GAUZE/BANDAGES/DRESSINGS) ×2 IMPLANT
DURAPREP 26ML APPLICATOR (WOUND CARE) ×2 IMPLANT
ELECT BLADE 4.0 EZ CLEAN MEGAD (MISCELLANEOUS) ×2
ELECT PENCIL ROCKER SW 15FT (MISCELLANEOUS) ×2 IMPLANT
ELECT REM PT RETURN 9FT ADLT (ELECTROSURGICAL) ×2
ELECTRODE BLDE 4.0 EZ CLN MEGD (MISCELLANEOUS) ×1 IMPLANT
ELECTRODE REM PT RTRN 9FT ADLT (ELECTROSURGICAL) ×1 IMPLANT
GLOVE SURG ENC MOIS LTX SZ6.5 (GLOVE) ×2 IMPLANT
GLOVE SURG MICRO LTX SZ8.5 (GLOVE) ×2 IMPLANT
GLOVE SURG UNDER POLY LF SZ6.5 (GLOVE) ×2 IMPLANT
GLOVE SURG UNDER POLY LF SZ8.5 (GLOVE) ×2 IMPLANT
GOWN STRL REUS W/ TWL LRG LVL3 (GOWN DISPOSABLE) ×1 IMPLANT
GOWN STRL REUS W/ TWL XL LVL3 (GOWN DISPOSABLE) IMPLANT
GOWN STRL REUS W/TWL 2XL LVL3 (GOWN DISPOSABLE) ×4 IMPLANT
GOWN STRL REUS W/TWL LRG LVL3 (GOWN DISPOSABLE) ×2
GOWN STRL REUS W/TWL XL LVL3 (GOWN DISPOSABLE)
KIT BASIN OR (CUSTOM PROCEDURE TRAY) ×2 IMPLANT
KIT DILATOR XLIF 5 (KITS) IMPLANT
KIT SURGICAL ACCESS MAXCESS 4 (KITS) ×1 IMPLANT
KIT TURNOVER KIT B (KITS) ×2 IMPLANT
KIT XLIF (KITS) ×1
MODULE EMG NDL SSEP NVM5 (NEEDLE) IMPLANT
MODULE EMG NEEDLE SSEP NVM5 (NEEDLE) ×2 IMPLANT
MODULE NVM5 NEXT GEN EMG (NEEDLE) ×1 IMPLANT
NDL SPNL 18GX3.5 QUINCKE PK (NEEDLE) ×1 IMPLANT
NEEDLE 22X1 1/2 (OR ONLY) (NEEDLE) ×1 IMPLANT
NEEDLE SPNL 18GX3.5 QUINCKE PK (NEEDLE) ×2 IMPLANT
NS IRRIG 1000ML POUR BTL (IV SOLUTION) ×2 IMPLANT
PACK LAMINECTOMY ORTHO (CUSTOM PROCEDURE TRAY) ×2 IMPLANT
PACK UNIVERSAL I (CUSTOM PROCEDURE TRAY) ×2 IMPLANT
PAD ARMBOARD 7.5X6 YLW CONV (MISCELLANEOUS) ×4 IMPLANT
PIN FIXATION XLIF DECADE (PIN) ×1 IMPLANT
PLATE DECADE XLIP 2H SZ12 (Plate) ×1 IMPLANT
PROBE BALL TIP NVM5 SNG USE (BALLOONS) ×1 IMPLANT
SCREW 45MM (Screw) ×2 IMPLANT
SCREW DECADE 5.5X50 (Screw) ×1 IMPLANT
SPONGE SURGIFOAM ABS GEL 100 (HEMOSTASIS) ×1 IMPLANT
SPONGE T-LAP 4X18 ~~LOC~~+RFID (SPONGE) ×4 IMPLANT
STAPLER VISISTAT 35W (STAPLE) ×2 IMPLANT
STRIP CLOSURE SKIN 1/2X4 (GAUZE/BANDAGES/DRESSINGS) ×1 IMPLANT
SURGIFLO W/THROMBIN 8M KIT (HEMOSTASIS) ×1 IMPLANT
SUT BONE WAX W31G (SUTURE) IMPLANT
SUT MNCRL AB 3-0 PS2 27 (SUTURE) ×2 IMPLANT
SUT VIC AB 1 CT1 18XCR BRD 8 (SUTURE) ×2 IMPLANT
SUT VIC AB 1 CT1 8-18 (SUTURE) ×4
SUT VIC AB 2-0 CT1 18 (SUTURE) ×3 IMPLANT
SYR BULB IRRIG 60ML STRL (SYRINGE) ×2 IMPLANT
TAPE CLOTH 4X10 WHT NS (GAUZE/BANDAGES/DRESSINGS) ×2 IMPLANT
TOWEL GREEN STERILE (TOWEL DISPOSABLE) ×2 IMPLANT
TOWEL GREEN STERILE FF (TOWEL DISPOSABLE) ×2 IMPLANT
WATER STERILE IRR 1000ML POUR (IV SOLUTION) IMPLANT
YANKAUER SUCT BULB TIP NO VENT (SUCTIONS) ×1 IMPLANT

## 2021-08-29 NOTE — Op Note (Signed)
OPERATIVE REPORT  DATE OF SURGERY: 08/29/2021  PATIENT NAME:  Tina Neal MRN: [id number] DOB: Dec 30, 1950  PCP: Josetta Huddle, MD  PRE-OPERATIVE DIAGNOSIS: Degenerative lumbar disc disease with spinal stenosis L2-4.  Prior L4-5 fusion  POST-OPERATIVE DIAGNOSIS: Same  PROCEDURE:   Lateral interbody fusion L2-3, and L3-4.  Application of lateral plate L3-4  SURGEON:  Melina Schools, MD  PHYSICIAN ASSISTANT: Nelson Chimes, PA  ANESTHESIA:   General  EBL: 50 ml   Complications: None  Implants: Lordotic NuVasive lateral fusion cage.  8 x 18 x 45 at L2.  10 x 18 x 45 at L3 NuVasive 12 mm lateral plate.  5.5 x 45 mm length screw at L3.  6.5 x 50 mm screw at L4.  Graft:osteocell  BRIEF HISTORY: Tina Neal is a 71 y.o. female who had a previous L4-5 fusion several years ago.  She has done well until recently when she started having increasing back buttock and leg pain.  Imaging studies demonstrated progression in her degenerative disease L2-3 and L3-4 with spinal stenosis and degenerative scoliosis.  The patient's condition continued to deteriorate despite physical therapy and injection therapy.  As result of the progressive pain we elected to move forward with surgery.  After discussing treatment options and reviewing the risks and benefits of she consented to the surgery.  PROCEDURE DETAILS: Patient was brought into the operating room and was properly positioned on the operating room table.  After induction with general anesthesia the patient was endotracheally intubated.  A timeout was taken to confirm all important data: including patient, procedure, and the level. Teds, SCD's were applied.   Foley and the intraoperative neuro monitoring pads and needles were applied by the representative.  The patient was then turned into the lateral decubitus position left side up and an axillary roll was placed.  All bony prominences well-padded and the patient was directly secured to the  table with tape.  I made sure that the L3-4 and L2-3 levels were properly centered prior to securing the patient to the table with tape.  The lateral flank was then prepped and draped in a standard fashion.  Using fluoroscopy I marked out to the midportion of the L3 vertebral body.  I infiltrated this area with quarter percent Marcaine with epinephrine.  A left lateral incision was made and sharp dissection was carried out down to the fascia of the external oblique.  A second small incision was made 1 fingerbreadth posteriorly and I bluntly dissected down to the posterior aspect of the retroperitonum.  I then gently entered into the retroperitoneal space and began mobilizing the soft tissue.  I then dissected through the internal oblique and external oblique until I could see my finger.  In the lateral incision.  I then placed the first dilating tube down to the lateral aspect of the psoas over the L3-4 disc space.  I stimulated the psoas and then went through the psoas and parked on the midportion of the disc space.  I then confirmed proper positioning of the first dilating tube with fluoroscopy.  I sequentially dilated and stimulated circumferentially confirming there was no compression or trauma to the plexus.  Once I had the final dilator and I placed the working retractor.  The dilating tubes were removed and then I gently repositioned the retractor by pulling it posteriorly to keep a healthy cuff of psoas muscle to protect the plexus.  Once I properly position the retractor I confirmed satisfactory positioning with AP and  lateral fluoroscopy.  I again stimulated behind each of the blades confirming there was no trauma to the plexus.  I secured the posterior blade into the disc space with a shim and advanced the lateral blades until I could see the entire lateral aspect of the disc space.  I confirmed satisfactory positioning and exposure in both the AP and lateral planes and then incised the annulus.  An  osteotome was used to release the disc from the endplate and released the contralateral annulus.  A box osteotome was then used to remove the bulk of the disc material.  Then using curettes and Kerrison rongeurs as well as pituitary rongeurs I removed all the remaining disc material.  I then used a sidecutting curettes to completely remove the cartilaginous endplate to expose bleeding subchondral bone.  I then rasped to ensure satisfactory discectomy.  I then used the trialing devices and elected to use the size 10 lordotic implant.  This provided overall excellent restoration of disc height and  indirect foraminal and lateral recess decompression.  The implant was obtained and packed with the allograft and then malleted to the appropriate depth.  The patient had a prior posterior TLIF and had pedicle screws at L4 and L5.  At this point I elected to attempt to place a lateral plate to provide stability to the intervertebral device at the L3-4 level.  If successful I would avoid needing to expose the L4-5 pedicle screw construct and remove and replace these older screws.  The lateral plate was 12 mm of the pedicle and secured to the L3 vertebral body with temporary set pin.  I was able to pass the all just below the  pedicle screw and across the vertebral body to the contralateral side.  I then placed the 50 mm length screw which had excellent purchase.  I then used the awl and placed a 45 mm screw to secure it to the L3 vertebral body.  Both screws had excellent purchase.  There was some difficulty locking the L4 screw and so was removed and replaced with a rescue screw that was 6.5 in diameter.  Both screws had excellent purchase and were tightened according to manufacture standards.  At this point with the interbody fusion and lateral plate complete I removed the retractor and irrigated the wound copiously normal saline.  I then repositioned and placed the dilating tube down onto the L2-3 disc space.    Using  the same technique I placed my first dilating tube at the midportion of the L2-3 disc space and stimulated circumferentially to confirm satisfactory position.  Once this was done I sequentially dilated and then ultimately placed my working trocar.  I then pulled the working trocar posteriorly until it came to rest in the posterior third of the disc space.  I confirmed satisfactory positioning with fluoroscopy.  I then stimulated behind each of the blades confirming there was no adverse activity to the plexus.  In addition the SSEPs and EMGs remained satisfactory.  Once the retractors were properly positioned I performed an annulotomy at L2-3.  Again using pituitary rongeurs and curettes I removed all of the disc material.  The box osteotome was advanced across the disc space.  There was some more collapse of the disc space then at L2-3 and so I elected to use the 8 mm cage.  When I placed the trial it had added quickly restored intervertebral disc height and improve the foraminal volume.  The implant was obtained and packed  with the allograft.  I rasped the endplates until I confirmed bleeding subchondral bone and the cartilaginous endplate was removed.  The cage was active with the allograft and then malleted into the appropriate depth.  Imaging confirmed satisfactory placement of the cage in both planes.  At this point time with both implants properly seated I returned to the bed to the neutral position.  At this point I then reexposed the L3-4 level with hand-held retractors and then locked with the plate in place according manufacture standards.  The wound was copiously irrigated with normal saline and I confirmed hemostasis using bipolar cautery and Floseal.  Once all the retractors were removed I took my final intraoperative lateral and AP fluoroscopy views confirming satisfactory positioning of both cages and the lateral plate.  In addition there was no evidence of any retained surgical instruments in the  wound.  After final repeat irrigation I closed the deep fascia of the external oblique with interrupted #1 Vicryl sutures.  The posterior wound was closed in a layered fashion with interrupted #1 Vicryl suture, 2-0 Vicryl suture, 3-0 Monocryl for the skin.  The lateral incision was closed also in a layered fashion with interrupted 2-0 Vicryl suture, and 3-0 Monocryl stitches.  Steri-Strips and dry dressings applied and the patient was extubated transfer the PACU without incident.  The end of the case all needle sponge counts were correct.  Melina Schools, MD 08/29/2021 10:37 AM

## 2021-08-29 NOTE — Anesthesia Procedure Notes (Signed)
Anesthesia Regional Block: TAP block   Pre-Anesthetic Checklist: , timeout performed,  Correct Patient, Correct Site, Correct Laterality,  Correct Procedure, Correct Position, site marked,  Risks and benefits discussed,  Surgical consent,  Pre-op evaluation,  At surgeon's request and post-op pain management  Laterality: Left  Prep: chloraprep       Needles:  Injection technique: Single-shot  Needle Type: Echogenic Stimulator Needle     Needle Length: 10cm  Needle Gauge: 21     Additional Needles:   Procedures:,,,, ultrasound used (permanent image in chart),,    Narrative:  Start time: 08/29/2021 7:01 AM End time: 08/29/2021 7:11 AM Injection made incrementally with aspirations every 5 mL.  Performed by: Personally

## 2021-08-29 NOTE — Transfer of Care (Signed)
Immediate Anesthesia Transfer of Care Note  Patient: Tina Neal  Procedure(s) Performed: EXTREME LATERAL INTERBODY FUSION LUMBAR TWO THROUGH FOUR (Spine Lumbar)  Patient Location: PACU  Anesthesia Type:GA combined with regional for post-op pain  Level of Consciousness: drowsy, patient cooperative and responds to stimulation  Airway & Oxygen Therapy: Patient Spontanous Breathing and Patient connected to nasal cannula oxygen  Post-op Assessment: Report given to RN, Post -op Vital signs reviewed and stable and Patient moving all extremities X 4  Post vital signs: Reviewed and stable  Last Vitals:  Vitals Value Taken Time  BP    Temp    Pulse 71 08/29/21 1132  Resp 13 08/29/21 1132  SpO2 94 % 08/29/21 1132  Vitals shown include unvalidated device data.  Last Pain:  Vitals:   08/29/21 0624  TempSrc:   PainSc: 3          Complications: No notable events documented.

## 2021-08-29 NOTE — Anesthesia Postprocedure Evaluation (Signed)
Anesthesia Post Note  Patient: ZAKIYAH DIOP  Procedure(s) Performed: EXTREME LATERAL INTERBODY FUSION LUMBAR TWO THROUGH FOUR (Spine Lumbar)     Patient location during evaluation: PACU Anesthesia Type: General Level of consciousness: sedated Pain management: pain level controlled Vital Signs Assessment: post-procedure vital signs reviewed and stable Respiratory status: spontaneous breathing and respiratory function stable Cardiovascular status: stable Postop Assessment: no apparent nausea or vomiting Anesthetic complications: no   No notable events documented.  Last Vitals:  Vitals:   08/29/21 1233 08/29/21 1255  BP: 131/76 119/73  Pulse: 73 64  Resp: 12 18  Temp: 36.9 C 36.6 C  SpO2: 97% 100%    Last Pain:  Vitals:   08/29/21 1233  TempSrc:   PainSc: Asleep                 Clark Cuff DANIEL

## 2021-08-29 NOTE — H&P (Signed)
Addendum H&P: There is been no change in her clinical exam since her last office visit of 08/20/2021.  Patient has had a prior lumbar fusion (L4-5) and now has adjacent segment degenerative disease L2-4.  She has significant spinal stenosis L2-4 with advanced degenerative disc disease.  As result of the failure of conservative management we have elected to move forward with extending her fusion cephalad.  All appropriate risks, benefits, and alternatives to surgery were discussed with the patient and consent was obtained.  This will be a 2 staged surgical procedure.  Today will be the lateral interbody fusions L2-3 L3-4 and then tomorrow we will return for posterior supplemental pedicle screw fixation and if needed additional decompression.

## 2021-08-29 NOTE — Anesthesia Procedure Notes (Signed)
Procedure Name: Intubation Date/Time: 08/29/2021 7:45 AM Performed by: Michele Rockers, CRNA Pre-anesthesia Checklist: Patient identified, Patient being monitored, Timeout performed, Emergency Drugs available and Suction available Patient Re-evaluated:Patient Re-evaluated prior to induction Oxygen Delivery Method: Circle System Utilized Preoxygenation: Pre-oxygenation with 100% oxygen Induction Type: IV induction Ventilation: Mask ventilation without difficulty Laryngoscope Size: Miller and 2 Grade View: Grade I Tube type: Oral Tube size: 7.0 mm Number of attempts: 1 Airway Equipment and Method: Stylet Placement Confirmation: ETT inserted through vocal cords under direct vision, positive ETCO2 and breath sounds checked- equal and bilateral Secured at: 21 cm Tube secured with: Tape Dental Injury: Teeth and Oropharynx as per pre-operative assessment

## 2021-08-29 NOTE — Brief Op Note (Signed)
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 female  PRE-OPERATIVE DIAGNOSIS:  Adjacent segment degenerative disc disease with radicular leg pain  POST-OPERATIVE DIAGNOSIS:  Adjacent segment degenerative disc disease with radicular leg   PROCEDURE:  Procedure(s) with comments: EXTREME LATERAL INTERBODY FUSION LUMBAR TWO THROUGH FOUR (N/A) - 3.5 hrs left tap block with exparel 3 C-Bed  SURGEON:  Surgeon(s) and Role:    Melina Schools, MD - Primary  PHYSICIAN ASSISTANT:   ASSISTANTS: Nelson Chimes, PA  ANESTHESIA:   general  EBL:  50 mL   BLOOD ADMINISTERED:none  DRAINS: none   LOCAL MEDICATIONS USED:  MARCAINE     SPECIMEN:  No Specimen  DISPOSITION OF SPECIMEN:  N/A  COUNTS:  YES  TOURNIQUET:  * No tourniquets in log *  DICTATION: .Dragon Dictation  PLAN OF CARE: Admit to inpatient   PATIENT DISPOSITION:  PACU - hemodynamically stable.

## 2021-08-29 NOTE — Anesthesia Preprocedure Evaluation (Addendum)
Anesthesia Evaluation  Patient identified by MRN, date of birth, ID band Patient awake    Reviewed: Allergy & Precautions, NPO status , Patient's Chart, lab work & pertinent test results  History of Anesthesia Complications Negative for: history of anesthetic complications  Airway Mallampati: I  TM Distance: >3 FB Neck ROM: Full    Dental  (+) Edentulous Upper, Partial Lower   Pulmonary COPD, Current Smoker and Patient abstained from smoking.,    Pulmonary exam normal        Cardiovascular hypertension, Pt. on medications Normal cardiovascular exam     Neuro/Psych negative neurological ROS     GI/Hepatic Neg liver ROS, GERD  ,  Endo/Other  diabetesHypothyroidism   Renal/GU negative Renal ROS     Musculoskeletal negative musculoskeletal ROS (+)   Abdominal   Peds  Hematology negative hematology ROS (+)   Anesthesia Other Findings   Reproductive/Obstetrics                            Anesthesia Physical  Anesthesia Plan  ASA: 3  Anesthesia Plan: General   Post-op Pain Management: Tylenol PO (pre-op) and Regional block   Induction: Intravenous  PONV Risk Score and Plan: 3 and Ondansetron, Dexamethasone and Midazolam  Airway Management Planned: Oral ETT  Additional Equipment:   Intra-op Plan:   Post-operative Plan: Extubation in OR  Informed Consent: I have reviewed the patients History and Physical, chart, labs and discussed the procedure including the risks, benefits and alternatives for the proposed anesthesia with the patient or authorized representative who has indicated his/her understanding and acceptance.     Dental advisory given  Plan Discussed with: Anesthesiologist and CRNA  Anesthesia Plan Comments:        Anesthesia Quick Evaluation

## 2021-08-30 ENCOUNTER — Other Ambulatory Visit: Payer: Self-pay

## 2021-08-30 ENCOUNTER — Inpatient Hospital Stay (HOSPITAL_COMMUNITY): Payer: Medicare HMO

## 2021-08-30 ENCOUNTER — Inpatient Hospital Stay (HOSPITAL_COMMUNITY): Payer: Medicare HMO | Admitting: Anesthesiology

## 2021-08-30 ENCOUNTER — Inpatient Hospital Stay (HOSPITAL_COMMUNITY): Admission: RE | Admit: 2021-08-30 | Payer: Medicare HMO | Source: Home / Self Care | Admitting: Orthopedic Surgery

## 2021-08-30 ENCOUNTER — Encounter (HOSPITAL_COMMUNITY)
Admission: RE | Disposition: A | Discharge status: Home / Self Care | Payer: Self-pay | Source: Home / Self Care | Attending: Orthopedic Surgery

## 2021-08-30 ENCOUNTER — Encounter (HOSPITAL_COMMUNITY): Payer: Self-pay | Admitting: Orthopedic Surgery

## 2021-08-30 DIAGNOSIS — Z981 Arthrodesis status: Secondary | ICD-10-CM

## 2021-08-30 LAB — GLUCOSE, CAPILLARY
Glucose-Capillary: 109 mg/dL — ABNORMAL HIGH (ref 70–99)
Glucose-Capillary: 113 mg/dL — ABNORMAL HIGH (ref 70–99)
Glucose-Capillary: 72 mg/dL (ref 70–99)
Glucose-Capillary: 124 mg/dL — ABNORMAL HIGH (ref 70–99)
Glucose-Capillary: 130 mg/dL — ABNORMAL HIGH (ref 70–99)

## 2021-08-30 SURGERY — POSTERIOR LUMBAR FUSION 2 LEVEL
Anesthesia: General | Site: Spine Lumbar

## 2021-08-30 MED ORDER — PHENYLEPHRINE HCL-NACL 20-0.9 MG/250ML-% IV SOLN
INTRAVENOUS | Status: DC | PRN
  Administered 2021-08-30: 50 ug/min via INTRAVENOUS

## 2021-08-30 MED ORDER — VANCOMYCIN HCL 1000 MG IV SOLR
INTRAVENOUS | Status: DC | PRN
  Administered 2021-08-30: 1000 mg via INTRAVENOUS

## 2021-08-30 MED ORDER — SODIUM CHLORIDE 0.9 % IV SOLN: 250.0000 mL | INTRAVENOUS | Status: DC

## 2021-08-30 MED ORDER — 0.9 % SODIUM CHLORIDE (POUR BTL) OPTIME
TOPICAL | Status: DC | PRN
Start: 2021-08-30 — End: 2021-08-30
  Administered 2021-08-30: 1000 mL

## 2021-08-30 MED ORDER — ONDANSETRON HCL 4 MG/2ML IJ SOLN
INTRAMUSCULAR | Status: AC
  Filled 2021-08-30: qty 2

## 2021-08-30 MED ORDER — ONDANSETRON HCL 4 MG/2ML IJ SOLN: 4.0000 mg | Freq: Four times a day (QID) | INTRAMUSCULAR | Status: DC | PRN

## 2021-08-30 MED ORDER — FLEET ENEMA 7-19 GM/118ML RE ENEM: 1.0000 | ENEMA | Freq: Once | RECTAL | Status: DC | PRN

## 2021-08-30 MED ORDER — ONDANSETRON HCL 4 MG/2ML IJ SOLN
INTRAMUSCULAR | Status: DC | PRN
Start: 2021-08-30 — End: 2021-08-30
  Administered 2021-08-30: 4 mg via INTRAVENOUS

## 2021-08-30 MED ORDER — FENTANYL CITRATE (PF) 100 MCG/2ML IJ SOLN
25.0000 ug | INTRAMUSCULAR | Status: DC | PRN
  Administered 2021-08-30: 50 ug via INTRAVENOUS

## 2021-08-30 MED ORDER — ROPIVACAINE HCL 7.5 MG/ML IJ SOLN
INTRAMUSCULAR | Status: DC | PRN
Start: 2021-08-30 — End: 2021-08-30
  Administered 2021-08-30: 20 mL via PERINEURAL

## 2021-08-30 MED ORDER — HYDROMORPHONE HCL 1 MG/ML IJ SOLN: 0.5000 mg | INTRAMUSCULAR | Status: DC | PRN

## 2021-08-30 MED ORDER — ACETAMINOPHEN 650 MG RE SUPP: 650.0000 mg | RECTAL | Status: DC | PRN

## 2021-08-30 MED ORDER — AMISULPRIDE (ANTIEMETIC) 5 MG/2ML IV SOLN: 10.0000 mg | Freq: Once | INTRAVENOUS | Status: DC | PRN

## 2021-08-30 MED ORDER — METHOCARBAMOL 500 MG PO TABS
500.0000 mg | ORAL_TABLET | Freq: Three times a day (TID) | ORAL | 0 refills | Status: AC | PRN
Start: 2021-08-30 — End: 2021-09-04

## 2021-08-30 MED ORDER — CEFAZOLIN SODIUM-DEXTROSE 1-4 GM/50ML-% IV SOLN
1.0000 g | Freq: Three times a day (TID) | INTRAVENOUS | Status: AC
  Administered 2021-08-30 (×2): 1 g via INTRAVENOUS
  Filled 2021-08-30 (×2): qty 50

## 2021-08-30 MED ORDER — SURGIFLO WITH THROMBIN (HEMOSTATIC MATRIX KIT) OPTIME: TOPICAL | Status: DC | PRN

## 2021-08-30 MED ORDER — FENTANYL CITRATE (PF) 250 MCG/5ML IJ SOLN
INTRAMUSCULAR | Status: DC | PRN
  Administered 2021-08-30: 100 ug via INTRAVENOUS
  Administered 2021-08-30 (×2): 50 ug via INTRAVENOUS

## 2021-08-30 MED ORDER — INSULIN ASPART 100 UNIT/ML IJ SOLN: 0.0000 [IU] | Freq: Three times a day (TID) | INTRAMUSCULAR | Status: DC

## 2021-08-30 MED ORDER — LACTATED RINGERS IV SOLN: INTRAVENOUS | Status: DC | PRN

## 2021-08-30 MED ORDER — BUPIVACAINE-EPINEPHRINE 0.25% -1:200000 IJ SOLN
INTRAMUSCULAR | Status: DC | PRN
  Administered 2021-08-30: 10 mL

## 2021-08-30 MED ORDER — LACTATED RINGERS IV SOLN: INTRAVENOUS | Status: DC

## 2021-08-30 MED ORDER — PHENYLEPHRINE 40 MCG/ML (10ML) SYRINGE FOR IV PUSH (FOR BLOOD PRESSURE SUPPORT)
PREFILLED_SYRINGE | INTRAVENOUS | Status: DC | PRN
Start: 2021-08-30 — End: 2021-08-30
  Administered 2021-08-30: 120 ug via INTRAVENOUS
  Administered 2021-08-30: 80 ug via INTRAVENOUS
  Administered 2021-08-30: 120 ug via INTRAVENOUS
  Administered 2021-08-30: 80 ug via INTRAVENOUS

## 2021-08-30 MED ORDER — OXYCODONE HCL 5 MG PO TABS: 5.0000 mg | ORAL_TABLET | ORAL | Status: DC | PRN

## 2021-08-30 MED ORDER — DEXAMETHASONE SODIUM PHOSPHATE 10 MG/ML IJ SOLN
INTRAMUSCULAR | Status: DC | PRN
  Administered 2021-08-30: 4 mg via INTRAVENOUS

## 2021-08-30 MED ORDER — POLYETHYLENE GLYCOL 3350 17 G PO PACK: 17.0000 g | PACK | Freq: Every day | ORAL | Status: DC | PRN

## 2021-08-30 MED ORDER — ONDANSETRON HCL 4 MG PO TABS: 4.0000 mg | ORAL_TABLET | Freq: Three times a day (TID) | ORAL | 0 refills | Status: DC | PRN

## 2021-08-30 MED ORDER — ACETAMINOPHEN 325 MG PO TABS: 650.0000 mg | ORAL_TABLET | ORAL | Status: DC | PRN

## 2021-08-30 MED ORDER — OXYCODONE HCL 5 MG PO TABS: 10.0000 mg | ORAL_TABLET | ORAL | Status: DC | PRN

## 2021-08-30 MED ORDER — METHOCARBAMOL 1000 MG/10ML IJ SOLN: 500.0000 mg | Freq: Four times a day (QID) | INTRAVENOUS | Status: DC | PRN

## 2021-08-30 MED ORDER — PROPOFOL 10 MG/ML IV BOLUS
INTRAVENOUS | Status: AC
  Filled 2021-08-30: qty 20

## 2021-08-30 MED ORDER — MIDAZOLAM HCL 2 MG/2ML IJ SOLN
INTRAMUSCULAR | Status: AC
  Filled 2021-08-30: qty 2

## 2021-08-30 MED ORDER — SUCCINYLCHOLINE CHLORIDE 200 MG/10ML IV SOSY
PREFILLED_SYRINGE | INTRAVENOUS | Status: AC
  Filled 2021-08-30: qty 10

## 2021-08-30 MED ORDER — INSULIN ASPART 100 UNIT/ML IJ SOLN: 0.0000 [IU] | Freq: Every day | INTRAMUSCULAR | Status: DC

## 2021-08-30 MED ORDER — VANCOMYCIN HCL IN DEXTROSE 1-5 GM/200ML-% IV SOLN
INTRAVENOUS | Status: AC
  Filled 2021-08-30: qty 200

## 2021-08-30 MED ORDER — PROPOFOL 10 MG/ML IV BOLUS
INTRAVENOUS | Status: DC | PRN
  Administered 2021-08-30: 50 mg via INTRAVENOUS
  Administered 2021-08-30: 100 mg via INTRAVENOUS

## 2021-08-30 MED ORDER — PHENOL 1.4 % MT LIQD: 1.0000 | OROMUCOSAL | Status: DC | PRN

## 2021-08-30 MED ORDER — SODIUM CHLORIDE 0.9% FLUSH
3.0000 mL | Freq: Two times a day (BID) | INTRAVENOUS | Status: DC
  Administered 2021-08-30 (×2): 3 mL via INTRAVENOUS

## 2021-08-30 MED ORDER — METHOCARBAMOL 500 MG PO TABS: 500.0000 mg | ORAL_TABLET | Freq: Four times a day (QID) | ORAL | Status: DC | PRN

## 2021-08-30 MED ORDER — PHENYLEPHRINE 40 MCG/ML (10ML) SYRINGE FOR IV PUSH (FOR BLOOD PRESSURE SUPPORT)
PREFILLED_SYRINGE | INTRAVENOUS | Status: AC
  Filled 2021-08-30: qty 10

## 2021-08-30 MED ORDER — LIDOCAINE 2% (20 MG/ML) 5 ML SYRINGE
INTRAMUSCULAR | Status: DC | PRN
  Administered 2021-08-30: 100 mg via INTRAVENOUS

## 2021-08-30 MED ORDER — MENTHOL 3 MG MT LOZG: 1.0000 | LOZENGE | OROMUCOSAL | Status: DC | PRN

## 2021-08-30 MED ORDER — FENTANYL CITRATE (PF) 100 MCG/2ML IJ SOLN
INTRAMUSCULAR | Status: AC
  Filled 2021-08-30: qty 2

## 2021-08-30 MED ORDER — MIDAZOLAM HCL 2 MG/2ML IJ SOLN
INTRAMUSCULAR | Status: DC | PRN
Start: 2021-08-30 — End: 2021-08-30
  Administered 2021-08-30: 1 mg via INTRAVENOUS

## 2021-08-30 MED ORDER — FENTANYL CITRATE (PF) 250 MCG/5ML IJ SOLN
INTRAMUSCULAR | Status: AC
  Filled 2021-08-30: qty 5

## 2021-08-30 MED ORDER — PROMETHAZINE HCL 25 MG/ML IJ SOLN: 6.2500 mg | INTRAMUSCULAR | Status: DC | PRN

## 2021-08-30 MED ORDER — LIDOCAINE 2% (20 MG/ML) 5 ML SYRINGE
INTRAMUSCULAR | Status: AC
  Filled 2021-08-30: qty 5

## 2021-08-30 MED ORDER — SODIUM CHLORIDE 0.9% FLUSH: 3.0000 mL | INTRAVENOUS | Status: DC | PRN

## 2021-08-30 MED ORDER — BUPIVACAINE-EPINEPHRINE (PF) 0.25% -1:200000 IJ SOLN
INTRAMUSCULAR | Status: AC
  Filled 2021-08-30: qty 30

## 2021-08-30 MED ORDER — THROMBIN 20000 UNITS EX SOLR
CUTANEOUS | Status: AC
  Filled 2021-08-30: qty 20000

## 2021-08-30 MED ORDER — OXYCODONE-ACETAMINOPHEN 10-325 MG PO TABS: 1.0000 | ORAL_TABLET | Freq: Four times a day (QID) | ORAL | 0 refills | Status: AC | PRN

## 2021-08-30 MED ORDER — ONDANSETRON HCL 4 MG PO TABS: 4.0000 mg | ORAL_TABLET | Freq: Four times a day (QID) | ORAL | Status: DC | PRN

## 2021-08-30 MED ORDER — DEXAMETHASONE SODIUM PHOSPHATE 10 MG/ML IJ SOLN
INTRAMUSCULAR | Status: AC
  Filled 2021-08-30: qty 1

## 2021-08-30 MED ORDER — SUCCINYLCHOLINE CHLORIDE 200 MG/10ML IV SOSY
PREFILLED_SYRINGE | INTRAVENOUS | Status: DC | PRN
Start: 2021-08-30 — End: 2021-08-30
  Administered 2021-08-30: 120 mg via INTRAVENOUS

## 2021-08-30 SURGICAL SUPPLY — 73 items
AGENT HMST KT MTR STRL THRMB (HEMOSTASIS)
BAG COUNTER SPONGE SURGICOUNT (BAG) ×2 IMPLANT
BAG SPNG CNTER NS LX DISP (BAG) ×1
BLADE CLIPPER SURG (BLADE) IMPLANT
BUR EGG ELITE 4.0 (BURR) IMPLANT
BUR NEURO DRILL SOFT 3.0X3.8M (BURR) ×1 IMPLANT
CABLE BIPOLOR RESECTION CORD (MISCELLANEOUS) ×1 IMPLANT
CAP RELINE MOD TULIP RMM (Cap) ×4 IMPLANT
CLIP NEUROVISION LG (CLIP) ×1 IMPLANT
CLSR STERI-STRIP ANTIMIC 1/2X4 (GAUZE/BANDAGES/DRESSINGS) ×1 IMPLANT
COVER MAYO STAND STRL (DRAPES) IMPLANT
COVER SURGICAL LIGHT HANDLE (MISCELLANEOUS) ×2 IMPLANT
DRAIN CHANNEL 15F RND FF W/TCR (WOUND CARE) IMPLANT
DRAPE C-ARM 42X72 X-RAY (DRAPES) ×2 IMPLANT
DRAPE C-ARMOR (DRAPES) ×2 IMPLANT
DRAPE POUCH INSTRU U-SHP 10X18 (DRAPES) ×2 IMPLANT
DRAPE SURG 17X23 STRL (DRAPES) ×2 IMPLANT
DRAPE U-SHAPE 47X51 STRL (DRAPES) ×2 IMPLANT
DRSG OPSITE POSTOP 3X4 (GAUZE/BANDAGES/DRESSINGS) ×1 IMPLANT
DRSG OPSITE POSTOP 4X6 (GAUZE/BANDAGES/DRESSINGS) ×1 IMPLANT
DRSG OPSITE POSTOP 4X8 (GAUZE/BANDAGES/DRESSINGS) ×1 IMPLANT
DURAPREP 26ML APPLICATOR (WOUND CARE) ×2 IMPLANT
ELECT BLADE 4.0 EZ CLEAN MEGAD (MISCELLANEOUS) ×2
ELECT BLADE 6.5 EXT (BLADE) ×1 IMPLANT
ELECT CAUTERY BLADE 6.4 (BLADE) ×2 IMPLANT
ELECT PENCIL ROCKER SW 15FT (MISCELLANEOUS) ×2 IMPLANT
ELECT REM PT RETURN 9FT ADLT (ELECTROSURGICAL) ×2
ELECTRODE BLDE 4.0 EZ CLN MEGD (MISCELLANEOUS) IMPLANT
ELECTRODE REM PT RTRN 9FT ADLT (ELECTROSURGICAL) ×1 IMPLANT
GLOVE SURG MICRO LTX SZ8.5 (GLOVE) ×2 IMPLANT
GLOVE SURG UNDER POLY LF SZ8.5 (GLOVE) ×2 IMPLANT
GOWN STRL REUS W/ TWL LRG LVL3 (GOWN DISPOSABLE) ×1 IMPLANT
GOWN STRL REUS W/TWL 2XL LVL3 (GOWN DISPOSABLE) ×4 IMPLANT
GOWN STRL REUS W/TWL LRG LVL3 (GOWN DISPOSABLE) ×2
KIT BASIN OR (CUSTOM PROCEDURE TRAY) ×2 IMPLANT
KIT POSITION SURG JACKSON T1 (MISCELLANEOUS) ×2 IMPLANT
KIT TURNOVER KIT B (KITS) ×2 IMPLANT
MIX DBX 10CC 35% BONE (Bone Implant) ×1 IMPLANT
MODULE EMG NDL SSEP NVM5 (NEEDLE) IMPLANT
MODULE EMG NEEDLE SSEP NVM5 (NEEDLE) ×2 IMPLANT
MODULE NVM5 NEXT GEN EMG (NEEDLE) ×1 IMPLANT
NDL SPNL 18GX3.5 QUINCKE PK (NEEDLE) ×1 IMPLANT
NEEDLE 22X1 1/2 (OR ONLY) (NEEDLE) ×2 IMPLANT
NEEDLE SPNL 18GX3.5 QUINCKE PK (NEEDLE) ×2 IMPLANT
NS IRRIG 1000ML POUR BTL (IV SOLUTION) ×2 IMPLANT
PACK LAMINECTOMY ORTHO (CUSTOM PROCEDURE TRAY) ×2 IMPLANT
PACK UNIVERSAL I (CUSTOM PROCEDURE TRAY) ×2 IMPLANT
PAD ARMBOARD 7.5X6 YLW CONV (MISCELLANEOUS) ×3 IMPLANT
PATTIES SURGICAL .5 X.5 (GAUZE/BANDAGES/DRESSINGS) IMPLANT
PATTIES SURGICAL .5 X1 (DISPOSABLE) ×1 IMPLANT
POSITIONER HEAD PRONE TRACH (MISCELLANEOUS) ×2 IMPLANT
PROBE BALL TIP NVM5 SNG USE (BALLOONS) ×1 IMPLANT
ROD RELINE COCR LORD 5X40MM (Rod) ×2 IMPLANT
SCREW LOCK RSS 4.5/5.0MM (Screw) ×4 IMPLANT
SCREW SHANK RELINE MOD 5.5X35 (Screw) ×4 IMPLANT
SPONGE SURGIFOAM ABS GEL 100 (HEMOSTASIS) ×1 IMPLANT
SPONGE T-LAP 4X18 ~~LOC~~+RFID (SPONGE) ×5 IMPLANT
STRIP CLOSURE SKIN 1/2X4 (GAUZE/BANDAGES/DRESSINGS) ×1 IMPLANT
SURGIFLO W/THROMBIN 8M KIT (HEMOSTASIS) IMPLANT
SUT BONE WAX W31G (SUTURE) ×2 IMPLANT
SUT MNCRL AB 3-0 PS2 18 (SUTURE) ×4 IMPLANT
SUT VIC AB 1 CT1 18XCR BRD 8 (SUTURE) ×1 IMPLANT
SUT VIC AB 1 CT1 8-18 (SUTURE) ×2
SUT VIC AB 1 CTX 36 (SUTURE)
SUT VIC AB 1 CTX36XBRD ANBCTR (SUTURE) IMPLANT
SUT VIC AB 2-0 CT1 18 (SUTURE) ×3 IMPLANT
SYR BULB IRRIG 60ML STRL (SYRINGE) ×2 IMPLANT
SYR CONTROL 10ML LL (SYRINGE) ×2 IMPLANT
TOWEL GREEN STERILE (TOWEL DISPOSABLE) ×2 IMPLANT
TOWEL GREEN STERILE FF (TOWEL DISPOSABLE) ×2 IMPLANT
TRAY FOLEY MTR SLVR 16FR STAT (SET/KITS/TRAYS/PACK) ×2 IMPLANT
WATER STERILE IRR 1000ML POUR (IV SOLUTION) ×1 IMPLANT
YANKAUER SUCT BULB TIP NO VENT (SUCTIONS) ×2 IMPLANT

## 2021-08-30 NOTE — Brief Op Note (Signed)
08/29/2021 - 08/30/2021  9:29 AM  PATIENT:  Tina Neal  71 y.o. female  PRE-OPERATIVE DIAGNOSIS:  Adjacent segment degenerative disc disease with radicular leg pain  POST-OPERATIVE DIAGNOSIS:  Adjacent segment degenerative disc disease with radicular leg pain  PROCEDURE:  Procedure(s) with comments: POSTERIOR LATERAL ARTHRODESIS LUMBAR TWO THROUGH FOUR. INSTRUMENTED POSTERIOR FUSION WITH PEDICAL SCREW LUMBAR TWO THROUGH THREE.  ALLOGRAFT BONE, DBX MIX (N/A) - 3.5 HRS 3 C-BED  SURGEON:  Surgeon(s) and Role:    Melina Schools, MD - Primary  PHYSICIAN ASSISTANT:   ASSISTANTS: Nelson Chimes, PA   ANESTHESIA:   general  EBL:  >50 cc   BLOOD ADMINISTERED:none  DRAINS: none   LOCAL MEDICATIONS USED:  MARCAINE     SPECIMEN:  No Specimen  DISPOSITION OF SPECIMEN:  N/A  COUNTS:  YES  TOURNIQUET:  * No tourniquets in log *  DICTATION: .Dragon Dictation  PLAN OF CARE: Admit to inpatient   PATIENT DISPOSITION:  PACU - hemodynamically stable.

## 2021-08-30 NOTE — Evaluation (Signed)
Occupational Therapy Evaluation Patient Details Name: Tina Neal MRN: [id number] DOB: 09/05/1950 Today's Date: 08/30/2021   History of Present Illness 71 y.o. female presents to [facility] on 08/29/2021 with degenerative disease at L2-4 along with back pain radiating into lower extremities. Pt underwent L2-4 lateral interbody fusions on 1/25. Pt underwent L2-3 posterior fixation on 08/30/2021. PMH includes OA, DMII, HLD, HTN.   Clinical Impression   Pt admitted for procedures listed above. PTA pt reported that she was independent with all ADL's and IADL's, including working as a PCA. At this time, pt is limited by pain and weakness, requiring increased assist for LB ADL's and min guard for seated ADL's. Overall, pt is moving well with a RW, no physical assist needed for functional mobility. OT will follow acutely to assist with maximizing her independence with ADL's prior to returning home.       Recommendations for follow up therapy are one component of a multi-disciplinary discharge planning process, led by the attending physician.  Recommendations may be updated based on patient status, additional functional criteria and insurance authorization.   Follow Up Recommendations  No OT follow up    Assistance Recommended at Discharge PRN  Patient can return home with the following A little help with bathing/dressing/bathroom    Functional Status Assessment  Patient has had a recent decline in their functional status and demonstrates the ability to make significant improvements in function in a reasonable and predictable amount of time.  Equipment Recommendations  BSC/3in1;Other (comment) (RW)    Recommendations for Other Services       Precautions / Restrictions Precautions Precautions: Fall;Back Precaution Booklet Issued: Yes (comment) Required Braces or Orthoses: Spinal Brace Spinal Brace: Lumbar corset;Applied in sitting position Restrictions Weight Bearing Restrictions: No       Mobility Bed Mobility Overal bed mobility: Needs Assistance Bed Mobility: Rolling, Sidelying to Sit, Sit to Sidelying Rolling: Supervision Sidelying to sit: Supervision     Sit to sidelying: Supervision      Transfers Overall transfer level: Needs assistance Equipment used: Rolling walker (2 wheels) Transfers: Sit to/from Stand Sit to Stand: Supervision                  Balance Overall balance assessment: Needs assistance Sitting-balance support: No upper extremity supported, Feet supported Sitting balance-Leahy Scale: Good     Standing balance support: Single extremity supported, Reliant on assistive device for balance Standing balance-Leahy Scale: Poor                             ADL either performed or assessed with clinical judgement   ADL Overall ADL's : Needs assistance/impaired Eating/Feeding: Set up;Sitting   Grooming: Min guard;Standing   Upper Body Bathing: Min guard;Sitting   Lower Body Bathing: Minimal assistance;Sitting/lateral leans;Sit to/from stand   Upper Body Dressing : Min guard;Sitting   Lower Body Dressing: Minimal assistance;Sitting/lateral leans;Sit to/from stand;Adhering to back precautions   Toilet Transfer: Min guard;Ambulation   Toileting- Clothing Manipulation and Hygiene: Min guard;Sitting/lateral lean;Sit to/from stand       Functional mobility during ADLs: Min guard;Rolling walker (2 wheels) General ADL Comments: Overall requiring assist due toweakness and pain     Vision Baseline Vision/History: 1 Wears glasses Ability to See in Adequate Light: 0 Adequate Patient Visual Report: No change from baseline Vision Assessment?: No apparent visual deficits     Perception     Praxis      Pertinent Vitals/Pain  Pain Assessment Pain Assessment: 0-10 Pain Score: 8  Pain Location: back Pain Descriptors / Indicators: Sore Pain Intervention(s): Monitored during session, Premedicated before session      Hand Dominance Right   Extremity/Trunk Assessment Upper Extremity Assessment Upper Extremity Assessment: Overall WFL for tasks assessed   Lower Extremity Assessment Lower Extremity Assessment: Overall WFL for tasks assessed   Cervical / Trunk Assessment Cervical / Trunk Assessment: Back Surgery   Communication Communication Communication: No difficulties   Cognition Arousal/Alertness: Awake/alert Behavior During Therapy: WFL for tasks assessed/performed Overall Cognitive Status: Within Functional Limits for tasks assessed                                       General Comments  VSS on RA, brace donned    Exercises     Shoulder Instructions      Home Living Family/patient expects to be discharged to:: Private residence Living Arrangements: Spouse/significant other Available Help at Discharge: Family;Available 24 hours/day Type of Home: House Home Access: Level entry     Home Layout: Two level Alternate Level Stairs-Number of Steps: 16 Alternate Level Stairs-Rails: Right Bathroom Shower/Tub: Occupational psychologist: Standard     Home Equipment: None          Prior Functioning/Environment Prior Level of Function : Independent/Modified Independent;Working/employed;Driving             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, chartered Problem List: Decreased strength;Decreased activity tolerance;Impaired balance (sitting and/or standing);Pain      OT Treatment/Interventions: Self-care/ADL training;Therapeutic exercise;Energy conservation;DME and/or AE instruction;Therapeutic activities;Patient/family education;Balance training    OT Goals(Current goals can be found in the care plan section) Acute Rehab OT Goals Patient Stated Goal: To relieve pain OT Goal Formulation: With patient Time For Goal Achievement: 09/13/21 Potential to Achieve Goals: Good ADL Goals Pt Will Perform Lower Body Bathing: with modified  independence;sitting/lateral leans;sit to/from stand Pt Will Perform Lower Body Dressing: with modified independence;sitting/lateral leans;sit to/from stand Pt Will Perform Toileting - Clothing Manipulation and hygiene: with modified independence;sitting/lateral leans;sit to/from stand Additional ADL Goal #1: Pt will follow 3/3 spinal precautions 100% of the time with independence Additional ADL Goal #2: Pt will complete bed mobility utilizing the log roll technique 100% of the time, independently.  OT Frequency: Min 2X/week    Co-evaluation              AM-PAC OT "6 Clicks" Daily Activity     Outcome Measure Help from another person eating meals?: None Help from another person taking care of personal grooming?: A Little Help from another person toileting, which includes using toliet, bedpan, or urinal?: A Little Help from another person bathing (including washing, rinsing, drying)?: A Little Help from another person to put on and taking off regular upper body clothing?: A Little Help from another person to put on and taking off regular lower body clothing?: A Little 6 Click Score: 19   End of Session Equipment Utilized During Treatment: Back brace;Rolling walker (2 wheels) Nurse Communication: Mobility status  Activity Tolerance: Patient tolerated treatment well Patient left: in chair;with call bell/phone within reach;with family/visitor present  OT Visit Diagnosis: Unsteadiness on feet (R26.81);Other abnormalities of gait and mobility (R26.89);Muscle weakness (generalized) (M62.81)                Time: 8938-1017 OT Time Calculation (min):  15 min Charges:  OT General Charges $OT Visit: 1 Visit OT Evaluation $OT Eval Moderate Complexity: 1 Mod  Tina Aguilera H., OTR/L Acute Rehabilitation  Tina Neal 08/30/2021, 5:54 PM

## 2021-08-30 NOTE — Discharge Instructions (Signed)

## 2021-08-30 NOTE — Anesthesia Procedure Notes (Signed)
Procedure Name: Intubation Date/Time: 08/30/2021 7:42 AM Performed by: Cathren Harsh, CRNA Pre-anesthesia Checklist: Patient identified, Emergency Drugs available, Suction available and Patient being monitored Patient Re-evaluated:Patient Re-evaluated prior to induction Oxygen Delivery Method: Circle System Utilized Preoxygenation: Pre-oxygenation with 100% oxygen Induction Type: IV induction Ventilation: Mask ventilation without difficulty Laryngoscope Size: Mac and 3 Grade View: Grade I Tube type: Oral Tube size: 7.0 mm Number of attempts: 1 Airway Equipment and Method: Stylet and Oral airway Placement Confirmation: ETT inserted through vocal cords under direct vision, positive ETCO2 and breath sounds checked- equal and bilateral Secured at: 21 cm Tube secured with: Tape Dental Injury: Teeth and Oropharynx as per pre-operative assessment

## 2021-08-30 NOTE — Evaluation (Signed)
Physical Therapy Evaluation Patient Details Name: Tina Neal MRN: [id number] DOB: Jan 13, 1951 Today's Date: 08/30/2021  History of Present Illness  71 y.o. female presents to [facility] hospital on 08/29/2021 with degenerative disease at L2-4 along with back pain radiating into lower extremities. Pt underwent L2-4 lateral interbody fusions on 1/25. Pt underwent L2-3 posterior fixation on 08/30/2021. PMH includes OA, DMII, HLD, HTN.  Clinical Impression  Pt presents to PT with deficits in gait, balance, power, activity tolerance. Pt mobilizes well, requiring verbal cues for back precautions and some assistance to steady during stair negotiation. Pt will benefit from frequent mobilization to aide in improving mobility quality. PT recommends pt receive a RW prior to discharge to aide in improving balance.       Recommendations for follow up therapy are one component of a multi-disciplinary discharge planning process, led by the attending physician.  Recommendations may be updated based on patient status, additional functional criteria and insurance authorization.  Follow Up Recommendations No PT follow up    Assistance Recommended at Discharge Intermittent Supervision/Assistance  Patient can return home with the following  Help with stairs or ramp for entrance    Equipment Recommendations Rolling walker (2 wheels)  Recommendations for Other Services       Functional Status Assessment Patient has had a recent decline in their functional status and demonstrates the ability to make significant improvements in function in a reasonable and predictable amount of time.     Precautions / Restrictions Precautions Precautions: Fall;Back Precaution Booklet Issued: Yes (comment) Required Braces or Orthoses: Spinal Brace Spinal Brace: Lumbar corset;Applied in sitting position Restrictions Weight Bearing Restrictions: No      Mobility  Bed Mobility Overal bed mobility: Needs Assistance Bed  Mobility: Rolling, Sidelying to Sit, Sit to Sidelying Rolling: Supervision Sidelying to sit: Supervision     Sit to sidelying: Supervision      Transfers Overall transfer level: Needs assistance Equipment used: Rolling walker (2 wheels) Transfers: Sit to/from Stand Sit to Stand: Supervision                Ambulation/Gait Ambulation/Gait assistance: Supervision Gait Distance (Feet): 150 Feet Assistive device: Rolling walker (2 wheels) Gait Pattern/deviations: Step-through pattern Gait velocity: reduced Gait velocity interpretation: <1.8 ft/sec, indicate of risk for recurrent falls   General Gait Details: pt with steady step-through gait  Stairs Stairs: Yes Stairs assistance: Min guard Stair Management: One rail Right, Step to pattern, Forwards Number of Stairs: 5    Wheelchair Mobility    Modified Rankin (Stroke Patients Only)       Balance Overall balance assessment: Needs assistance Sitting-balance support: No upper extremity supported, Feet supported Sitting balance-Leahy Scale: Good     Standing balance support: Single extremity supported, Reliant on assistive device for balance Standing balance-Leahy Scale: Poor                               Pertinent Vitals/Pain Pain Assessment Pain Assessment: 0-10 Pain Score: 8  Pain Location: back Pain Descriptors / Indicators: Sore Pain Intervention(s): Monitored during session    Home Living Family/patient expects to be discharged to:: Private residence Living Arrangements: Spouse/significant other Available Help at Discharge: Family;Available 24 hours/day Type of Home: House Home Access: Level entry     Alternate Level Stairs-Number of Steps: 16 Home Layout: Two level Home Equipment: None      Prior Function Prior Level of Function : Independent/Modified Independent;Working/employed;Driving  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         Extremity/Trunk Assessment   Upper Extremity Assessment Upper Extremity Assessment: Overall WFL for tasks assessed    Lower Extremity Assessment Lower Extremity Assessment: Overall WFL for tasks assessed    Cervical / Trunk Assessment Cervical / Trunk Assessment: Back Surgery  Communication   Communication: No difficulties  Cognition Arousal/Alertness: Awake/alert Behavior During Therapy: WFL for tasks assessed/performed Overall Cognitive Status: Within Functional Limits for tasks assessed                                          General Comments General comments (skin integrity, edema, etc.): VSS on RA    Exercises     Assessment/Plan    PT Assessment Patient needs continued PT services  PT Problem List Decreased activity tolerance;Decreased balance;Decreased mobility;Decreased knowledge of precautions       PT Treatment Interventions DME instruction;Gait training;Stair training;Functional mobility training;Therapeutic activities;Therapeutic exercise;Balance training;Patient/family education    PT Goals (Current goals can be found in the Care Plan section)  Acute Rehab PT Goals Patient Stated Goal: to return to independence PT Goal Formulation: With patient Time For Goal Achievement: 09/04/21 Potential to Achieve Goals: Good    Frequency Min 5X/week     Co-evaluation               AM-PAC PT "6 Clicks" Mobility  Outcome Measure Help needed turning from your back to your side while in a flat bed without using bedrails?: A Little Help needed moving from lying on your back to sitting on the side of a flat bed without using bedrails?: A Little Help needed moving to and from a bed to a chair (including a wheelchair)?: A Little Help needed standing up from a chair using your arms (e.g., wheelchair or bedside chair)?: A Little Help needed to walk in hospital room?: A Little Help needed climbing 3-5 steps with a railing? : A Little 6 Click  Score: 18    End of Session Equipment Utilized During Treatment: Back brace Activity Tolerance: Patient tolerated treatment well Patient left: in bed;with call bell/phone within reach;with family/visitor present Nurse Communication: Mobility status PT Visit Diagnosis: Other abnormalities of gait and mobility (R26.89)    Time: 5361-4431 PT Time Calculation (min) (ACUTE ONLY): 35 min   Charges:   PT Evaluation $PT Eval Low Complexity: 1 Low PT Treatments $Therapeutic Activity: 8-22 mins        Zenaida Niece, PT, DPT Acute Rehabilitation Pager: [phone] Office [phone]   Zenaida Niece 08/30/2021, 1:44 PM

## 2021-08-30 NOTE — Transfer of Care (Signed)
Immediate Anesthesia Transfer of Care Note  Patient: Tina Neal  Procedure(s) Performed: POSTERIOR LATERAL ARTHRODESIS LUMBAR TWO THROUGH FOUR. INSTRUMENTED POSTERIOR FUSION WITH PEDICAL SCREW LUMBAR TWO THROUGH THREE.  ALLOGRAFT BONE, DBX MIX (Spine Lumbar)  Patient Location: PACU  Anesthesia Type:General  Level of Consciousness: awake, drowsy, patient cooperative and responds to stimulation  Airway & Oxygen Therapy: Patient Spontanous Breathing and Patient connected to nasal cannula oxygen  Post-op Assessment: Report given to RN, Post -op Vital signs reviewed and stable and Patient moving all extremities X 4  Post vital signs: Reviewed and stable  Last Vitals:  Vitals Value Taken Time  BP 130/77 08/30/21 0956  Temp    Pulse 81 08/30/21 0957  Resp 14 08/30/21 0957  SpO2 100 % 08/30/21 0957  Vitals shown include unvalidated device data.  Last Pain:  Vitals:   08/30/21 0404  TempSrc: Oral  PainSc:       Patients Stated Pain Goal: 3 (29/51/88 4166)  Complications: No notable events documented.

## 2021-08-30 NOTE — Progress Notes (Signed)
° ° °  Subjective: Procedure(s) (LRB): POSTERIOR SPINALFUSION INTERBODY L2-4, EXTENSION OF L4-5 FUSION (N/A) Day of Surgery  Patient reports pain as 2 on 0-10 scale.  Reports decreased leg pain reports incisional back pain   Positive void Negative bowel movement Negative flatus Negative chest pain or shortness of breath  Objective: Vital signs in last 24 hours: Temp:  [97.8 F (36.6 C)-98.5 F (36.9 C)] 98.5 F (36.9 C) (01/26 0404) Pulse Rate:  [64-94] 83 (01/26 0404) Resp:  [12-18] 18 (01/26 0404) BP: (110-131)/(67-84) 117/71 (01/26 0404) SpO2:  [94 %-100 %] 95 % (01/26 0404)  Intake/Output from previous day: 01/25 0701 - 01/26 0700 In: 2170 [P.O.:120; I.V.:1800; IV Piggyback:250] Out: 350 [Urine:300; Blood:50]  Labs: Recent Labs    08/27/21 0933  WBC 7.5  RBC 4.41  HCT 37.8  PLT 226   Recent Labs    08/27/21 0933  NA 141  K 3.8  CL 103  CO2 27  BUN 10  CREATININE 0.82  GLUCOSE 99  CALCIUM 8.9   No results for input(s): LABPT, INR in the last 72 hours.  Physical Exam: Neurologically intact ABD soft Intact pulses distally Incision: dressing C/D/I and no drainage Compartment soft There is no height or weight on file to calculate BMI.   Assessment/Plan: Patient stable  xrays n/a Continue mobilization with physical therapy Continue care  Patient states that the preoperative neuropathic leg pain has resolved. She reports some incisional pain but overall she feels better. Surgical plan today: Posterior supplemental pedicle screw fixation at L2-3.  Since we are able to place the plate as a supplemental fixation at L3-4 yesterday I do not think there is any need to extend the fusion all the way to L4-5.  This will decrease her surgical time and overall morbidity since we do not need to make extensive surgical incisions.  I will also supplement the posterior pedicle screw fixation with allograft to improve the overall fusion rate. The patient expressed  understanding of the surgical plan and willingness to move forward.  Risks, benefits were discussed with the patient.  Melina Schools, MD [facility] [phone]

## 2021-08-30 NOTE — Op Note (Signed)
OPERATIVE REPORT  DATE OF SURGERY: 08/30/2021  PATIENT NAME:  Tina Neal MRN: [id number] DOB: May 07, 1951  PCP: Josetta Huddle, MD  PRE-OPERATIVE DIAGNOSIS: Status post lateral interbody fusion L2-3 L3-4 with lateral plating L3-4.  Previous L4-5 TLIF.  Lumbar spinal stenosis with degenerative disc disease L2-3-4.  POST-OPERATIVE DIAGNOSIS: Same  PROCEDURE:   Posterior arthrodesis L2-4. Posterior spinal fusion instrumentation L2-3 with cortical pedicle screws                     Allograft for fusion DBX mix 10 cc  SURGEON:  Melina Schools, MD  PHYSICIAN ASSISTANT: Nelson Chimes, PA  ANESTHESIA:   General  EBL: Less than 50 ml   Complications: None  Implants: NuVasive cortical pedicle screws 5.5 x 35 mm length.  40 mm length rod.  Graft: DBX mix  Monitoring: All 4 pedicle screws were directly stimulated.  Left side: L2: 35 mA.  L3: 20 mA.  Right side: L2: Greater than 40 mA.  L3: 30 mA  BRIEF HISTORY: Tina Neal is a 71 y.o. female who presented to my office several years after having a TLIF at L4-5.  Patient was having increasing back buttock and neuropathic leg pain.  Conservative management had failed to alleviate her pain and improve her quality of life.  Imaging confirmed adjacent segment degenerative disease L2-3 L3-4 and to a lesser degree L5-S1.  After discussing treatment options we elected to move forward with extending her fusion cephalad at L2-3 and L3-4.  All appropriate risks, benefits and alternatives to surgery were discussed and consent was obtained.  This was a planned two-stage surgical procedure.  The patient already had the lateral interbody fusion yesterday along with a lateral plate for supplemental stabilization at the L3-4 level.  Since they already had supplemental fixation at L3-4 I elected only to use the pedicle screws to supplement the L2-3 level.  But I did extend the posterior lateral arthrodesis down to the L4 level.  PROCEDURE  DETAILS: Patient was brought into the operating room and was properly positioned on the operating room table.  After induction with general anesthesia the patient was endotracheally intubated.  A timeout was taken to confirm all important data: including patient, procedure, and the level. Teds, SCD's were applied.   A Foley was inserted by the nurse, and the intraoperative neuro monitoring devices were applied by the representative.  The patient was turned prone onto the [facility] spine frame.  All bony prominences were well-padded and the back was prepped and draped in a standard fashion.  Using fluoroscopy I marked out the L2 and L3 pedicles.  I then marked out my midline incision and infiltrated the area with quarter percent Marcaine with epinephrine.  A midline incision was made and sharp dissection was carried out down to the deep fascia.  The deep fascia was sharply incised and I stripped the paraspinal muscles to expose the L2 and L3 spinous process along with the L1-2 and L2-3 facet complexes.  I also was able to identify the L3-4 facet complex.  Once I had the posterior approach complete I then placed my high-speed bur on the 7 o'clock position of the pedicle of L2 on the right side.  Imaging confirmed that I was the proper position and I created a hole in the cortical bone.  The pedicle awl was then placed and advanced using fluoroscopy.  I advanced starting at the 7 o'clock position and aiming towards the 1 o'clock position of the  pedicle.  As I neared the medial portion of the pedicle on the AP view I switched to the lateral view to confirm that my trajectory was appropriate.  I then advanced into the vertebral body.  I remove the pedicle all and then sounded the canal with a pedicle probe to ensure it was a solid bony canal.  I then tapped and then resounded the hole with a ball-tipped feeler.  The screw was then obtained and placed.  It had excellent purchase.  Using this exact same technique of  starting at the 7 o'clock position and aimed to the 1 o'clock position I placed a cortical pedicle screw at L3.  Once the right L2 and L3 screws were complete I then switched to place the left once.  At this time I used a 5:00 starting position on the L2 pedicle and advanced towards the 11 o'clock position.  I again used fluoroscopy to guide my all.  Once I confirm satisfactory position in both the AP and lateral planes I advanced into the vertebral body and then sounded the hole with a ball-tipped feeler.  I then tapped, sounded the hole with a ball-tipped feeler once again, and then placed the screws.  I repeated this at the L3 level.  With all 4 pedicle screws properly placed I then directly stimulated.  There was no adverse activity noted on any of the pedicle screws.  Abnormal EMG findings were noted anywhere ranging from 20 to greater than 40 mA well within the normal range.  At this point I decorticated the L2-3 and L3-4 facet capsules with a Bovie and high-speed bur.  I then placed bone graft into the posterior lateral gutter and over the facet complexes at L2-3 and L3-4.  The polyaxial heads were then attached to the screws and the rod was then placed.  The locking caps were inserted to secure the rods.  All 4 locking caps were then torqued according to manufacture standards.  The wound was then copiously irrigated with normal saline.  Retractors were removed and final x-rays demonstrated satisfactory position of the pedicle screw construct at L2-3 in both the AP and lateral planes.  There was no change to the lateral intervertebral cages or the lateral plate that was placed the day before.  The overall construct from L2-L5 was noted to be stable.  At this point the wound was closed in a layered fashion with interrupted 1 Vicryl suture, layer 2-0 interrupted Vicryl suture, and 3-0 Monocryl for the skin.  Steri-Strips and dry dressings were applied and the patient was ultimately extubated transfer the PACU  without incident.  The end of the case all needle and sponge counts were correct.  Melina Schools, MD 08/30/2021 9:17 AM

## 2021-08-30 NOTE — Anesthesia Postprocedure Evaluation (Signed)
Anesthesia Post Note  Patient: Tina Neal  Procedure(s) Performed: POSTERIOR LATERAL ARTHRODESIS LUMBAR TWO THROUGH FOUR. INSTRUMENTED POSTERIOR FUSION WITH PEDICAL SCREW LUMBAR TWO THROUGH THREE.  ALLOGRAFT BONE, DBX MIX (Spine Lumbar)     Patient location during evaluation: PACU Anesthesia Type: General Level of consciousness: sedated Pain management: pain level controlled Vital Signs Assessment: post-procedure vital signs reviewed and stable Respiratory status: spontaneous breathing and respiratory function stable Cardiovascular status: stable Postop Assessment: no apparent nausea or vomiting Anesthetic complications: no   No notable events documented.  Last Vitals:  Vitals:   08/30/21 1052 08/30/21 1104  BP: 109/70 110/66  Pulse: 75 73  Resp: 11 18  Temp: 36.8 C 36.6 C  SpO2: 93% 93%    Last Pain:  Vitals:   08/30/21 1104  TempSrc: Oral  PainSc: 2                  Darby Fleeman DANIEL

## 2021-08-30 NOTE — Anesthesia Procedure Notes (Signed)
Anesthesia Regional Block: Adductor canal block   Pre-Anesthetic Checklist: , timeout performed,  Correct Patient, Correct Site, Correct Laterality,  Correct Procedure, Correct Position, site marked,  Risks and benefits discussed,  Surgical consent,  Pre-op evaluation,  At surgeon's request and post-op pain management  Laterality: Left  Prep: chloraprep       Needles:  Injection technique: Single-shot  Needle Type: Stimulator Needle - 80     Needle Length: 10cm  Needle Gauge: 21     Additional Needles:   Narrative:  Start time: 08/30/2021 6:54 AM End time: 08/30/2021 7:04 AM Injection made incrementally with aspirations every 5 mL.  Performed by: Personally  Anesthesiologist: Duane Boston, MD

## 2021-08-31 LAB — GLUCOSE, CAPILLARY
Glucose-Capillary: 113 mg/dL — ABNORMAL HIGH (ref 70–99)
Glucose-Capillary: 82 mg/dL (ref 70–99)

## 2021-08-31 LAB — HEMOGLOBIN A1C
Hgb A1c MFr Bld: 6.5 % — ABNORMAL HIGH (ref 4.8–5.6)
Mean Plasma Glucose: 140 mg/dL

## 2021-08-31 MED ORDER — SORBITOL 70 % SOLN
30.0000 mL | Freq: Every day | Status: DC | PRN
  Administered 2021-08-31: 30 mL via ORAL
  Filled 2021-08-31: qty 30

## 2021-08-31 NOTE — Progress Notes (Signed)
Physical Therapy Treatment Patient Details Name: Tina Neal MRN: [id number] DOB: 1951/06/07 Today's Date: 08/31/2021   History of Present Illness 71 y.o. female presents to [facility] hospital on 08/29/2021 with degenerative disease at L2-4 along with back pain radiating into lower extremities. Pt underwent L2-4 lateral interbody fusions on 1/25. Pt underwent L2-3 posterior fixation on 08/30/2021. PMH includes OA, DMII, HLD, HTN.    PT Comments    Pt tolerates treatment well, ambulating without the use of an assistive device. Pt's gait speed and stride length are reduced and mobility is more labored without the support of a walker at this time. Pt will benefit from receiving a RW to discharge home with to improve comfort with early post-surgical mobility.   Recommendations for follow up therapy are one component of a multi-disciplinary discharge planning process, led by the attending physician.  Recommendations may be updated based on patient status, additional functional criteria and insurance authorization.  Follow Up Recommendations  No PT follow up     Assistance Recommended at Discharge Intermittent Supervision/Assistance  Patient can return home with the following Help with stairs or ramp for entrance   Equipment Recommendations  Rolling walker (2 wheels)    Recommendations for Other Services       Precautions / Restrictions Precautions Precautions: Fall;Back Precaution Booklet Issued: Yes (comment) Required Braces or Orthoses: Spinal Brace Spinal Brace: Lumbar corset;Applied in sitting position Restrictions Weight Bearing Restrictions: No     Mobility  Bed Mobility Overal bed mobility: Modified Independent Bed Mobility: Rolling, Sidelying to Sit, Sit to Sidelying Rolling: Modified independent (Device/Increase time) Sidelying to sit: Modified independent (Device/Increase time)     Sit to sidelying: Modified independent (Device/Increase time)      Transfers Overall  transfer level: Needs assistance Equipment used: None Transfers: Sit to/from Stand Sit to Stand: Supervision                Ambulation/Gait Ambulation/Gait assistance: Supervision Gait Distance (Feet): 200 Feet Assistive device: None Gait Pattern/deviations: Step-through pattern Gait velocity: reduced Gait velocity interpretation: <1.8 ft/sec, indicate of risk for recurrent falls   General Gait Details: pt with slowed step-through gait, reduced stride length   Stairs Stairs: Yes Stairs assistance: Min guard Stair Management: One rail Right, Step to pattern, Forwards Number of Stairs: 8     Wheelchair Mobility    Modified Rankin (Stroke Patients Only)       Balance Overall balance assessment: Needs assistance Sitting-balance support: No upper extremity supported, Feet supported Sitting balance-Leahy Scale: Good     Standing balance support: No upper extremity supported, During functional activity Standing balance-Leahy Scale: Good                              Cognition Arousal/Alertness: Awake/alert Behavior During Therapy: WFL for tasks assessed/performed Overall Cognitive Status: Within Functional Limits for tasks assessed                                          Exercises      General Comments General comments (skin integrity, edema, etc.): VSS on RA      Pertinent Vitals/Pain Pain Assessment Pain Assessment: Faces Faces Pain Scale: Hurts little more Pain Location: back Pain Descriptors / Indicators: Sore Pain Intervention(s): Monitored during session    Home Living  Prior Function            PT Goals (current goals can now be found in the care plan section) Acute Rehab PT Goals Patient Stated Goal: to return to independence Progress towards PT goals: Progressing toward goals    Frequency    Min 5X/week      PT Plan Current plan remains appropriate     Co-evaluation              AM-PAC PT "6 Clicks" Mobility   Outcome Measure  Help needed turning from your back to your side while in a flat bed without using bedrails?: None Help needed moving from lying on your back to sitting on the side of a flat bed without using bedrails?: None Help needed moving to and from a bed to a chair (including a wheelchair)?: A Little Help needed standing up from a chair using your arms (e.g., wheelchair or bedside chair)?: A Little Help needed to walk in hospital room?: A Little Help needed climbing 3-5 steps with a railing? : A Little 6 Click Score: 20    End of Session Equipment Utilized During Treatment: Back brace Activity Tolerance: Patient tolerated treatment well Patient left: in bed;with call bell/phone within reach Nurse Communication: Mobility status PT Visit Diagnosis: Other abnormalities of gait and mobility (R26.89)     Time: 8616-8372 PT Time Calculation (min) (ACUTE ONLY): 16 min  Charges:  $Gait Training: 8-22 mins                     Zenaida Niece, PT, DPT Acute Rehabilitation Pager: [phone] Office [phone]    Zenaida Niece 08/31/2021, 9:55 AM

## 2021-08-31 NOTE — Progress Notes (Signed)
Patient awaiting transport via wheelchair by volunteer for discharge home; in no acute distress nor complaints of pain nor discomfort; incision on her left flank and back with honeycomb dressing and were clean, dry and intact; room was checked and accounted for all her belongings; discharge instructions concerning her medications, incision care, follow up appointment and when to call the doctor as needed were all discussed with patient by RN and she expressed understanding on the instructions given.

## 2021-08-31 NOTE — Progress Notes (Signed)
Subjective: 1 Day Post-Op Procedure(s) (LRB): POSTERIOR LATERAL ARTHRODESIS LUMBAR TWO THROUGH FOUR. INSTRUMENTED POSTERIOR FUSION WITH PEDICAL SCREW LUMBAR TWO THROUGH THREE.  ALLOGRAFT BONE, DBX MIX (N/A) Patient reports pain as mild.   Leg pain improved. Main complain is belly pain, no BM, no flatus.  +void +ambulation No CP, SOB.   Objective: Vital signs in last 24 hours: Temp:  [97.9 F (36.6 C)-99.2 F (37.3 C)] 98.1 F (36.7 C) (01/27 0807) Pulse Rate:  [73-98] 88 (01/27 0807) Resp:  [11-20] 16 (01/27 0807) BP: (95-132)/(60-91) 98/64 (01/27 0807) SpO2:  [93 %-100 %] 98 % (01/27 0807)  Intake/Output from previous day: 01/26 0701 - 01/27 0700 In: 950 [I.V.:750; IV Piggyback:200] Out: 950 [Urine:850; Blood:100] Intake/Output this shift: No intake/output data recorded.  No results for input(s): HGB in the last 72 hours. No results for input(s): WBC, RBC, HCT, PLT in the last 72 hours. No results for input(s): NA, K, CL, CO2, BUN, CREATININE, GLUCOSE, CALCIUM in the last 72 hours. No results for input(s): LABPT, INR in the last 72 hours.  Alert and oriented 3 Incision: dressing C/D/I. No draingae Neuropathic leg pain improved. Dorsiflexion/plantarflexion in tact. Distal sensation intact bilaterally. Palpable DP pulses.   Assessment/Plan: 1 Day Post-Op Procedure(s) (LRB): POSTERIOR LATERAL ARTHRODESIS LUMBAR TWO THROUGH FOUR. INSTRUMENTED POSTERIOR FUSION WITH PEDICAL SCREW LUMBAR TWO THROUGH THREE.  ALLOGRAFT BONE, DBX MIX (N/A) Advance diet Up with therapy LSO when OOB She got a lot of meds to stimulate BM this am. We will see if this helps, if  not we may try an enema later today. Encouraged PO fluids, ambulation, fiber. IS encouraged DVT Ppx: Teds, SCDs, ambulation  May D/C today if she passes PT/OT and has a BM, if not likely D/C tomorrow.   Meds and AVS printed in chart.  F/u: 2 weeks with Dr. Valora Corporal 08/31/2021, 8:40 AM

## 2021-08-31 NOTE — Discharge Summary (Signed)
Patient ID: Tina Neal MRN: [id number] DOB/AGE: Sep 17, 1950 71 y.o.  Admit date: 08/29/2021 Discharge date: 08/31/2021  Admission Diagnoses:  Principal Problem:   Spinal stenosis Active Problems:   S/P lumbar fusion   Discharge Diagnoses:  Principal Problem:   Spinal stenosis Active Problems:   S/P lumbar fusion  status post Procedure(s): POSTERIOR LATERAL ARTHRODESIS LUMBAR TWO THROUGH FOUR. INSTRUMENTED POSTERIOR FUSION WITH PEDICAL SCREW LUMBAR TWO THROUGH THREE.  ALLOGRAFT BONE, DBX MIX  Past Medical History:  Diagnosis Date   Arthritis    Asthma    Diabetes mellitus without complication (HCC)    GERD (gastroesophageal reflux disease)    Hyperlipidemia    Hypertension    Hypothyroidism    Pancreatitis    history    Surgeries: Procedure(s): POSTERIOR LATERAL ARTHRODESIS LUMBAR TWO THROUGH FOUR. INSTRUMENTED POSTERIOR FUSION WITH PEDICAL SCREW LUMBAR TWO THROUGH THREE.  ALLOGRAFT BONE, DBX MIX on 08/30/2021   Consultants:   Discharged Condition: Improved  Hospital Course: Tina Neal is an 71 y.o. female who was admitted 08/29/2021 for operative treatment of Spinal stenosis. Patient failed conservative treatments (please see the history and physical for the specifics) and had severe unremitting pain that affects sleep, daily activities and work/hobbies. After pre-op clearance, the patient was taken to the operating room on 08/30/2021 and underwent  Procedure(s): [place]. INSTRUMENTED POSTERIOR FUSION WITH PEDICAL SCREW LUMBAR TWO THROUGH THREE.  ALLOGRAFT BONE, DBX MIX.    Patient was given perioperative antibiotics:  Anti-infectives (From admission, onward)    Start     Dose/Rate Route Frequency Ordered Stop   08/30/21 1200  ceFAZolin (ANCEF) IVPB 1 g/50 mL premix        1 g 100 mL/hr over 30 Minutes Intravenous Every 8 hours 08/30/21 1103 08/30/21 2030   08/30/21 0727  vancomycin (VANCOCIN) 1-5 GM/200ML-%  IVPB       Note to Pharmacy: Ladoris Gene A: cabinet override      08/30/21 0727 08/30/21 1944   08/29/21 1530  ceFAZolin (ANCEF) IVPB 1 g/50 mL premix        1 g 100 mL/hr over 30 Minutes Intravenous Every 8 hours 08/29/21 1228 08/29/21 2321   08/29/21 0552  vancomycin (VANCOCIN) IVPB 1000 mg/200 mL premix        1,000 mg 200 mL/hr over 60 Minutes Intravenous 60 min pre-op 08/29/21 0552 08/29/21 0750   08/29/21 0552  vancomycin (VANCOCIN) IVPB 1000 mg/200 mL premix  Status:  Discontinued        1,000 mg 200 mL/hr over 60 Minutes Intravenous 60 min pre-op 08/29/21 7062 08/29/21 3762        Patient was given sequential compression devices and early ambulation to prevent DVT.   Patient benefited maximally from hospital stay and there were no complications. At the time of discharge, the patient was urinating/moving their bowels without difficulty, tolerating a regular diet, pain is controlled with oral pain medications and they have been cleared by PT/OT.   Recent vital signs: Patient Vitals for the past 24 hrs:  BP Temp Temp src Pulse Resp SpO2  08/31/21 0845 -- -- -- -- -- 98 %  08/31/21 0807 98/64 98.1 F (36.7 C) Oral 88 16 98 %  08/31/21 0350 112/73 98.3 F (36.8 C) Oral 98 18 96 %  08/30/21 2349 98/65 99.2 F (37.3 C) Oral 95 18 97 %  08/30/21 2002 (!) 132/91 98.7 F (37.1 C) Oral 88 20 100 %  Recent laboratory studies: No results for input(s): WBC, HGB, HCT, PLT, NA, K, CL, CO2, BUN, CREATININE, GLUCOSE, INR, CALCIUM in the last 72 hours.  Invalid input(s): PT, 2   Discharge Medications:   Allergies as of 08/31/2021       Reactions   Maxidex [dexamethasone] Photosensitivity   Hctz [hydrochlorothiazide] Rash        Medication List     STOP taking these medications    albuterol 108 (90 Base) MCG/ACT inhaler Commonly known as: VENTOLIN HFA   CALCIUM 600 + D PO   cholecalciferol 25 MCG (1000 UNIT) tablet Commonly known as: VITAMIN D3   diclofenac sodium  1 % Gel Commonly known as: VOLTAREN   erythromycin ophthalmic ointment   fluticasone 50 MCG/ACT nasal spray Commonly known as: FLONASE   meloxicam 15 MG tablet Commonly known as: MOBIC   meloxicam 7.5 MG tablet Commonly known as: MOBIC   sulfamethoxazole-trimethoprim 800-160 MG tablet Commonly known as: BACTRIM DS       TAKE these medications    aspirin EC 81 MG tablet Take 81 mg by mouth daily. Swallow whole.   atorvastatin 10 MG tablet Commonly known as: LIPITOR Take 10 mg by mouth every Monday, Wednesday, and Friday.   budesonide-formoterol 160-4.5 MCG/ACT inhaler Commonly known as: SYMBICORT Inhale 2 puffs into the lungs 2 (two) times daily.   gabapentin 300 MG capsule Commonly known as: NEURONTIN Take 300 mg by mouth 3 (three) times daily.   levothyroxine 75 MCG tablet Commonly known as: SYNTHROID Take 75 mcg by mouth daily before breakfast.   metFORMIN 500 MG tablet Commonly known as: GLUCOPHAGE Take 250 mg by mouth daily.   methocarbamol 500 MG tablet Commonly known as: Robaxin Take 1 tablet (500 mg total) by mouth every 8 (eight) hours as needed for up to 5 days for muscle spasms. What changed:  how much to take when to take this   montelukast 10 MG tablet Commonly known as: SINGULAIR Take 10 mg by mouth daily.   ondansetron 4 MG tablet Commonly known as: Zofran Take 1 tablet (4 mg total) by mouth every 8 (eight) hours as needed for nausea or vomiting.   oxyCODONE-acetaminophen 10-325 MG tablet Commonly known as: Percocet Take 1 tablet by mouth every 6 (six) hours as needed for up to 5 days for pain.   pantoprazole 40 MG tablet Commonly known as: PROTONIX Take 40 mg by mouth daily.   spironolactone 25 MG tablet Commonly known as: ALDACTONE Take 25 mg by mouth 2 (two) times daily.   vitamin B-12 1000 MCG tablet Commonly known as: CYANOCOBALAMIN Take 1,000 mcg by mouth daily.               Durable Medical Equipment  (From  admission, onward)           Start     Ordered   08/31/21 1225  For home use only DME Walker rolling  Once       Question Answer Comment  Walker: With 5 Inch Wheels   Patient needs a walker to treat with the following condition Unsteady gait when walking      08/31/21 1224   08/31/21 1225  For home use only DME 3 n 1  Once        08/31/21 1224            Diagnostic Studies: DG Lumbar Spine 2-3 Views  Result Date: 08/30/2021 CLINICAL DATA:  L2-3 posterior fusion. EXAM: LUMBAR SPINE - 2-3 VIEW  COMPARISON:  C-arm radiographs obtained yesterday. FINDINGS: Interval pedicle screw and rod fusion at the L2-3 level with stable mild retrolisthesis. Previous interbody fusion at that level as well as hardware fusion at the L3-4 and L4-5 levels. IMPRESSION: Operative changes, as described above. Electronically Signed   By: Claudie Revering M.D.   On: 08/30/2021 11:23   DG Lumbar Spine 2-3 Views  Result Date: 08/29/2021 CLINICAL DATA:  Surgical fusion of L2-3 and L3-4 EXAM: LUMBAR SPINE - 2-3 VIEW; DG C-ARM 1-60 MIN-NO REPORT Radiation exposure index: 159.25 mGy. COMPARISON:  February 13, 2016. FINDINGS: Three intraoperative fluoroscopic images were obtained of the lumbar spine. These images demonstrate previous surgical posterior fusion of L4-5. Interval interbody fusion of L2-3 and L3-4 is noted. IMPRESSION: Fluoroscopic guidance provided during surgical fusion of L2-3 and L3-4. Electronically Signed   By: Marijo Conception M.D.   On: 08/29/2021 10:55   DG C-Arm 1-60 Min-No Report  Result Date: 08/30/2021 Fluoroscopy was utilized by the requesting physician.  No radiographic interpretation.   DG C-Arm 1-60 Min-No Report  Result Date: 08/30/2021 Fluoroscopy was utilized by the requesting physician.  No radiographic interpretation.   DG C-Arm 1-60 Min-No Report  Result Date: 08/29/2021 Fluoroscopy was utilized by the requesting physician.  No radiographic interpretation.   DG C-Arm 1-60 Min-No  Report  Result Date: 08/29/2021 Fluoroscopy was utilized by the requesting physician.  No radiographic interpretation.   DG C-Arm 1-60 Min-No Report  Result Date: 08/29/2021 Fluoroscopy was utilized by the requesting physician.  No radiographic interpretation.    Discharge Instructions     Incentive spirometry RT   Complete by: As directed    Incentive spirometry RT   Complete by: As directed         Follow-up Information     Melina Schools, MD. Schedule an appointment as soon as possible for a visit in 2 week(s).   Specialty: Orthopedic Surgery Why: If symptoms worsen, For suture removal, For wound re-check Contact information: [address] [place] [address] [phone]                 Discharge Plan:  discharge to home  Disposition: stable    Signed: Charlyne Petrin for [facility] PA-C [facility] [phone] 08/31/2021, 5:21 PM

## 2021-08-31 NOTE — Progress Notes (Signed)
Occupational Therapy Treatment Patient Details Name: Tina Neal MRN: [id number] DOB: 1950/10/06 Today's Date: 08/31/2021   History of present illness 71 y.o. female presents to [facility] hospital on 08/29/2021 with degenerative disease at L2-4 along with back pain radiating into lower extremities. Pt underwent L2-4 lateral interbody fusions on 1/25. Pt underwent L2-3 posterior fixation on 08/30/2021. PMH includes OA, DMII, HLD, HTN.   OT comments  Pt making incremental progress with OT goals this session. Limited by pain and weakness, pt is requiring increased assist for LB ADL's. Pt provided further education on compensatory strategies with ADL's and functional mobility. OT will continue to follow acutely.    Recommendations for follow up therapy are one component of a multi-disciplinary discharge planning process, led by the attending physician.  Recommendations may be updated based on patient status, additional functional criteria and insurance authorization.    Follow Up Recommendations  No OT follow up    Assistance Recommended at Discharge PRN  Patient can return home with the following  A little help with bathing/dressing/bathroom   Equipment Recommendations  BSC/3in1;Other (comment)    Recommendations for Other Services      Precautions / Restrictions Precautions Precautions: Fall;Back Precaution Booklet Issued: Yes (comment) Required Braces or Orthoses: Spinal Brace Spinal Brace: Lumbar corset;Applied in sitting position Restrictions Weight Bearing Restrictions: No       Mobility Bed Mobility Overal bed mobility: Modified Independent Bed Mobility: Rolling, Sidelying to Sit, Sit to Sidelying Rolling: Modified independent (Device/Increase time) Sidelying to sit: Modified independent (Device/Increase time)     Sit to sidelying: Modified independent (Device/Increase time)      Transfers Overall transfer level: Needs assistance Equipment used: None Transfers: Sit  to/from Stand Sit to Stand: Supervision                 Balance Overall balance assessment: Needs assistance Sitting-balance support: No upper extremity supported, Feet supported Sitting balance-Leahy Scale: Good     Standing balance support: No upper extremity supported, During functional activity Standing balance-Leahy Scale: Good                             ADL either performed or assessed with clinical judgement   ADL Overall ADL's : Needs assistance/impaired             Lower Body Bathing: Min guard;Sitting/lateral leans;Sit to/from stand Lower Body Bathing Details (indicate cue type and reason): completed in bathroom Upper Body Dressing : Independent;Sitting Upper Body Dressing Details (indicate cue type and reason): doffed hospital gown, donned tshirt Lower Body Dressing: Minimal assistance;Sitting/lateral leans;Sit to/from stand;Adhering to back precautions Lower Body Dressing Details (indicate cue type and reason): Donned underwear, pants, and slippers, unable to get over her feet without assist. Toilet Transfer: Min guard;Ambulation Toilet Transfer Details (indicate cue type and reason): to bathroom and back Toileting- Clothing Manipulation and Hygiene: Supervision/safety;Sitting/lateral lean;Sit to/from stand Toileting - Clothing Manipulation Details (indicate cue type and reason): on toilet in bathroom, BSC over toilet     Functional mobility during ADLs: Min guard;Rolling walker (2 wheels) General ADL Comments: Pt requiring increased assist for LB dressing due to weakness and pain. Adhering well to spinal precautions.    Extremity/Trunk Assessment              Vision       Perception     Praxis      Cognition Arousal/Alertness: Awake/alert Behavior During Therapy: WFL for tasks assessed/performed Overall Cognitive  Status: Within Functional Limits for tasks assessed                                           Exercises      Shoulder Instructions       General Comments VSS on RA, all honeycomb dressings intact    Pertinent Vitals/ Pain       Pain Assessment Pain Assessment: Faces Faces Pain Scale: Hurts little more Pain Location: back Pain Descriptors / Indicators: Sore Pain Intervention(s): Monitored during session  Home Living                                          Prior Functioning/Environment              Frequency  Min 2X/week        Progress Toward Goals  OT Goals(current goals can now be found in the care plan section)  Progress towards OT goals: Progressing toward goals  Acute Rehab OT Goals Patient Stated Goal: To go home OT Goal Formulation: With patient Time For Goal Achievement: 09/13/21 Potential to Achieve Goals: Good ADL Goals Pt Will Perform Lower Body Bathing: with modified independence;sitting/lateral leans;sit to/from stand Pt Will Perform Lower Body Dressing: with modified independence;sitting/lateral leans;sit to/from stand Pt Will Perform Toileting - Clothing Manipulation and hygiene: with modified independence;sitting/lateral leans;sit to/from stand Additional ADL Goal #1: Pt will follow 3/3 spinal precautions 100% of the time with independence Additional ADL Goal #2: Pt will complete bed mobility utilizing the log roll technique 100% of the time, independently.  Plan Discharge plan remains appropriate;Frequency remains appropriate    Co-evaluation                 AM-PAC OT "6 Clicks" Daily Activity     Outcome Measure   Help from another person eating meals?: None Help from another person taking care of personal grooming?: A Little Help from another person toileting, which includes using toliet, bedpan, or urinal?: A Little Help from another person bathing (including washing, rinsing, drying)?: A Little Help from another person to put on and taking off regular upper body clothing?: A Little Help from another  person to put on and taking off regular lower body clothing?: A Little 6 Click Score: 19    End of Session Equipment Utilized During Treatment: Back brace;Rolling walker (2 wheels)  OT Visit Diagnosis: Unsteadiness on feet (R26.81);Other abnormalities of gait and mobility (R26.89);Muscle weakness (generalized) (M62.81)   Activity Tolerance Patient tolerated treatment well   Patient Left in bed;with call bell/phone within reach   Nurse Communication Mobility status        Time: 7026-3785 OT Time Calculation (min): 23 min  Charges: OT General Charges $OT Visit: 1 Visit OT Treatments $Self Care/Home Management : 23-37 mins  Kalla Watson H., OTR/L Acute Rehabilitation  Madelynn Malson Elane Yolanda Bonine 08/31/2021, 12:19 PM

## 2021-08-31 NOTE — Plan of Care (Signed)

## 2021-09-01 LAB — BPAM RBC
Blood Product Expiration Date: 202303052359
Blood Product Expiration Date: 202303052359
Unit Type and Rh: 5100
Unit Type and Rh: 600

## 2021-09-01 LAB — TYPE AND SCREEN
ABO/RH(D): A POS
Antibody Screen: NEGATIVE
Donor AG Type: NEGATIVE
Donor AG Type: NEGATIVE
Unit division: 0
Unit division: 0

## 2021-09-10 DIAGNOSIS — I10 Essential (primary) hypertension: Secondary | ICD-10-CM | POA: Diagnosis not present

## 2021-09-10 DIAGNOSIS — J440 Chronic obstructive pulmonary disease with acute lower respiratory infection: Secondary | ICD-10-CM | POA: Diagnosis not present

## 2021-09-10 DIAGNOSIS — E1169 Type 2 diabetes mellitus with other specified complication: Secondary | ICD-10-CM | POA: Diagnosis not present

## 2021-09-10 DIAGNOSIS — E7800 Pure hypercholesterolemia, unspecified: Secondary | ICD-10-CM | POA: Diagnosis not present

## 2021-09-14 ENCOUNTER — Ambulatory Visit: Payer: Self-pay | Admitting: Orthopedic Surgery

## 2021-10-11 DIAGNOSIS — M545 Low back pain, unspecified: Secondary | ICD-10-CM | POA: Diagnosis not present

## 2021-10-15 DIAGNOSIS — M25512 Pain in left shoulder: Secondary | ICD-10-CM | POA: Diagnosis not present

## 2021-10-15 DIAGNOSIS — M545 Low back pain, unspecified: Secondary | ICD-10-CM | POA: Diagnosis not present

## 2021-10-16 DIAGNOSIS — Z4889 Encounter for other specified surgical aftercare: Secondary | ICD-10-CM | POA: Diagnosis not present

## 2021-10-23 DIAGNOSIS — M545 Low back pain, unspecified: Secondary | ICD-10-CM | POA: Diagnosis not present

## 2021-10-25 DIAGNOSIS — M545 Low back pain, unspecified: Secondary | ICD-10-CM | POA: Diagnosis not present

## 2021-11-01 DIAGNOSIS — M545 Low back pain, unspecified: Secondary | ICD-10-CM | POA: Diagnosis not present

## 2021-11-02 DIAGNOSIS — E1143 Type 2 diabetes mellitus with diabetic autonomic (poly)neuropathy: Secondary | ICD-10-CM | POA: Diagnosis not present

## 2021-11-26 DIAGNOSIS — E782 Mixed hyperlipidemia: Secondary | ICD-10-CM | POA: Diagnosis not present

## 2021-11-26 DIAGNOSIS — K219 Gastro-esophageal reflux disease without esophagitis: Secondary | ICD-10-CM | POA: Diagnosis not present

## 2021-11-26 DIAGNOSIS — E1143 Type 2 diabetes mellitus with diabetic autonomic (poly)neuropathy: Secondary | ICD-10-CM | POA: Diagnosis not present

## 2021-11-26 DIAGNOSIS — E039 Hypothyroidism, unspecified: Secondary | ICD-10-CM | POA: Diagnosis not present

## 2021-11-26 DIAGNOSIS — Z1331 Encounter for screening for depression: Secondary | ICD-10-CM | POA: Diagnosis not present

## 2021-11-26 DIAGNOSIS — Z0000 Encounter for general adult medical examination without abnormal findings: Secondary | ICD-10-CM | POA: Diagnosis not present

## 2021-11-26 DIAGNOSIS — I700 Atherosclerosis of aorta: Secondary | ICD-10-CM | POA: Diagnosis not present

## 2021-11-26 DIAGNOSIS — H6122 Impacted cerumen, left ear: Secondary | ICD-10-CM | POA: Diagnosis not present

## 2021-11-26 DIAGNOSIS — I10 Essential (primary) hypertension: Secondary | ICD-10-CM | POA: Diagnosis not present

## 2021-11-26 DIAGNOSIS — M5416 Radiculopathy, lumbar region: Secondary | ICD-10-CM | POA: Diagnosis not present

## 2021-11-26 DIAGNOSIS — J4520 Mild intermittent asthma, uncomplicated: Secondary | ICD-10-CM | POA: Diagnosis not present

## 2021-11-26 DIAGNOSIS — H6121 Impacted cerumen, right ear: Secondary | ICD-10-CM | POA: Diagnosis not present

## 2021-11-26 DIAGNOSIS — E559 Vitamin D deficiency, unspecified: Secondary | ICD-10-CM | POA: Diagnosis not present

## 2021-11-26 DIAGNOSIS — E7800 Pure hypercholesterolemia, unspecified: Secondary | ICD-10-CM | POA: Diagnosis not present

## 2021-11-27 DIAGNOSIS — Z4889 Encounter for other specified surgical aftercare: Secondary | ICD-10-CM | POA: Diagnosis not present

## 2021-12-20 ENCOUNTER — Encounter (HOSPITAL_BASED_OUTPATIENT_CLINIC_OR_DEPARTMENT_OTHER): Payer: Self-pay | Admitting: Physical Therapy

## 2021-12-20 ENCOUNTER — Ambulatory Visit (HOSPITAL_BASED_OUTPATIENT_CLINIC_OR_DEPARTMENT_OTHER): Payer: Medicare HMO | Attending: Orthopedic Surgery | Admitting: Physical Therapy

## 2021-12-20 DIAGNOSIS — R262 Difficulty in walking, not elsewhere classified: Secondary | ICD-10-CM | POA: Insufficient documentation

## 2021-12-20 DIAGNOSIS — M6281 Muscle weakness (generalized): Secondary | ICD-10-CM | POA: Diagnosis not present

## 2021-12-20 DIAGNOSIS — M5459 Other low back pain: Secondary | ICD-10-CM | POA: Diagnosis not present

## 2021-12-20 DIAGNOSIS — Z4889 Encounter for other specified surgical aftercare: Secondary | ICD-10-CM | POA: Insufficient documentation

## 2021-12-20 NOTE — Therapy (Addendum)
OUTPATIENT PHYSICAL THERAPY THORACOLUMBAR EVALUATION   Patient Name: SANARI OFFNER MRN: [id number] DOB:Aug 22, 1950, 71 y.o., female Today's Date: 12/20/2021   PT End of Session - 12/20/21 1436     Visit Number 1    Number of Visits 19    Date for PT Re-Evaluation 03/20/22    Authorization Type [employer] MCR    PT Start Time 1430    PT Stop Time 1515    PT Time Calculation (min) 45 min    Activity Tolerance Patient tolerated treatment well;Patient limited by pain    Behavior During Therapy WFL for tasks assessed/performed             Past Medical History:  Diagnosis Date   Arthritis    Asthma    Diabetes mellitus without complication ([place])    GERD (gastroesophageal reflux disease)    Hyperlipidemia    Hypertension    Hypothyroidism    Pancreatitis    history   Past Surgical History:  Procedure Laterality Date   ABDOMINAL HYSTERECTOMY     ANTERIOR LAT LUMBAR FUSION N/A 08/29/2021   Procedure: EXTREME LATERAL INTERBODY FUSION LUMBAR TWO THROUGH FOUR;  Surgeon: Melina Schools, MD;  Location: [place];  Service: Orthopedics;  Laterality: N/A;  3.5 hrs left tap block with exparel 3 C-Bed   BACK SURGERY  2011   lumbar fusion   CALCANEAL OSTEOTOMY Left 10/28/2013   Procedure: LEFT CALCANEAL OSTEOTOMY;  Surgeon: Wylene Simmer, MD;  Location: [facility];  Service: Orthopedics;  Laterality: Left;   CHOLECYSTECTOMY  2009   GASTROC RECESSION EXTREMITY Left 10/28/2013   Procedure: LEFT GASTROCNEMIUS RECESSION ;  Surgeon: Wylene Simmer, MD;  Location: [facility];  Service: Orthopedics;  Laterality: Left;   JOINT REPLACEMENT     bilat total knees   KNEE SURGERY     right   rotator cuff     rt   TENDON TRANSFER Left 10/28/2013   Procedure: LEFT FLEXOR DIGITORUM LONGUS TRANSFER, POSTERIOR TIBIAL TENOLYSIS;  Surgeon: Wylene Simmer, MD;  Location: [facility];  Service: Orthopedics;  Laterality: Left;   THYROIDECTOMY     Patient Active Problem  List   Diagnosis Date Noted   S/P lumbar fusion 08/30/2021   Spinal stenosis 08/29/2021   Calcific tendinitis of left shoulder 06/22/2012   Acromioclavicular arthrosis 06/22/2012   Postlaminectomy syndrome, lumbar region 01/28/2012   Disorders of sacrum 01/28/2012   Other chronic postoperative pain 01/28/2012   Pancreatitis 07/13/2011   Hypertension 07/13/2011   COPD (chronic obstructive pulmonary disease) ([place]) 07/13/2011    PCP: Josetta Huddle, MD  REFERRING PROVIDER: Melina Schools, MD   REFERRING DIAG: [phone] (ICD-10-CM) - Encounter for other specified surgical aftercare   Lateral interbody fusion L2-3, and L3-4.  Application of lateral plate L3-4  Posterior arthrodesis L2-4. Posterior spinal fusion instrumentation L2-3 with cortical pedicle screws                     Allograft for fusion DBX mix 10 cc  THERAPY DIAG:  Other low back pain - Plan: PT plan of care cert/re-cert  Muscle weakness (generalized) - Plan: PT plan of care cert/re-cert  Difficulty walking - Plan: PT plan of care cert/re-cert  ONSET DATE: 5/39/76 and 08/30/2021 Date of Surgery  SUBJECTIVE:  SUBJECTIVE STATEMENT:  Prior to surgery she had LE NT and severe pain that limited mobility. Pt states that since surgery, the pain is not that bad anymore. She just aches all over the hips and the back of the hip. She states that tranfers hurt all the down the knees. She does not have NT. She feels that it is tender to touch. She is a home caregiver. She does not need to do any heavy lifting. She has the most trouble with stairs. She did not realize she would not be able to bend anymore following the surgery.    PERTINENT HISTORY:  Bilat TKA, R RC repair, COPD, DM, HTN  PAIN:  Are you having pain? Yes: NPRS scale: 0/10 Pain  location: bilateral thigh and ankles Pain description: achey, never sharp  Aggravating factors: stairs, bending, lifting, transfers, movement  Relieving factors: medication/pain med, icy-hot    PRECAUTIONS: Back  WEIGHT BEARING RESTRICTIONS No  FALLS:  Has patient fallen in last 6 months? 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    OBJECTIVE:   DIAGNOSTIC FINDINGS:  IMPRESSION: Fluoroscopic guidance provided during surgical fusion of L2-3 and L3-4.  PATIENT SURVEYS:  FOTO 59 64 at DC 5 pts MCII  SCREENING FOR RED FLAGS: Bowel or bladder incontinence: No Spinal tumors: No Cauda equina syndrome: No Compression fracture: No Abdominal aneurysm: No  COGNITION:  Overall cognitive status: Within functional limits for tasks assessed     SENSATION: WFL  POSTURE:  Mild fwd flexion in standing, L SB in seated, decreased lumbar lordosis in standing  PALPATION: TTP of bilat L/S paraspinals with R>L, expected joint stiffness following lumbar fusion  LUMBAR ROM:   Active  A/PROM  12/20/2021  Flexion 50%  Extension 30%  Right lateral flexion 50%  Left lateral flexion 50%  Right rotation 50%  Left rotation 50%   (Blank rows = not tested)  LE MMT:  MMT Right 12/20/2021 Left 12/20/2021  Hip flexion 4/5 p! 4/5  Hip extension 4/5 4/5  Hip abduction 4+/5p! 4+/5  Hip adduction 4+/5 4+/5  Hip internal rotation    Hip external rotation    Knee flexion 4+/5 4+/5  Knee extension 4+/5 4+/5  Ankle dorsiflexion 4/5 4/5   (Blank rows = not tested)  LUMBAR SPECIAL TESTS:  Single leg stance test: Positive, Stork standing: Positive, and FABER test: Positive  FUNCTIONAL TESTS:  5 times  sit to stand: 23s  GAIT: Distance walked: 54f Assistive device utilized: None Level of assistance: Complete Independence Comments: decreased step length, compensated Trendelenburg on R    TODAY'S TREATMENT    Exercises - Neutral Lumbar Spine Curl Up  - 1 x daily - 7 x weekly - 2 sets - 10 reps - 2 hold - Supine Bridge  - 1 x daily - 7 x weekly - 2 sets - 10 reps - Supine Piriformis Stretch with Foot on Ground  - 1 x daily - 7 x weekly - 1 sets - 3 reps - 30 hold   PATIENT EDUCATION:  Education details: MOI, diagnosis, prognosis, anatomy, exercise progression, DOMS expectations, muscle firing,  envelope of function, HEP, POC Person educated: Patient Education method: Explanation, Demonstration, Tactile cues, Verbal cues, and Handouts  Education comprehension: verbalized understanding, returned demonstration, verbal cues required, and tactile cues required   HOME EXERCISE PROGRAM: Access Code: [email or web address] URL: [email or web address] Date: 12/20/2021 Prepared by: Daleen Bo  ASSESSMENT:  CLINICAL IMPRESSION: Patient is a 71 y.o. female who was seen today for physical therapy evaluation and treatment for s/p L3-4 arthrodesis. Pt has expected ROM, strength, and lumbopelvic motor control deficits following surgery. Pt is most limited in strength and flexibility. Pt has good limited mobility but has well managed pain except for muscle aching at rest. Pt's history of bilat TKA and subsequent knee weakness likely also contributing to gait and strength deficits. Pt would benefit from continued skilled therapy in order to reach goals and maximize functional lumbopelvic strength and ROM for full return to PLOF.    OBJECTIVE IMPAIRMENTS Abnormal gait, decreased activity tolerance, decreased balance, decreased endurance, decreased knowledge of condition, decreased mobility, difficulty walking, decreased ROM, decreased strength, hypomobility, increased fascial restrictions, increased  muscle spasms, impaired flexibility, impaired sensation, improper body mechanics, postural dysfunction, and pain.   ACTIVITY LIMITATIONS cleaning, community activity, driving, laundry, yard work, shopping, and exercise .   PERSONAL FACTORS Age, Fitness, Time since onset of injury/illness/exacerbation, and 1-2 comorbidities:    are also affecting patient's functional outcome.    REHAB POTENTIAL: Fair    CLINICAL DECISION MAKING: Stable/uncomplicated  EVALUATION COMPLEXITY: Low   GOALS:   SHORT TERM GOALS: Target date: 01/31/2022      Pt will become independent with HEP in order to demonstrate synthesis of PT education.   Goal status: INITIAL  2.  Pt will be able to demonstrate/report ability to sit/stand/sleep for extended periods of time without pain in order to demonstrate functional improvement and tolerance to static positioning.   Goal status: INITIAL  3.  Pt will score at least 5 pt increase on FOTO to demonstrate functional improvement in MCII and pt perceived function.    Goal status: INITIAL   LONG TERM GOALS: Target date: 03/14/2022   Pt  will become independent with final HEP in order to demonstrate synthesis of PT education.   Goal status: INITIAL  2.  Pt will be able to demonstrate reciprocal stair stepping with single UE in order to demonstrate functional improvement in hip/LE function for self-care and house hold duties.   Goal status: INITIAL  3.  Pt will be able to demonstrate/report ability to walk >25 mins without pain in order to demonstrate functional improvement and tolerance to exercise and community mobility.   Goal status: INITIAL  4.  Pt will score >/= 64 on FOTO to demonstrate improvement in perceived lumbopelvic function.   Goal status: INITIAL    PLAN: PT FREQUENCY: 1-2x/week  PT DURATION: 12 weeks  PLANNED INTERVENTIONS: Therapeutic exercises, Therapeutic activity, Neuromuscular re-education, Balance training, Gait training,  Patient/Family education, Joint manipulation, Joint mobilization, Stair training, Orthotic/Fit training, DME instructions, Aquatic Therapy, Dry Needling, Electrical stimulation, Spinal manipulation, Spinal mobilization, Cryotherapy, Moist heat, scar mobilization, Taping, Vasopneumatic device, Traction, Ultrasound, Ionotophoresis '4mg'$ /ml Dexamethasone, Manual therapy, and Re-evaluation.  PLAN FOR NEXT SESSION: intro to aquatic- bilat hip flexibility and strength, lumbopelvic strength   Daleen Bo, PT 12/20/2021, 4:52 PM   Referring diagnosis?  Z48.89 (ICD-10-CM) - Encounter for other specified surgical aftercare  Treatment diagnosis? (if different than referring diagnosis) m54.59, m62.81, r26,2 What was this (referring dx) caused by? '[x]'$  Surgery '[]'$  Fall '[]'$  Ongoing issue '[]'$  Arthritis '[]'$  Other: ____________  Laterality: '[]'$  Rt '[]'$  Lt '[x]'$  Both  Check all possible CPT codes:      '[]'$   84665 (Therapeutic Exercise)  '[]'$  92507 (SLP Treatment)  '[]'$  [id number] (Neuro Re-ed)   '[]'$  92526 (Swallowing Treatment)   '[]'$  97116 (Gait Training)   '[]'$  [id number] (Cognitive Training, 1st 15 minutes) '[]'$  97140 (Manual Therapy)   '[]'$  97130 (Cognitive Training, each add'l 15 minutes)  '[]'$  97530 (Therapeutic Activities)  '[]'$  Other, List CPT Code ____________    '[]'$  97535 (Self Care)       '[x]'$  All codes above (97110 - 97535)  '[x]'$  97012 (Mechanical Traction)  '[x]'$  97014 (E-stim Unattended)  '[x]'$  97032 (E-stim manual)  '[]'$  97033 (Ionto)  '[x]'$  97035 (Ultrasound)  '[]'$  97760 (Orthotic Fit) '[]'$  [id number] (Physical Performance Training) '[x]'$  [id number] (Aquatic Therapy) '[]'$  97034 (Contrast Bath) '[]'$  [id number] (Paraffin) '[]'$  97597 (Wound Care 1st 20 sq cm) '[]'$  97598 (Wound Care each add'l 20 sq cm) '[x]'$  97016 (Vasopneumatic Device) '[x]'$  [id number] Comptroller) '[]'$  [phone] (Prosthetic Training)

## 2021-12-24 DIAGNOSIS — M961 Postlaminectomy syndrome, not elsewhere classified: Secondary | ICD-10-CM | POA: Diagnosis not present

## 2021-12-25 DIAGNOSIS — R8290 Unspecified abnormal findings in urine: Secondary | ICD-10-CM | POA: Diagnosis not present

## 2022-01-07 ENCOUNTER — Ambulatory Visit (HOSPITAL_BASED_OUTPATIENT_CLINIC_OR_DEPARTMENT_OTHER): Payer: Medicare HMO | Attending: Orthopedic Surgery | Admitting: Physical Therapy

## 2022-01-07 ENCOUNTER — Encounter (HOSPITAL_BASED_OUTPATIENT_CLINIC_OR_DEPARTMENT_OTHER): Payer: Self-pay | Admitting: Physical Therapy

## 2022-01-07 DIAGNOSIS — M6281 Muscle weakness (generalized): Secondary | ICD-10-CM | POA: Insufficient documentation

## 2022-01-07 DIAGNOSIS — R262 Difficulty in walking, not elsewhere classified: Secondary | ICD-10-CM | POA: Insufficient documentation

## 2022-01-07 DIAGNOSIS — M5459 Other low back pain: Secondary | ICD-10-CM | POA: Insufficient documentation

## 2022-01-07 NOTE — Therapy (Signed)
OUTPATIENT PHYSICAL THERAPY TREATMENT NOTE   Patient Name: MEAGHEN VECCHIARELLI MRN: [id number] DOB:11/25/1950, 71 y.o., female Today's Date: 01/07/2022  PCP: Josetta Huddle, MD REFERRING PROVIDER: Melina Schools, MD   END OF SESSION:   PT End of Session - 01/07/22 1435     Visit Number 2    Number of Visits 19    Date for PT Re-Evaluation 03/20/22    Authorization Type [employer] MCR    PT Start Time 1420    PT Stop Time 1500    PT Time Calculation (min) 40 min    Activity Tolerance Patient tolerated treatment well;Patient limited by pain    Behavior During Therapy WFL for tasks assessed/performed             Past Medical History:  Diagnosis Date   Arthritis    Asthma    Diabetes mellitus without complication ([place])    GERD (gastroesophageal reflux disease)    Hyperlipidemia    Hypertension    Hypothyroidism    Pancreatitis    history   Past Surgical History:  Procedure Laterality Date   ABDOMINAL HYSTERECTOMY     ANTERIOR LAT LUMBAR FUSION N/A 08/29/2021   Procedure: EXTREME LATERAL INTERBODY FUSION LUMBAR TWO THROUGH FOUR;  Surgeon: Melina Schools, MD;  Location: [facility];  Service: Orthopedics;  Laterality: N/A;  3.5 hrs left tap block with exparel 3 C-Bed   BACK SURGERY  2011   lumbar fusion   CALCANEAL OSTEOTOMY Left 10/28/2013   Procedure: LEFT CALCANEAL OSTEOTOMY;  Surgeon: Wylene Simmer, MD;  Location: [facility];  Service: Orthopedics;  Laterality: Left;   CHOLECYSTECTOMY  2009   GASTROC RECESSION EXTREMITY Left 10/28/2013   Procedure: LEFT GASTROCNEMIUS RECESSION ;  Surgeon: Wylene Simmer, MD;  Location: [facility];  Service: Orthopedics;  Laterality: Left;   JOINT REPLACEMENT     bilat total knees   KNEE SURGERY     right   rotator cuff     rt   TENDON TRANSFER Left 10/28/2013   Procedure: LEFT FLEXOR DIGITORUM LONGUS TRANSFER, POSTERIOR TIBIAL TENOLYSIS;  Surgeon: Wylene Simmer, MD;  Location: [facility];  Service:  Orthopedics;  Laterality: Left;   THYROIDECTOMY     Patient Active Problem List   Diagnosis Date Noted   S/P lumbar fusion 08/30/2021   Spinal stenosis 08/29/2021   Calcific tendinitis of left shoulder 06/22/2012   Acromioclavicular arthrosis 06/22/2012   Postlaminectomy syndrome, lumbar region 01/28/2012   Disorders of sacrum 01/28/2012   Other chronic postoperative pain 01/28/2012   Pancreatitis 07/13/2011   Hypertension 07/13/2011   COPD (chronic obstructive pulmonary disease) ([place]) 07/13/2011    REFERRING DIAG: Z48.89 (ICD-10-CM) - Encounter for other specified surgical aftercare   THERAPY DIAG:  Other low back pain  Muscle weakness (generalized)  Difficulty walking  Rationale for Evaluation and Treatment Rehabilitation  PAIN:  Are you having pain? Yes: NPRS scale: 0/10 Pain location: bilateral thigh and ankles Pain description: achey, never sharp  Aggravating factors: stairs, bending, lifting, transfers, movement  Relieving factors: medication/pain med, icy-hot     PRECAUTIONS: back  SUBJECTIVE: "Took a pain pill this morning and feel alright right now."  and FABER test: Positive   FUNCTIONAL TESTS:  5 times sit to stand: 23s   GAIT: Distance walked: 5f Assistive device utilized: None Level of assistance: Complete Independence Comments: decreased step length, compensated Trendelenburg on R       TODAY'S TREATMENT    Pt seen for aquatic  therapy today.  Treatment took place in water 3.25-4.8 ft in depth at the [facility] pool. Temp of water was 91.  Pt entered/exited the pool via stairs (step through pattern) independently with bilat rail.  Intro to setting Walking forward, back and side stepping ue supported on yellow noodle x 4 widths each  Seated lumbar stretch 4 x 20s  Standing lumbar stretch into flex 3 x 20s Lumbar rotational stretch R/L 2 x 10, pausing at end range Holding to wall: high knee marching; hip extension; add/abd x10.  Cues for posture and proper execution  Core strengthening; cues for abdominal bracing and glut contractions for core engagement -blue noodle pull down 2x10 -kick board push down 2x10 -kick board push pull 2x10   Pt requires buoyancy for support and to offload joints with strengthening exercises. Viscosity of the water is needed for resistance of strengthening; water current perturbations provides challenge to standing balance unsupported, requiring increased core activation.   Exercises - Neutral Lumbar Spine Curl Up  - 1 x daily - 7 x weekly - 2 sets - 10 reps - 2 hold - Supine Bridge  - 1 x daily - 7 x weekly - 2 sets - 10 reps - Supine Piriformis Stretch with Foot on Ground  - 1 x daily - 7 x weekly - 1 sets - 3 reps - 30 hold     PATIENT EDUCATION:  Education details: MOI, diagnosis, prognosis, anatomy, exercise progression, DOMS expectations, muscle firing,  envelope of function, HEP, POC Person educated: Patient Education method: Explanation, Demonstration, Tactile cues, Verbal cues, and Handouts Education comprehension: verbalized understanding, returned demonstration, verbal cues required, and tactile cues required     HOME EXERCISE PROGRAM: Access Code: [email or web address] URL: [email or web address] Date: 12/20/2021 Prepared by: Daleen Bo   ASSESSMENT:   CLINICAL IMPRESSION: Pt moderately comfortable in setting.  Initially wants to stay near wall but as session progresses she becomes more comfortable.  Therapist instruction from out of pool. Pt guarded throughout her shoulders/cervical spine throughout requiring constant VC for relaxation. She reports feeling engagement of core with exercises  and stretching with slight discomfort/tightness in LB, hips and upper thighs. Pt is a good candidate for aquatic therapy using the properties of water to facilitate and enhance progression towards goals   Patient is a 71 y.o. female who was seen today for physical therapy  evaluation and treatment for s/p L3-4 arthrodesis. Pt has expected ROM, strength, and lumbopelvic motor control deficits following surgery. Pt is most limited in strength and flexibility. Pt has good limited mobility but has well managed pain except for muscle aching at rest. Pt's history of bilat TKA and subsequent knee weakness likely also contributing to gait and strength deficits. Pt would benefit from continued skilled therapy in order to reach goals and maximize functional lumbopelvic strength and ROM for full return to PLOF.      OBJECTIVE IMPAIRMENTS Abnormal gait, decreased activity tolerance, decreased balance, decreased endurance, decreased knowledge of condition, decreased mobility, difficulty walking, decreased ROM, decreased strength, hypomobility, increased fascial restrictions, increased muscle spasms, impaired flexibility, impaired sensation, improper body mechanics, postural dysfunction, and pain.    ACTIVITY LIMITATIONS cleaning, community activity, driving, laundry, yard work, shopping, and exercise .    PERSONAL FACTORS Age, Fitness, Time since onset of injury/illness/exacerbation, and 1-2 comorbidities:    are also affecting patient's functional outcome.      REHAB POTENTIAL: Fair     CLINICAL DECISION MAKING: Stable/uncomplicated   EVALUATION COMPLEXITY: Low  GOALS:     SHORT TERM GOALS: Target date: 01/31/2022       Pt will become independent with HEP in order to demonstrate synthesis of PT education.     Goal status: INITIAL   2.  Pt will be able to demonstrate/report ability to sit/stand/sleep for extended periods of time without pain in order to demonstrate functional improvement and tolerance to static positioning.    Goal status: INITIAL   3.  Pt will score at least 5 pt increase on FOTO to demonstrate functional improvement in MCII and pt perceived function.     Goal status: INITIAL     LONG TERM GOALS: Target date: 03/14/2022   Pt  will become  independent with final HEP in order to demonstrate synthesis of PT education.     Goal status: INITIAL   2.  Pt will be able to demonstrate reciprocal stair stepping with single UE in order to demonstrate functional improvement in hip/LE function for self-care and house hold duties.    Goal status: INITIAL   3.  Pt will be able to demonstrate/report ability to walk >25 mins without pain in order to demonstrate functional improvement and tolerance to exercise and community mobility.    Goal status: INITIAL   4.  Pt will score >/= 64 on FOTO to demonstrate improvement in perceived lumbopelvic function.    Goal status: INITIAL       PLAN: PT FREQUENCY: 1-2x/week   PT DURATION: 12 weeks   PLANNED INTERVENTIONS: Therapeutic exercises, Therapeutic activity, Neuromuscular re-education, Balance training, Gait training, Patient/Family education, Joint manipulation, Joint mobilization, Stair training, Orthotic/Fit training, DME instructions, Aquatic Therapy, Dry Needling, Electrical stimulation, Spinal manipulation, Spinal mobilization, Cryotherapy, Moist heat, scar mobilization, Taping, Vasopneumatic device, Traction, Ultrasound, Ionotophoresis '4mg'$ /ml Dexamethasone, Manual therapy, and Re-evaluation.   PLAN FOR NEXT SESSION: intro to aquatic- bilat hip flexibility and strength, lumbopelvic strength      Stanton Kidney ([place]ie) Yonathan Perrow MPT 01/07/2022, 2:44 PM

## 2022-01-09 ENCOUNTER — Ambulatory Visit (HOSPITAL_BASED_OUTPATIENT_CLINIC_OR_DEPARTMENT_OTHER): Payer: Medicare HMO | Admitting: Physical Therapy

## 2022-01-14 ENCOUNTER — Ambulatory Visit (HOSPITAL_BASED_OUTPATIENT_CLINIC_OR_DEPARTMENT_OTHER): Payer: Medicare HMO | Admitting: Physical Therapy

## 2022-01-15 ENCOUNTER — Ambulatory Visit (HOSPITAL_BASED_OUTPATIENT_CLINIC_OR_DEPARTMENT_OTHER): Payer: Medicare HMO | Admitting: Physical Therapy

## 2022-01-15 ENCOUNTER — Encounter (HOSPITAL_BASED_OUTPATIENT_CLINIC_OR_DEPARTMENT_OTHER): Payer: Self-pay | Admitting: Physical Therapy

## 2022-01-15 DIAGNOSIS — R262 Difficulty in walking, not elsewhere classified: Secondary | ICD-10-CM

## 2022-01-15 DIAGNOSIS — M6281 Muscle weakness (generalized): Secondary | ICD-10-CM

## 2022-01-15 DIAGNOSIS — M5459 Other low back pain: Secondary | ICD-10-CM

## 2022-01-15 NOTE — Therapy (Signed)
OUTPATIENT PHYSICAL THERAPY TREATMENT NOTE   Patient Name: Tina Neal MRN: [id number] DOB:[birth year], 71 y.o., female Today's Date: 01/15/2022  PCP: Josetta Huddle, MD REFERRING PROVIDER: Melina Schools, MD   END OF SESSION:   PT End of Session - 01/15/22 0805     Visit Number 3    Number of Visits 19    Date for PT Re-Evaluation 03/20/22    Authorization Type [employer] MCR    PT Start Time 0803    PT Stop Time 0843    PT Time Calculation (min) 40 min    Activity Tolerance Patient tolerated treatment well    Behavior During Therapy WFL for tasks assessed/performed             Past Medical History:  Diagnosis Date   Arthritis    Asthma    Diabetes mellitus without complication (HCC)    GERD (gastroesophageal reflux disease)    Hyperlipidemia    Hypertension    Hypothyroidism    Pancreatitis    history   Past Surgical History:  Procedure Laterality Date   ABDOMINAL HYSTERECTOMY     ANTERIOR LAT LUMBAR FUSION N/A 08/29/2021   Procedure: EXTREME LATERAL INTERBODY FUSION LUMBAR TWO THROUGH FOUR;  Surgeon: Melina Schools, MD;  Location: [place];  Service: Orthopedics;  Laterality: N/A;  3.5 hrs left tap block with exparel 3 C-Bed   BACK SURGERY  2011   lumbar fusion   CALCANEAL OSTEOTOMY Left 10/28/2013   Procedure: LEFT CALCANEAL OSTEOTOMY;  Surgeon: Wylene Simmer, MD;  Location: [place];  Service: Orthopedics;  Laterality: Left;   CHOLECYSTECTOMY  2009   GASTROC RECESSION EXTREMITY Left 10/28/2013   Procedure: LEFT GASTROCNEMIUS RECESSION ;  Surgeon: Wylene Simmer, MD;  Location: [place];  Service: Orthopedics;  Laterality: Left;   JOINT REPLACEMENT     bilat total knees   KNEE SURGERY     right   rotator cuff     rt   TENDON TRANSFER Left 10/28/2013   Procedure: LEFT FLEXOR DIGITORUM LONGUS TRANSFER, POSTERIOR TIBIAL TENOLYSIS;  Surgeon: Wylene Simmer, MD;  Location: [place];  Service: Orthopedics;  Laterality:  Left;   THYROIDECTOMY     Patient Active Problem List   Diagnosis Date Noted   S/P lumbar fusion 08/30/2021   Spinal stenosis 08/29/2021   Calcific tendinitis of left shoulder 06/22/2012   Acromioclavicular arthrosis 06/22/2012   Postlaminectomy syndrome, lumbar region 01/28/2012   Disorders of sacrum 01/28/2012   Other chronic postoperative pain 01/28/2012   Pancreatitis 07/13/2011   Hypertension 07/13/2011   COPD (chronic obstructive pulmonary disease) ([place]) 07/13/2011    REFERRING DIAG: Z48.89 (ICD-10-CM) - Encounter for other specified surgical aftercare   THERAPY DIAG:  Other low back pain  Muscle weakness (generalized)  Difficulty walking  Rationale for Evaluation and Treatment Rehabilitation  PAIN:  Are you having pain? Yes: NPRS scale: 5/10 Pain location: bilat low back Pain description: achey; tight Aggravating factors: stairs, bending, lifting, transfers, movement  Relieving factors: medication/pain med, icy-hot     PRECAUTIONS: back  SUBJECTIVE: Pt reports she has done the bridge exercise, but that is it.  She stated she felt, "pretty good" after last session.  She reports that the stiffness in her back is her main complaint.   PERTINENT HISTORY:  Bilat TKA, R RC repair, COPD, DM, HTN   PAIN:  Are you having pain? Yes: NPRS scale: 0/10 Pain location: bilateral thigh and ankles Pain description: achey, never sharp  Aggravating factors: stairs, bending, lifting, transfers, movement  Relieving factors: medication/pain med, icy-hot      PRECAUTIONS: Back   WEIGHT BEARING RESTRICTIONS No   FALLS:  Has patient fallen in last 6 months? 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      OBJECTIVE: * Findings taken at eval unless otherwise noted.     DIAGNOSTIC FINDINGS:  IMPRESSION: Fluoroscopic guidance provided during surgical fusion of L2-3 and L3-4.   PATIENT SURVEYS:  FOTO 59 64 at DC 5 pts MCII   SCREENING FOR RED FLAGS: Bowel or bladder incontinence: No Spinal tumors: No Cauda equina syndrome: No Compression fracture: No Abdominal aneurysm: No   COGNITION:           Overall cognitive status: Within functional limits for tasks assessed                          SENSATION: WFL   POSTURE:  Mild fwd flexion in standing, L SB in seated, decreased lumbar lordosis in standing   PALPATION: TTP of bilat L/S paraspinals with R>L, expected joint stiffness following lumbar fusion   LUMBAR ROM:    Active  A/PROM  12/20/2021  Flexion 50%  Extension 30%  Right lateral flexion 50%  Left lateral flexion 50%  Right rotation 50%  Left rotation 50%   (Blank rows = not tested)   LE MMT:   MMT Right 12/20/2021 Left 12/20/2021  Hip flexion 4/5 p! 4/5  Hip extension 4/5 4/5  Hip abduction 4+/5p! 4+/5  Hip adduction 4+/5 4+/5  Hip internal rotation      Hip external rotation      Knee flexion 4+/5 4+/5  Knee extension 4+/5 4+/5  Ankle dorsiflexion 4/5 4/5   (Blank rows = not tested)   LUMBAR SPECIAL TESTS:  Single leg stance test: Positive, Stork standing: Positive, and FABER test: Positive   FUNCTIONAL TESTS:  5 times sit to stand: 23s   GAIT: Distance walked: 80f Assistive device utilized: None Level of assistance: Complete Independence Comments: decreased step length, compensated Trendelenburg on R       TODAY'S TREATMENT    Pt seen for aquatic therapy today.  Treatment took place in water 3.25-4 ft [facility] Temp of water was 91.  Pt entered/exited the pool via stairs (step through pattern) independently with bilat rail.   -Walking forward, back, high knee marching, and side  stepping with UE supported on black barbell x 2-3 laps each - Holding wall: slow squats x 10; hip/knee flexion crossing midline and hip abdct/ knee flexion; hip flexion/  - blue noodle pull down with ab set (no glute set) -holding rails at stairs: L stretch (lumbar flexion stretch with LE straight) x 2; trial of fig 4 (unable to perform on RLE)  On deck, sitting on bench:  quad stretch (single foot under seat) x 10 sec each leg  Pt requires buoyancy for support and to offload joints with strengthening exercises. Viscosity of the water is needed for resistance of strengthening; water current perturbations provides challenge to standing balance unsupported, requiring increased core activation.   PATIENT EDUCATION:  Education details:  exercise progression, DOMS expectations, HEP Person educated: Patient Education method: Explanation, Demonstration, Tactile cues, Verbal cues, and Handouts Education comprehension: verbalized understanding, returned demonstration, verbal cues required, and tactile cues required     HOME EXERCISE PROGRAM: Access Code: [email or web address] URL: [email or web address] Date: 12/20/2021 Prepared by: Daleen Bo Verbally added supine fig 4 and seated quad stretch 2x/day, 10-30 sec, 2 reps (JCL)   ASSESSMENT:   CLINICAL IMPRESSION: Pt moderately comfortable in setting; able to take direction from therapist on deck. Hesitant to move into deeper water (at sternum), but able to move deeper than last session.  Pt became less guarded with cues at beginning of session to relax shoulders/cervical spine.  Minor cues for upright posture.  Pt reported no pain while in the water.  Pt continues to be a good candidate for aquatic therapy using the properties of water to facilitate and enhance progression towards goals.  She would benefit from LE (hip/knee/ankle) flexibility exercises as she keeps these stiff with gait.       OBJECTIVE IMPAIRMENTS Abnormal gait, decreased  activity tolerance, decreased balance, decreased endurance, decreased knowledge of condition, decreased mobility, difficulty walking, decreased ROM, decreased strength, hypomobility, increased fascial restrictions, increased muscle spasms, impaired flexibility, impaired sensation, improper body mechanics, postural dysfunction, and pain.    ACTIVITY LIMITATIONS cleaning, community activity, driving, laundry, yard work, shopping, and exercise .    PERSONAL FACTORS Age, Fitness, Time since onset of injury/illness/exacerbation, and 1-2 comorbidities:    are also affecting patient's functional outcome.      REHAB POTENTIAL: Fair     CLINICAL DECISION MAKING: Stable/uncomplicated   EVALUATION COMPLEXITY: Low     GOALS:     SHORT TERM GOALS: Target date: 01/31/2022       Pt will become independent with HEP in order to demonstrate synthesis of PT education.     Goal status: INITIAL   2.  Pt will be able to demonstrate/report ability to sit/stand/sleep for extended periods of time without pain in order to demonstrate functional improvement and tolerance to static positioning.    Goal status: INITIAL   3.  Pt will score at least 5 pt increase on FOTO to demonstrate functional improvement in MCII and pt perceived function.     Goal status: INITIAL     LONG TERM GOALS: Target date: 03/14/2022   Pt  will become independent with final HEP in order to demonstrate synthesis of PT education.     Goal status: INITIAL   2.  Pt will be able to demonstrate reciprocal stair stepping with single UE in order to demonstrate functional improvement in hip/LE function for self-care and house hold duties.    Goal status: INITIAL   3.  Pt will be able to demonstrate/report ability to walk >25 mins without pain in order to demonstrate functional improvement and tolerance to exercise and community mobility.    Goal status: INITIAL   4.  Pt will score >/= 64 on FOTO to demonstrate improvement in  perceived lumbopelvic function.    Goal status: INITIAL       PLAN: PT FREQUENCY: 1-2x/week   PT DURATION: 12 weeks   PLANNED INTERVENTIONS: Therapeutic exercises, Therapeutic activity, Neuromuscular re-education, Balance training, Gait training, Patient/Family education, Joint manipulation, Joint mobilization, Stair training, Orthotic/Fit training, DME instructions, Aquatic Therapy, Dry Needling, Electrical stimulation, Spinal manipulation, Spinal mobilization, Cryotherapy, Moist heat, scar mobilization, Taping, Vasopneumatic device, Traction, Ultrasound, Ionotophoresis '4mg'$ /ml Dexamethasone, Manual therapy, and Re-evaluation.   PLAN FOR NEXT SESSION: continue  aquatic- bilat hip flexibility and strength, lumbopelvic strength    Kerin Perna, PTA 01/15/22 9:56 AM

## 2022-01-16 ENCOUNTER — Ambulatory Visit (HOSPITAL_BASED_OUTPATIENT_CLINIC_OR_DEPARTMENT_OTHER): Payer: Medicare HMO | Admitting: Physical Therapy

## 2022-01-17 ENCOUNTER — Encounter (HOSPITAL_BASED_OUTPATIENT_CLINIC_OR_DEPARTMENT_OTHER): Payer: Self-pay | Admitting: Physical Therapy

## 2022-01-17 ENCOUNTER — Ambulatory Visit (HOSPITAL_BASED_OUTPATIENT_CLINIC_OR_DEPARTMENT_OTHER): Payer: Medicare HMO | Admitting: Physical Therapy

## 2022-01-17 DIAGNOSIS — M5459 Other low back pain: Secondary | ICD-10-CM

## 2022-01-17 DIAGNOSIS — R262 Difficulty in walking, not elsewhere classified: Secondary | ICD-10-CM

## 2022-01-17 DIAGNOSIS — M6281 Muscle weakness (generalized): Secondary | ICD-10-CM | POA: Diagnosis not present

## 2022-01-17 NOTE — Therapy (Signed)
OUTPATIENT PHYSICAL THERAPY TREATMENT NOTE   Patient Name: Tina Neal MRN: [id number] DOB:April 21, 1951, 71 y.o., female Today's Date: 01/17/2022  PCP: Josetta Huddle, MD REFERRING PROVIDER: Melina Schools, MD   END OF SESSION:   PT End of Session - 01/17/22 0905     Visit Number 4    Number of Visits 19    Date for PT Re-Evaluation 03/20/22    Authorization Type [employer] MCR    PT Start Time 0902   Pt arrived late; traffic   PT Stop Time 0930    PT Time Calculation (min) 28 min    Activity Tolerance Patient tolerated treatment well    Behavior During Therapy WFL for tasks assessed/performed             Past Medical History:  Diagnosis Date   Arthritis    Asthma    Diabetes mellitus without complication ([place])    GERD (gastroesophageal reflux disease)    Hyperlipidemia    Hypertension    Hypothyroidism    Pancreatitis    history   Past Surgical History:  Procedure Laterality Date   ABDOMINAL HYSTERECTOMY     ANTERIOR LAT LUMBAR FUSION N/A 08/29/2021   Procedure: EXTREME LATERAL INTERBODY FUSION LUMBAR TWO THROUGH FOUR;  Surgeon: Melina Schools, MD;  Location: [place];  Service: Orthopedics;  Laterality: N/A;  3.5 hrs left tap block with exparel 3 C-Bed   BACK SURGERY  2011   lumbar fusion   CALCANEAL OSTEOTOMY Left 10/28/2013   Procedure: LEFT CALCANEAL OSTEOTOMY;  Surgeon: Wylene Simmer, MD;  Location: [place];  Service: Orthopedics;  Laterality: Left;   CHOLECYSTECTOMY  2009   GASTROC RECESSION EXTREMITY Left 10/28/2013   Procedure: LEFT GASTROCNEMIUS RECESSION ;  Surgeon: Wylene Simmer, MD;  Location: [place];  Service: Orthopedics;  Laterality: Left;   JOINT REPLACEMENT     bilat total knees   KNEE SURGERY     right   rotator cuff     rt   TENDON TRANSFER Left 10/28/2013   Procedure: LEFT FLEXOR DIGITORUM LONGUS TRANSFER, POSTERIOR TIBIAL TENOLYSIS;  Surgeon: Wylene Simmer, MD;  Location: [place];  Service:  Orthopedics;  Laterality: Left;   THYROIDECTOMY     Patient Active Problem List   Diagnosis Date Noted   S/P lumbar fusion 08/30/2021   Spinal stenosis 08/29/2021   Calcific tendinitis of left shoulder 06/22/2012   Acromioclavicular arthrosis 06/22/2012   Postlaminectomy syndrome, lumbar region 01/28/2012   Disorders of sacrum 01/28/2012   Other chronic postoperative pain 01/28/2012   Pancreatitis 07/13/2011   Hypertension 07/13/2011   COPD (chronic obstructive pulmonary disease) ([place]) 07/13/2011    REFERRING DIAG: Z48.89 (ICD-10-CM) - Encounter for other specified surgical aftercare   THERAPY DIAG:  Other low back pain  Muscle weakness (generalized)  Difficulty walking  Rationale for Evaluation and Treatment Rehabilitation     PRECAUTIONS: back  SUBJECTIVE: Pt reports no new changes since last visit.    PERTINENT HISTORY:  Bilat TKA, R RC repair, COPD, DM, HTN   PAIN:  Are you having pain? No  NPRS scale: 0/10 Pain location: bilateral thigh and ankles Pain description: achey, never sharp  Aggravating factors: stairs, bending, lifting, transfers, movement  Relieving factors: medication/pain med, icy-hot      PRECAUTIONS: Back   WEIGHT BEARING RESTRICTIONS No   FALLS:  Has patient fallen in last 6 months? 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      OBJECTIVE: * Findings taken at eval unless otherwise noted.     DIAGNOSTIC FINDINGS:  IMPRESSION: Fluoroscopic guidance provided during surgical fusion of L2-3 and L3-4.   PATIENT SURVEYS:  FOTO 59 64 at DC 5 pts MCII   SCREENING FOR RED FLAGS: Bowel or bladder incontinence: No Spinal tumors: No Cauda  equina syndrome: No Compression fracture: No Abdominal aneurysm: No   COGNITION:           Overall cognitive status: Within functional limits for tasks assessed                          SENSATION: WFL   POSTURE:  Mild fwd flexion in standing, L SB in seated, decreased lumbar lordosis in standing   PALPATION: TTP of bilat L/S paraspinals with R>L, expected joint stiffness following lumbar fusion   LUMBAR ROM:    Active  A/PROM  12/20/2021  Flexion 50%  Extension 30%  Right lateral flexion 50%  Left lateral flexion 50%  Right rotation 50%  Left rotation 50%   (Blank rows = not tested)   LE MMT:   MMT Right 12/20/2021 Left 12/20/2021  Hip flexion 4/5 p! 4/5  Hip extension 4/5 4/5  Hip abduction 4+/5p! 4+/5  Hip adduction 4+/5 4+/5  Hip internal rotation      Hip external rotation      Knee flexion 4+/5 4+/5  Knee extension 4+/5 4+/5  Ankle dorsiflexion 4/5 4/5   (Blank rows = not tested)   LUMBAR SPECIAL TESTS:  Single leg stance test: Positive, Stork standing: Positive, and FABER test: Positive   FUNCTIONAL TESTS:  5 times sit to stand: 23s   GAIT: Distance walked: 50f Assistive device utilized: None Level of assistance: Complete Independence Comments: decreased step length, compensated Trendelenburg on R       TODAY'S TREATMENT    Pt seen for aquatic therapy today.  Treatment took place in water 3.25-4 ft [facility] Temp of water was 91.  Pt entered/exited the pool via stairs (step through pattern) independently with bilat rail.   -Walking forward, back, high knee marching, and side stepping with UE supported on yellow x 3 laps each - Holding wall: slow squats x 10 x 2 ; hip/knee flexion crossing midline and hip abdct/ knee flexion - Leg swings- hip flexion/ hip ext x 10 each - Hip abdct/ add x 10 each - kickboard push/ pull at 10, 12, 2 o'clock in staggered stance - kickboard push down x 10 -holding rails at stairs:  forward lunge with foot on 2nd step (for knee ROM) followed by hamstring stretch x 5 reps each LE;  L stretch (lumbar flexion stretch with LE straight);  side stretch with arm reaching towards ceiling (very limited and painful Lt shoulder)  Pt requires buoyancy for support and to offload joints with strengthening exercises. Viscosity of the water is needed for resistance of strengthening; water current perturbations provides challenge to standing balance unsupported, requiring increased core activation.   PATIENT EDUCATION:  Education details:  exercise progression, DOMS expectations, HEP Person educated: Patient Education method: Explanation, Demonstration, Tactile cues, Verbal cues, and Handouts Education comprehension: verbalized  understanding, returned demonstration, verbal cues required, and tactile cues required     HOME EXERCISE PROGRAM: Access Code: [email or web address] URL: [email or web address] Date: 12/20/2021 Prepared by: Daleen Bo Verbally added supine fig 4 and seated quad stretch 2x/day, 10-30 sec, 2 reps (JCL)   ASSESSMENT:   CLINICAL IMPRESSION: Improved confidence in water; continues to require UE support on wall or floatation device. She has limited ROM with L shoulder.  Pt reported no pain while in the water.  Pt continues to be a good candidate for aquatic therapy using the properties of water to facilitate and enhance progression towards goals.  She would benefit from LE (hip/knee/ankle) flexibility exercises as she keeps these stiff with gait.       OBJECTIVE IMPAIRMENTS Abnormal gait, decreased activity tolerance, decreased balance, decreased endurance, decreased knowledge of condition, decreased mobility, difficulty walking, decreased ROM, decreased strength, hypomobility, increased fascial restrictions, increased muscle spasms, impaired flexibility, impaired sensation, improper body mechanics, postural dysfunction, and pain.    ACTIVITY LIMITATIONS cleaning,  community activity, driving, laundry, yard work, shopping, and exercise .    PERSONAL FACTORS Age, Fitness, Time since onset of injury/illness/exacerbation, and 1-2 comorbidities:    are also affecting patient's functional outcome.      REHAB POTENTIAL: Fair     CLINICAL DECISION MAKING: Stable/uncomplicated   EVALUATION COMPLEXITY: Low     GOALS:     SHORT TERM GOALS: Target date: 01/31/2022       Pt will become independent with HEP in order to demonstrate synthesis of PT education.     Goal status: INITIAL   2.  Pt will be able to demonstrate/report ability to sit/stand/sleep for extended periods of time without pain in order to demonstrate functional improvement and tolerance to static positioning.    Goal status: INITIAL   3.  Pt will score at least 5 pt increase on FOTO to demonstrate functional improvement in MCII and pt perceived function.     Goal status: INITIAL     LONG TERM GOALS: Target date: 03/14/2022   Pt  will become independent with final HEP in order to demonstrate synthesis of PT education.     Goal status: INITIAL   2.  Pt will be able to demonstrate reciprocal stair stepping with single UE in order to demonstrate functional improvement in hip/LE function for self-care and house hold duties.    Goal status: INITIAL   3.  Pt will be able to demonstrate/report ability to walk >25 mins without pain in order to demonstrate functional improvement and tolerance to exercise and community mobility.    Goal status: INITIAL   4.  Pt will score >/= 64 on FOTO to demonstrate improvement in perceived lumbopelvic function.    Goal status: INITIAL       PLAN: PT FREQUENCY: 1-2x/week   PT DURATION: 12 weeks   PLANNED INTERVENTIONS: Therapeutic exercises, Therapeutic activity, Neuromuscular re-education, Balance training, Gait training, Patient/Family education, Joint manipulation, Joint mobilization, Stair training, Orthotic/Fit training, DME instructions,  Aquatic Therapy, Dry Needling, Electrical stimulation, Spinal manipulation, Spinal mobilization, Cryotherapy, Moist heat, scar mobilization, Taping, Vasopneumatic device, Traction, Ultrasound, Ionotophoresis '4mg'$ /ml Dexamethasone, Manual therapy, and Re-evaluation.   PLAN FOR NEXT SESSION: continue aquatic- bilat hip flexibility and strength, lumbopelvic strength   Kerin Perna, PTA 01/17/22 9:36 AM

## 2022-01-21 ENCOUNTER — Ambulatory Visit (HOSPITAL_BASED_OUTPATIENT_CLINIC_OR_DEPARTMENT_OTHER): Payer: Medicare HMO | Admitting: Physical Therapy

## 2022-01-21 DIAGNOSIS — J449 Chronic obstructive pulmonary disease, unspecified: Secondary | ICD-10-CM | POA: Diagnosis not present

## 2022-01-21 DIAGNOSIS — K219 Gastro-esophageal reflux disease without esophagitis: Secondary | ICD-10-CM | POA: Diagnosis not present

## 2022-01-21 DIAGNOSIS — E782 Mixed hyperlipidemia: Secondary | ICD-10-CM | POA: Diagnosis not present

## 2022-01-21 DIAGNOSIS — I10 Essential (primary) hypertension: Secondary | ICD-10-CM | POA: Diagnosis not present

## 2022-01-21 DIAGNOSIS — E039 Hypothyroidism, unspecified: Secondary | ICD-10-CM | POA: Diagnosis not present

## 2022-01-21 DIAGNOSIS — E1143 Type 2 diabetes mellitus with diabetic autonomic (poly)neuropathy: Secondary | ICD-10-CM | POA: Diagnosis not present

## 2022-01-21 DIAGNOSIS — J4520 Mild intermittent asthma, uncomplicated: Secondary | ICD-10-CM | POA: Diagnosis not present

## 2022-01-22 ENCOUNTER — Ambulatory Visit (HOSPITAL_BASED_OUTPATIENT_CLINIC_OR_DEPARTMENT_OTHER): Payer: Medicare HMO | Admitting: Physical Therapy

## 2022-01-22 ENCOUNTER — Encounter (HOSPITAL_BASED_OUTPATIENT_CLINIC_OR_DEPARTMENT_OTHER): Payer: Self-pay | Admitting: Physical Therapy

## 2022-01-22 DIAGNOSIS — M6281 Muscle weakness (generalized): Secondary | ICD-10-CM

## 2022-01-22 DIAGNOSIS — R262 Difficulty in walking, not elsewhere classified: Secondary | ICD-10-CM | POA: Diagnosis not present

## 2022-01-22 DIAGNOSIS — M5459 Other low back pain: Secondary | ICD-10-CM

## 2022-01-22 NOTE — Therapy (Signed)
OUTPATIENT PHYSICAL THERAPY TREATMENT NOTE   Patient Name: Tina Neal MRN: [id number] DOB:Dec 03, 1950, 71 y.o., female Today's Date: 01/22/2022  PCP: Josetta Huddle, MD REFERRING PROVIDER: Melina Schools, MD   END OF SESSION:   PT End of Session - 01/22/22 1648     Visit Number 5    Number of Visits 19    Date for PT Re-Evaluation 03/20/22    Authorization Type [employer] MCR    PT Start Time 6734    PT Stop Time 1730    PT Time Calculation (min) 45 min    Activity Tolerance Patient tolerated treatment well    Behavior During Therapy WFL for tasks assessed/performed              Past Medical History:  Diagnosis Date   Arthritis    Asthma    Diabetes mellitus without complication ([place])    GERD (gastroesophageal reflux disease)    Hyperlipidemia    Hypertension    Hypothyroidism    Pancreatitis    history   Past Surgical History:  Procedure Laterality Date   ABDOMINAL HYSTERECTOMY     ANTERIOR LAT LUMBAR FUSION N/A 08/29/2021   Procedure: EXTREME LATERAL INTERBODY FUSION LUMBAR TWO THROUGH FOUR;  Surgeon: Melina Schools, MD;  Location: [facility];  Service: Orthopedics;  Laterality: N/A;  3.5 hrs left tap block with exparel 3 C-Bed   BACK SURGERY  2011   lumbar fusion   CALCANEAL OSTEOTOMY Left 10/28/2013   Procedure: LEFT CALCANEAL OSTEOTOMY;  Surgeon: Wylene Simmer, MD;  Location: [facility];  Service: Orthopedics;  Laterality: Left;   CHOLECYSTECTOMY  2009   GASTROC RECESSION EXTREMITY Left 10/28/2013   Procedure: LEFT GASTROCNEMIUS RECESSION ;  Surgeon: Wylene Simmer, MD;  Location: [facility];  Service: Orthopedics;  Laterality: Left;   JOINT REPLACEMENT     bilat total knees   KNEE SURGERY     right   rotator cuff     rt   TENDON TRANSFER Left 10/28/2013   Procedure: LEFT FLEXOR DIGITORUM LONGUS TRANSFER, POSTERIOR TIBIAL TENOLYSIS;  Surgeon: Wylene Simmer, MD;  Location: [facility];  Service: Orthopedics;  Laterality:  Left;   THYROIDECTOMY     Patient Active Problem List   Diagnosis Date Noted   S/P lumbar fusion 08/30/2021   Spinal stenosis 08/29/2021   Calcific tendinitis of left shoulder 06/22/2012   Acromioclavicular arthrosis 06/22/2012   Postlaminectomy syndrome, lumbar region 01/28/2012   Disorders of sacrum 01/28/2012   Other chronic postoperative pain 01/28/2012   Pancreatitis 07/13/2011   Hypertension 07/13/2011   COPD (chronic obstructive pulmonary disease) ([place]) 07/13/2011    REFERRING DIAG: Z48.89 (ICD-10-CM) - Encounter for other specified surgical aftercare   THERAPY DIAG:  Other low back pain  Muscle weakness (generalized)  Difficulty walking  Rationale for Evaluation and Treatment Rehabilitation     PRECAUTIONS: back  SUBJECTIVE: "Doing ok< water therapy is helping my back as well as my knees"  PERTINENT HISTORY:  Bilat TKA, R RC repair, COPD, DM, HTN   PAIN:  Are you having pain? No  NPRS scale: 6/10 Pain location: bilateral thigh and ankles Pain description: achey, never sharp  Aggravating factors: stairs, bending, lifting, transfers, movement  Relieving factors: medication/pain med, icy-hot      PRECAUTIONS: Back   WEIGHT BEARING RESTRICTIONS No   FALLS:  Has patient fallen in last 6 months? 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      OBJECTIVE: * Findings taken at eval unless otherwise noted.     DIAGNOSTIC FINDINGS:  IMPRESSION: Fluoroscopic guidance provided during surgical fusion of L2-3 and L3-4.   PATIENT SURVEYS:  FOTO 59 64 at DC 5 pts MCII   SCREENING FOR RED FLAGS: Bowel or bladder incontinence: No Spinal tumors: No Cauda equina  syndrome: No Compression fracture: No Abdominal aneurysm: No   COGNITION:           Overall cognitive status: Within functional limits for tasks assessed                          SENSATION: WFL   POSTURE:  Mild fwd flexion in standing, L SB in seated, decreased lumbar lordosis in standing   PALPATION: TTP of bilat L/S paraspinals with R>L, expected joint stiffness following lumbar fusion   LUMBAR ROM:    Active  A/PROM  12/20/2021  Flexion 50%  Extension 30%  Right lateral flexion 50%  Left lateral flexion 50%  Right rotation 50%  Left rotation 50%   (Blank rows = not tested)   LE MMT:   MMT Right 12/20/2021 Left 12/20/2021  Hip flexion 4/5 p! 4/5  Hip extension 4/5 4/5  Hip abduction 4+/5p! 4+/5  Hip adduction 4+/5 4+/5  Hip internal rotation      Hip external rotation      Knee flexion 4+/5 4+/5  Knee extension 4+/5 4+/5  Ankle dorsiflexion 4/5 4/5   (Blank rows = not tested)   LUMBAR SPECIAL TESTS:  Single leg stance test: Positive, Stork standing: Positive, and FABER test: Positive   FUNCTIONAL TESTS:  5 times sit to stand: 23s   GAIT: Distance walked: 44f Assistive device utilized: None Level of assistance: Complete Independence Comments: decreased step length, compensated Trendelenburg on R       TODAY'S TREATMENT    Pt seen for aquatic therapy today.  Treatment took place in water 3.25-4 ft [facility] Temp of water was 91.  Pt entered/exited the pool via stairs (step through pattern) independently with bilat rail.   -Walking forward, back, high knee marching, and side stepping with UE supported on yellow x 3 laps each - Holding yellow hand buoys 3.6 ft l: slow squats x 10 x 2 ; hip/knee flexion crossing midline and hip abdct/ knee flexion. Cues for abdominal bracing/core control - Leg swings- hip flexion/ hip ext x 10 each holding wall and yellow hand buoy; - Hip abdct/ add x 10 each in above position - kickboard  push/ pull 2x10 - kickboard push down 2 x 10 -holding rails at stairs: forward lunge with foot on 2nd step (for knee ROM) followed by hamstring stretch x 5 reps each LE;    -hands on wall:LB stretch (lumbar flexion stretch with LE straight); hip hiking R/L for QL stretch Seated 4th step (bottom). Add/abd 2x20   Walking between exercises for recovery with and without UE support Pt requires buoyancy for support and to offload joints with strengthening exercises. Viscosity of the water is needed for resistance of strengthening; water current perturbations provides challenge to standing balance unsupported, requiring increased core activation.   PATIENT EDUCATION:  Education details:  exercise progression, DOMS expectations, HEP Person educated: Patient Education method: Explanation, Demonstration, Tactile cues, Verbal cues, and Handouts Education comprehension: verbalized understanding, returned demonstration, verbal cues required, and tactile cues required     HOME EXERCISE PROGRAM: Access Code: [email or web address] URL: [email or web address] Date: 12/20/2021 Prepared by: Daleen Bo Verbally added supine fig 4 and seated quad stretch 2x/day, 10-30 sec, 2 reps (JCL)   ASSESSMENT:   CLINICAL IMPRESSION: Advanced standing exercises to increase core engagement. Cues for core engagement to assist with maintaining balance standing. Pt with many LOB recovered indep. Improvement with ambulation balance progressing to amb without UE support towards end of session.  Requires moderate VC for relaxation of shoulders and cervical spine throughout session (guarding). Some centralization of LBP as pt reports decreased pain sx into mid thigh. Pain continues in left LB and about groin and buttock area. Reduction of pain today from 6/10 ton 0/10.       OBJECTIVE IMPAIRMENTS Abnormal gait, decreased activity tolerance, decreased balance, decreased endurance, decreased knowledge of condition, decreased  mobility, difficulty walking, decreased ROM, decreased strength, hypomobility, increased fascial restrictions, increased muscle spasms, impaired flexibility, impaired sensation, improper body mechanics, postural dysfunction, and pain.    ACTIVITY LIMITATIONS cleaning, community activity, driving, laundry, yard work, shopping, and exercise .    PERSONAL FACTORS Age, Fitness, Time since onset of injury/illness/exacerbation, and 1-2 comorbidities:    are also affecting patient's functional outcome.      REHAB POTENTIAL: Fair     CLINICAL DECISION MAKING: Stable/uncomplicated   EVALUATION COMPLEXITY: Low     GOALS:     SHORT TERM GOALS: Target date: 01/31/2022       Pt will become independent with HEP in order to demonstrate synthesis of PT education.     Goal status: INITIAL   2.  Pt will be able to demonstrate/report ability to sit/stand/sleep for extended periods of time without pain in order to demonstrate functional improvement and tolerance to static positioning.    Goal status: INITIAL   3.  Pt will score at least 5 pt increase on FOTO to demonstrate functional improvement in MCII and pt perceived function.     Goal status: INITIAL     LONG TERM GOALS: Target date: 03/14/2022   Pt  will become independent with final HEP in order to demonstrate synthesis of PT education.     Goal status: INITIAL   2.  Pt will be able to demonstrate reciprocal stair stepping with single UE in order to demonstrate functional improvement in hip/LE function for self-care and house hold duties.    Goal status: INITIAL   3.  Pt will be able to demonstrate/report ability to walk >25 mins without pain in order to demonstrate functional improvement and tolerance to exercise and community mobility.    Goal status: INITIAL   4.  Pt will score >/= 64 on FOTO to demonstrate improvement in perceived lumbopelvic function.    Goal status: INITIAL       PLAN: PT FREQUENCY: 1-2x/week   PT  DURATION: 12 weeks   PLANNED INTERVENTIONS: Therapeutic exercises, Therapeutic activity, Neuromuscular re-education, Balance training, Gait training, Patient/Family education, Joint manipulation, Joint mobilization, Stair training, Orthotic/Fit training, DME instructions, Aquatic Therapy, Dry Needling, Electrical stimulation, Spinal manipulation, Spinal mobilization, Cryotherapy, Moist heat, scar mobilization, Taping, Vasopneumatic device, Traction, Ultrasound, Ionotophoresis '4mg'$ /ml Dexamethasone, Manual therapy, and Re-evaluation.   PLAN FOR NEXT SESSION: continue aquatic- bilat hip flexibility and strength, lumbopelvic strength   Stanton Kidney ([place]) Darianna Amy MPT 01/22/22  4:51 PM

## 2022-01-24 DIAGNOSIS — S76312A Strain of muscle, fascia and tendon of the posterior muscle group at thigh level, left thigh, initial encounter: Secondary | ICD-10-CM | POA: Diagnosis not present

## 2022-01-24 DIAGNOSIS — M25552 Pain in left hip: Secondary | ICD-10-CM | POA: Diagnosis not present

## 2022-01-25 ENCOUNTER — Ambulatory Visit (HOSPITAL_BASED_OUTPATIENT_CLINIC_OR_DEPARTMENT_OTHER): Payer: Medicare HMO | Admitting: Physical Therapy

## 2022-01-28 ENCOUNTER — Ambulatory Visit (HOSPITAL_BASED_OUTPATIENT_CLINIC_OR_DEPARTMENT_OTHER): Payer: Medicare HMO | Admitting: Physical Therapy

## 2022-01-30 ENCOUNTER — Ambulatory Visit (HOSPITAL_BASED_OUTPATIENT_CLINIC_OR_DEPARTMENT_OTHER): Payer: Medicare HMO | Admitting: Physical Therapy

## 2022-01-30 ENCOUNTER — Encounter (HOSPITAL_BASED_OUTPATIENT_CLINIC_OR_DEPARTMENT_OTHER): Payer: Self-pay | Admitting: Physical Therapy

## 2022-01-30 DIAGNOSIS — M5459 Other low back pain: Secondary | ICD-10-CM

## 2022-01-30 DIAGNOSIS — M6281 Muscle weakness (generalized): Secondary | ICD-10-CM | POA: Diagnosis not present

## 2022-01-30 DIAGNOSIS — R262 Difficulty in walking, not elsewhere classified: Secondary | ICD-10-CM

## 2022-01-30 NOTE — Therapy (Signed)
OUTPATIENT PHYSICAL THERAPY TREATMENT NOTE   Patient Name: Tina Neal MRN: [id number] DOB:[birth year], 71 y.o., female Today's Date: 01/30/2022  PCP: Josetta Huddle, MD REFERRING PROVIDER: Melina Schools, MD   END OF SESSION:   PT End of Session - 01/30/22 1506     Visit Number 6    Number of Visits 19    Date for PT Re-Evaluation 03/20/22    Authorization Type [employer] MCR    PT Start Time 1504    PT Stop Time 1545    PT Time Calculation (min) 41 min    Activity Tolerance Patient tolerated treatment well    Behavior During Therapy WFL for tasks assessed/performed              Past Medical History:  Diagnosis Date   Arthritis    Asthma    Diabetes mellitus without complication ([place])    GERD (gastroesophageal reflux disease)    Hyperlipidemia    Hypertension    Hypothyroidism    Pancreatitis    history   Past Surgical History:  Procedure Laterality Date   ABDOMINAL HYSTERECTOMY     ANTERIOR LAT LUMBAR FUSION N/A 08/29/2021   Procedure: EXTREME LATERAL INTERBODY FUSION LUMBAR TWO THROUGH FOUR;  Surgeon: Melina Schools, MD;  Location: [facility];  Service: Orthopedics;  Laterality: N/A;  3.5 hrs left tap block with exparel 3 C-Bed   BACK SURGERY  2011   lumbar fusion   CALCANEAL OSTEOTOMY Left 10/28/2013   Procedure: LEFT CALCANEAL OSTEOTOMY;  Surgeon: Wylene Simmer, MD;  Location: [facility];  Service: Orthopedics;  Laterality: Left;   CHOLECYSTECTOMY  2009   GASTROC RECESSION EXTREMITY Left 10/28/2013   Procedure: LEFT GASTROCNEMIUS RECESSION ;  Surgeon: Wylene Simmer, MD;  Location: [facility];  Service: Orthopedics;  Laterality: Left;   JOINT REPLACEMENT     bilat total knees   KNEE SURGERY     right   rotator cuff     rt   TENDON TRANSFER Left 10/28/2013   Procedure: LEFT FLEXOR DIGITORUM LONGUS TRANSFER, POSTERIOR TIBIAL TENOLYSIS;  Surgeon: Wylene Simmer, MD;  Location: [facility];  Service: Orthopedics;  Laterality:  Left;   THYROIDECTOMY     Patient Active Problem List   Diagnosis Date Noted   S/P lumbar fusion 08/30/2021   Spinal stenosis 08/29/2021   Calcific tendinitis of left shoulder 06/22/2012   Acromioclavicular arthrosis 06/22/2012   Postlaminectomy syndrome, lumbar region 01/28/2012   Disorders of sacrum 01/28/2012   Other chronic postoperative pain 01/28/2012   Pancreatitis 07/13/2011   Hypertension 07/13/2011   COPD (chronic obstructive pulmonary disease) ([place]) 07/13/2011    REFERRING DIAG: Z48.89 (ICD-10-CM) - Encounter for other specified surgical aftercare   THERAPY DIAG:  Other low back pain  Muscle weakness (generalized)  Difficulty walking  Rationale for Evaluation and Treatment Rehabilitation     PRECAUTIONS: back  SUBJECTIVE: Pt reports that she slipped in a clients garage.  She caught herself before her she fell.  Heard a Pop (in Lt high hamstring area). Went to Orthopedic dr and had xray, "nothings broken, nothings torn".   PERTINENT HISTORY:  Bilat TKA, R RC repair, COPD, DM, HTN   PAIN:  Are you having pain? yes  NPRS scale: 3/10 (took pain medication prior to session) Pain location: bilateral thigh and ankles Pain description: achey, never sharp  Aggravating factors: stairs, bending, lifting, transfers, movement  Relieving factors: medication/pain med, icy-hot      PRECAUTIONS: Back  WEIGHT BEARING RESTRICTIONS No   FALLS:  Has patient fallen in last 6 months? 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      OBJECTIVE: * Findings taken at eval unless otherwise noted.     DIAGNOSTIC FINDINGS:  IMPRESSION: Fluoroscopic guidance provided during surgical  fusion of L2-3 and L3-4.   PATIENT SURVEYS:  FOTO 59 64 at DC 5 pts MCII   SCREENING FOR RED FLAGS: Bowel or bladder incontinence: No Spinal tumors: No Cauda equina syndrome: No Compression fracture: No Abdominal aneurysm: No   COGNITION:           Overall cognitive status: Within functional limits for tasks assessed                          SENSATION: WFL   POSTURE:  Mild fwd flexion in standing, L SB in seated, decreased lumbar lordosis in standing   PALPATION: TTP of bilat L/S paraspinals with R>L, expected joint stiffness following lumbar fusion   LUMBAR ROM:    Active  A/PROM  12/20/2021  Flexion 50%  Extension 30%  Right lateral flexion 50%  Left lateral flexion 50%  Right rotation 50%  Left rotation 50%   (Blank rows = not tested)   LE MMT:   MMT Right 12/20/2021 Left 12/20/2021  Hip flexion 4/5 p! 4/5  Hip extension 4/5 4/5  Hip abduction 4+/5p! 4+/5  Hip adduction 4+/5 4+/5  Hip internal rotation      Hip external rotation      Knee flexion 4+/5 4+/5  Knee extension 4+/5 4+/5  Ankle dorsiflexion 4/5 4/5   (Blank rows = not tested)   LUMBAR SPECIAL TESTS:  Single leg stance test: Positive, Stork standing: Positive, and FABER test: Positive   FUNCTIONAL TESTS:  5 times sit to stand: 23s   GAIT: Distance walked: 60f Assistive device utilized: None Level of assistance: Complete Independence Comments: decreased step length, compensated Trendelenburg on R       TODAY'S TREATMENT    Pt seen for aquatic therapy today.  Treatment took place in water 3.25-4 ft [facility] Temp of water was 91.  Pt entered/exited the pool via stairs (step through pattern) independently with bilat rail.   -Walking forward, back, high knee marching, and side stepping with UE supported on yellow x 3 laps each -Holding wall: squats x 15;  Hip abdct/ add x 10 each; Leg swings- hip flexion/ hip ext x 10 each holding wall and blue hand  buoy - holding yellow noodle: hip/knee flexion crossing midline x 10 each - tandem stance holding yellow noodle; repeated with intermittent UE on wall 10-15s - staggered stance with kickboard push/ pull 2x10 - kickboard push down 5 sec, x10 -hands on wall:heel/toe raises; SLS - with intermittent UE to steady  -holding rails at stairs: LB stretch (lumbar flexion stretch with LE straight);forward lunge with foot on 2nd step (for knee ROM) followed by hamstring stretch x 2 reps each LE - high knee forward marching holding yellow noodle -side stepping (wide steps) holding yellow noodle  -self care: demonstrated using roller to hamstring for self massage. Pt  verbalized understanding.   Pt requires buoyancy for support and to offload joints with strengthening exercises. Viscosity of the water is needed for resistance of strengthening; water current perturbations provides challenge to standing balance unsupported, requiring increased core activation.   PATIENT EDUCATION:  Education details:  exercise progression, DOMS expectations, HEP Person educated: Patient Education method: Explanation, Demonstration, Tactile cues, Verbal cues, and Handouts Education comprehension: verbalized understanding, returned demonstration, verbal cues required, and tactile cues required     HOME EXERCISE PROGRAM: Access Code: [email or web address] URL: [email or web address] Date: 12/20/2021 Prepared by: Daleen Bo Verbally added supine fig 4 and seated quad stretch 2x/day, 10-30 sec, 2 reps (JCL)   ASSESSMENT:   CLINICAL IMPRESSION: Pt demonstrates decreased balance with Lt tandem stance and Lt SLS.  She reported reduction of LLE pain while in the water.  Minor cues for upright posture while walking forward/ backward.  Making progress towards LTGs.     OBJECTIVE IMPAIRMENTS Abnormal gait, decreased activity tolerance, decreased balance, decreased endurance, decreased knowledge of condition, decreased mobility,  difficulty walking, decreased ROM, decreased strength, hypomobility, increased fascial restrictions, increased muscle spasms, impaired flexibility, impaired sensation, improper body mechanics, postural dysfunction, and pain.    ACTIVITY LIMITATIONS cleaning, community activity, driving, laundry, yard work, shopping, and exercise .    PERSONAL FACTORS Age, Fitness, Time since onset of injury/illness/exacerbation, and 1-2 comorbidities:    are also affecting patient's functional outcome.      REHAB POTENTIAL: Fair     CLINICAL DECISION MAKING: Stable/uncomplicated   EVALUATION COMPLEXITY: Low     GOALS:     SHORT TERM GOALS: Target date: 01/31/2022       Pt will become independent with HEP in order to demonstrate synthesis of PT education.     Goal status: INITIAL   2.  Pt will be able to demonstrate/report ability to sit/stand/sleep for extended periods of time without pain in order to demonstrate functional improvement and tolerance to static positioning.    Goal status: INITIAL   3.  Pt will score at least 5 pt increase on FOTO to demonstrate functional improvement in MCII and pt perceived function.     Goal status: INITIAL     LONG TERM GOALS: Target date: 03/14/2022   Pt  will become independent with final HEP in order to demonstrate synthesis of PT education.     Goal status: INITIAL   2.  Pt will be able to demonstrate reciprocal stair stepping with single UE in order to demonstrate functional improvement in hip/LE function for self-care and house hold duties.    Goal status: INITIAL   3.  Pt will be able to demonstrate/report ability to walk >25 mins without pain in order to demonstrate functional improvement and tolerance to exercise and community mobility.    Goal status: INITIAL   4.  Pt will score >/= 64 on FOTO to demonstrate improvement in perceived lumbopelvic function.    Goal status: INITIAL       PLAN: PT FREQUENCY: 1-2x/week   PT DURATION: 12  weeks   PLANNED INTERVENTIONS: Therapeutic exercises, Therapeutic activity, Neuromuscular re-education, Balance training, Gait training, Patient/Family education, Joint manipulation, Joint mobilization, Stair training, Orthotic/Fit training, DME instructions, Aquatic Therapy, Dry Needling, Electrical stimulation, Spinal manipulation, Spinal mobilization, Cryotherapy, Moist heat, scar mobilization, Taping, Vasopneumatic device, Traction, Ultrasound, Ionotophoresis '4mg'$ /ml Dexamethasone, Manual therapy, and Re-evaluation.   PLAN FOR NEXT SESSION: continue aquatic- bilat hip flexibility and strength, lumbopelvic strength, balance with LLE      Kerin Perna, PTA  01/30/22 3:45 PM

## 2022-01-31 ENCOUNTER — Encounter (HOSPITAL_BASED_OUTPATIENT_CLINIC_OR_DEPARTMENT_OTHER): Payer: Self-pay | Admitting: Physical Therapy

## 2022-01-31 ENCOUNTER — Ambulatory Visit (HOSPITAL_BASED_OUTPATIENT_CLINIC_OR_DEPARTMENT_OTHER): Payer: Medicare HMO | Admitting: Physical Therapy

## 2022-01-31 DIAGNOSIS — M6281 Muscle weakness (generalized): Secondary | ICD-10-CM | POA: Diagnosis not present

## 2022-01-31 DIAGNOSIS — R262 Difficulty in walking, not elsewhere classified: Secondary | ICD-10-CM | POA: Diagnosis not present

## 2022-01-31 DIAGNOSIS — M5459 Other low back pain: Secondary | ICD-10-CM

## 2022-01-31 NOTE — Therapy (Addendum)
OUTPATIENT PHYSICAL THERAPY TREATMENT NOTE  PHYSICAL THERAPY DISCHARGE SUMMARY  Visits from Start of Care: 7  Current functional level related to goals / functional outcomes: Pt indep with functional mobility and ADL's without AD   Remaining deficits: unknown   Education / Equipment: Management of condition/ HEP  Patient agrees to discharge. Patient goals were partially met. Patient is being discharged due to not returning since the last visit.  Patient Name: AIDAH FORQUER MRN: [id number] DOB:1951/07/13, 71 y.o., female Today's Date: 01/31/2022  PCP: Josetta Huddle, MD REFERRING PROVIDER: Melina Schools, MD   END OF SESSION:   PT End of Session - 01/31/22 1713     Visit Number 7    Number of Visits 19    Date for PT Re-Evaluation 03/20/22    Authorization Type [employer] MCR    PT Start Time 1701    PT Stop Time 8921    PT Time Calculation (min) 44 min    Activity Tolerance Patient tolerated treatment well    Behavior During Therapy WFL for tasks assessed/performed               Past Medical History:  Diagnosis Date   Arthritis    Asthma    Diabetes mellitus without complication ([place])    GERD (gastroesophageal reflux disease)    Hyperlipidemia    Hypertension    Hypothyroidism    Pancreatitis    history   Past Surgical History:  Procedure Laterality Date   ABDOMINAL HYSTERECTOMY     ANTERIOR LAT LUMBAR FUSION N/A 08/29/2021   Procedure: EXTREME LATERAL INTERBODY FUSION LUMBAR TWO THROUGH FOUR;  Surgeon: Melina Schools, MD;  Location: [place];  Service: Orthopedics;  Laterality: N/A;  3.5 hrs left tap block with exparel 3 C-Bed   BACK SURGERY  2011   lumbar fusion   CALCANEAL OSTEOTOMY Left 10/28/2013   Procedure: LEFT CALCANEAL OSTEOTOMY;  Surgeon: Wylene Simmer, MD;  Location: [place];  Service: Orthopedics;  Laterality: Left;   CHOLECYSTECTOMY  2009   GASTROC RECESSION EXTREMITY Left 10/28/2013   Procedure: LEFT GASTROCNEMIUS RECESSION  ;  Surgeon: Wylene Simmer, MD;  Location: [place];  Service: Orthopedics;  Laterality: Left;   JOINT REPLACEMENT     bilat total knees   KNEE SURGERY     right   rotator cuff     rt   TENDON TRANSFER Left 10/28/2013   Procedure: LEFT FLEXOR DIGITORUM LONGUS TRANSFER, POSTERIOR TIBIAL TENOLYSIS;  Surgeon: Wylene Simmer, MD;  Location: [place];  Service: Orthopedics;  Laterality: Left;   THYROIDECTOMY     Patient Active Problem List   Diagnosis Date Noted   S/P lumbar fusion 08/30/2021   Spinal stenosis 08/29/2021   Calcific tendinitis of left shoulder 06/22/2012   Acromioclavicular arthrosis 06/22/2012   Postlaminectomy syndrome, lumbar region 01/28/2012   Disorders of sacrum 01/28/2012   Other chronic postoperative pain 01/28/2012   Pancreatitis 07/13/2011   Hypertension 07/13/2011   COPD (chronic obstructive pulmonary disease) ([place]) 07/13/2011    REFERRING DIAG: Z48.89 (ICD-10-CM) - Encounter for other specified surgical aftercare   THERAPY DIAG:  Other low back pain  Muscle weakness (generalized)  Difficulty walking  Rationale for Evaluation and Treatment Rehabilitation     PRECAUTIONS: back  SUBJECTIVE: No residual issue from episode in clients garage.  "No pain in thighs and my shoulder seems to be better since I've been coming here.. "  PERTINENT HISTORY:  Bilat TKA, R RC repair, COPD,  DM, HTN   PAIN:  Are you having pain? no  NPRS scale: 0/10 (took pain medication prior to session) Pain location: bilateral thigh and ankles Pain description: achey, never sharp  Aggravating factors: stairs, bending, lifting, transfers, movement  Relieving factors: medication/pain med, icy-hot      PRECAUTIONS: Back   WEIGHT BEARING RESTRICTIONS No   FALLS:  Has patient fallen in last 6 months? 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      OBJECTIVE: * Findings taken at eval unless otherwise noted.     DIAGNOSTIC FINDINGS:  IMPRESSION: Fluoroscopic guidance provided during surgical fusion of L2-3 and L3-4.   PATIENT SURVEYS:  FOTO 59 64 at DC 5 pts MCII   SCREENING FOR RED FLAGS: Bowel or bladder incontinence: No Spinal tumors: No Cauda equina syndrome: No Compression fracture: No Abdominal aneurysm: No   COGNITION:           Overall cognitive status: Within functional limits for tasks assessed                          SENSATION: WFL   POSTURE:  Mild fwd flexion in standing, L SB in seated, decreased lumbar lordosis in standing   PALPATION: TTP of bilat L/S paraspinals with R>L, expected joint stiffness following lumbar fusion   LUMBAR ROM:    Active  A/PROM  12/20/2021  Flexion 50%  Extension 30%  Right lateral flexion 50%  Left lateral flexion 50%  Right rotation 50%  Left rotation 50%   (Blank rows = not tested)   LE MMT:   MMT Right 12/20/2021 Left 12/20/2021  Hip flexion 4/5 p! 4/5  Hip extension 4/5 4/5  Hip abduction 4+/5p! 4+/5  Hip adduction 4+/5 4+/5  Hip internal rotation      Hip external rotation      Knee flexion 4+/5 4+/5  Knee extension 4+/5 4+/5  Ankle dorsiflexion 4/5 4/5   (Blank rows = not tested)   LUMBAR SPECIAL TESTS:  Single leg stance test: Positive, Stork standing: Positive, and FABER test: Positive   FUNCTIONAL TESTS:  5 times sit to stand: 23s   GAIT: Distance walked: 64f Assistive device utilized: None Level of assistance: Complete Independence Comments: decreased step length, compensated Trendelenburg on R       TODAY'S TREATMENT    Pt seen for aquatic therapy today.  Treatment took place in water 3.25-4 ft in [facility]  Temp of water was 91.  Pt entered/exited the pool via stairs (step through pattern) independently with bilat rail.   -Walking forward, back, high knee marching, and side stepping with UE supported on yellow x 3 laps each -Holding wall: squats x 15;  Hip abdct/ add x 10 each; Leg swings- hip flexion/ hip ext x 10 each holding wall  - squoodle pull down 2x10 for core engagement - staggered stance with kickboard push/ pull 10/12/2 2x10 reps - tandem stance holding yellow noodle; repeated with intermittent UE on wall 10-15s -holding to yellow noodle:heel/toe raises; 3 way touch x10 R/L -holding rails at stairs: forward lunge with foot on 2nd step (for knee ROM) followed  by hamstring stretch x 2 reps each LE - high knee forward marching holding yellow noodle   Pt requires buoyancy for support and to offload joints with strengthening exercises. Viscosity of the water is needed for resistance of strengthening; water current perturbations provides challenge to standing balance unsupported, requiring increased core activation.   PATIENT EDUCATION:  Education details:  exercise progression, DOMS expectations, HEP Person educated: Patient Education method: Explanation, Demonstration, Tactile cues, Verbal cues, and Handouts Education comprehension: verbalized understanding, returned demonstration, verbal cues required, and tactile cues required     HOME EXERCISE PROGRAM: Access Code: [email or web address] URL: [email or web address] Date: 12/20/2021 Prepared by: Daleen Bo Verbally added supine fig 4 and seated quad stretch 2x/day, 10-30 sec, 2 reps (JCL)   ASSESSMENT:   CLINICAL IMPRESSION: Pt has recovered nicely from episode ~ 2weeks ago.  No residual pain or dysfunction.  Pt reports no pain today, great improvement since initiation of services. Pt has met STG #2 tolerating extended periods of time without pain. Foto to be completed next visit. Goals ongoing.    OBJECTIVE IMPAIRMENTS  Abnormal gait, decreased activity tolerance, decreased balance, decreased endurance, decreased knowledge of condition, decreased mobility, difficulty walking, decreased ROM, decreased strength, hypomobility, increased fascial restrictions, increased muscle spasms, impaired flexibility, impaired sensation, improper body mechanics, postural dysfunction, and pain.    ACTIVITY LIMITATIONS cleaning, community activity, driving, laundry, yard work, shopping, and exercise .    PERSONAL FACTORS Age, Fitness, Time since onset of injury/illness/exacerbation, and 1-2 comorbidities:    are also affecting patient's functional outcome.      REHAB POTENTIAL: Fair     CLINICAL DECISION MAKING: Stable/uncomplicated   EVALUATION COMPLEXITY: Low     GOALS:     SHORT TERM GOALS: Target date: 01/31/2022       Pt will become independent with HEP in order to demonstrate synthesis of PT education.     Goal status: INITIAL   2.  Pt will be able to demonstrate/report ability to sit/stand/sleep for extended periods of time without pain in order to demonstrate functional improvement and tolerance to static positioning.    Goal status: Achieved   3.  Pt will score at least 5 pt increase on FOTO to demonstrate functional improvement in MCII and pt perceived function.     Goal status: INITIAL     LONG TERM GOALS: Target date: 03/14/2022   Pt  will become independent with final HEP in order to demonstrate synthesis of PT education.     Goal status: INITIAL   2.  Pt will be able to demonstrate reciprocal stair stepping with single UE in order to demonstrate functional improvement in hip/LE function for self-care and house hold duties.    Goal status: INITIAL   3.  Pt will be able to demonstrate/report ability to walk >25 mins without pain in order to demonstrate functional improvement and tolerance to exercise and community mobility.    Goal status: INITIAL   4.  Pt will score >/= 64 on FOTO to  demonstrate improvement in perceived lumbopelvic function.    Goal status: INITIAL       PLAN: PT FREQUENCY: 1-2x/week   PT DURATION: 12 weeks   PLANNED INTERVENTIONS: Therapeutic exercises, Therapeutic activity, Neuromuscular re-education, Balance training, Gait training, Patient/Family education, Joint manipulation, Joint mobilization, Stair training, Orthotic/Fit training, DME instructions, Aquatic Therapy, Dry Needling, Electrical stimulation, Spinal manipulation, Spinal mobilization, Cryotherapy, Moist heat, scar mobilization, Taping, Vasopneumatic device, Traction, Ultrasound, Ionotophoresis 29m/ml Dexamethasone, Manual therapy, and Re-evaluation.   PLAN  FOR NEXT SESSION: FOTO. continue aquatic- bilat hip flexibility and strength, lumbopelvic strength, balance with LLE    Stanton Kidney (Frankie) Ziemba MPT 01/31/22 6:05 PM  Addended Stanton Kidney Tharon Aquas) Ziemba MPT 06/20/22 420p

## 2022-02-04 ENCOUNTER — Ambulatory Visit (HOSPITAL_BASED_OUTPATIENT_CLINIC_OR_DEPARTMENT_OTHER): Payer: Medicare HMO | Admitting: Physical Therapy

## 2022-02-08 ENCOUNTER — Ambulatory Visit (HOSPITAL_BASED_OUTPATIENT_CLINIC_OR_DEPARTMENT_OTHER): Payer: Medicare HMO | Admitting: Physical Therapy

## 2022-02-11 DIAGNOSIS — M79671 Pain in right foot: Secondary | ICD-10-CM | POA: Diagnosis not present

## 2022-02-11 DIAGNOSIS — M76821 Posterior tibial tendinitis, right leg: Secondary | ICD-10-CM | POA: Diagnosis not present

## 2022-02-13 ENCOUNTER — Ambulatory Visit (HOSPITAL_BASED_OUTPATIENT_CLINIC_OR_DEPARTMENT_OTHER): Payer: Medicare HMO | Admitting: Physical Therapy

## 2022-02-15 ENCOUNTER — Ambulatory Visit (HOSPITAL_BASED_OUTPATIENT_CLINIC_OR_DEPARTMENT_OTHER): Payer: Medicare HMO | Admitting: Physical Therapy

## 2022-02-19 ENCOUNTER — Ambulatory Visit (HOSPITAL_BASED_OUTPATIENT_CLINIC_OR_DEPARTMENT_OTHER): Payer: Medicare HMO | Admitting: Physical Therapy

## 2022-02-22 ENCOUNTER — Ambulatory Visit (HOSPITAL_BASED_OUTPATIENT_CLINIC_OR_DEPARTMENT_OTHER): Payer: Medicare HMO | Admitting: Physical Therapy

## 2022-02-22 DIAGNOSIS — Z4889 Encounter for other specified surgical aftercare: Secondary | ICD-10-CM | POA: Diagnosis not present

## 2022-02-26 ENCOUNTER — Ambulatory Visit (HOSPITAL_BASED_OUTPATIENT_CLINIC_OR_DEPARTMENT_OTHER): Payer: Medicare HMO | Admitting: Physical Therapy

## 2022-02-28 ENCOUNTER — Ambulatory Visit (HOSPITAL_BASED_OUTPATIENT_CLINIC_OR_DEPARTMENT_OTHER): Payer: Medicare HMO | Admitting: Physical Therapy

## 2022-03-05 ENCOUNTER — Ambulatory Visit (HOSPITAL_BASED_OUTPATIENT_CLINIC_OR_DEPARTMENT_OTHER): Payer: Medicare HMO | Admitting: Physical Therapy

## 2022-03-07 ENCOUNTER — Ambulatory Visit (HOSPITAL_BASED_OUTPATIENT_CLINIC_OR_DEPARTMENT_OTHER): Payer: Medicare HMO | Admitting: Physical Therapy

## 2022-03-12 ENCOUNTER — Ambulatory Visit (HOSPITAL_BASED_OUTPATIENT_CLINIC_OR_DEPARTMENT_OTHER): Payer: Medicare HMO | Admitting: Physical Therapy

## 2022-03-13 DIAGNOSIS — S76312D Strain of muscle, fascia and tendon of the posterior muscle group at thigh level, left thigh, subsequent encounter: Secondary | ICD-10-CM | POA: Diagnosis not present

## 2022-03-13 DIAGNOSIS — M25561 Pain in right knee: Secondary | ICD-10-CM | POA: Diagnosis not present

## 2022-03-13 DIAGNOSIS — Z96651 Presence of right artificial knee joint: Secondary | ICD-10-CM | POA: Diagnosis not present

## 2022-03-14 ENCOUNTER — Ambulatory Visit (HOSPITAL_BASED_OUTPATIENT_CLINIC_OR_DEPARTMENT_OTHER): Payer: Medicare HMO | Admitting: Physical Therapy

## 2022-03-19 ENCOUNTER — Ambulatory Visit (HOSPITAL_BASED_OUTPATIENT_CLINIC_OR_DEPARTMENT_OTHER): Payer: Medicare HMO | Admitting: Physical Therapy

## 2022-04-05 IMAGING — RF DG LUMBAR SPINE 2-3V
1 series · 3 of 3 positions shown · non-contrast
Comparison: February 13, 2016.

CLINICAL DATA: Surgical fusion of L2-3 and L3-4

EXAM:
LUMBAR SPINE - 2-3 VIEW; DG C-ARM 1-60 MIN-NO REPORT
Radiation exposure index: 159.25 mGy.

[Series 1: run · 3 of 3 slices shown]
[im 1/3]
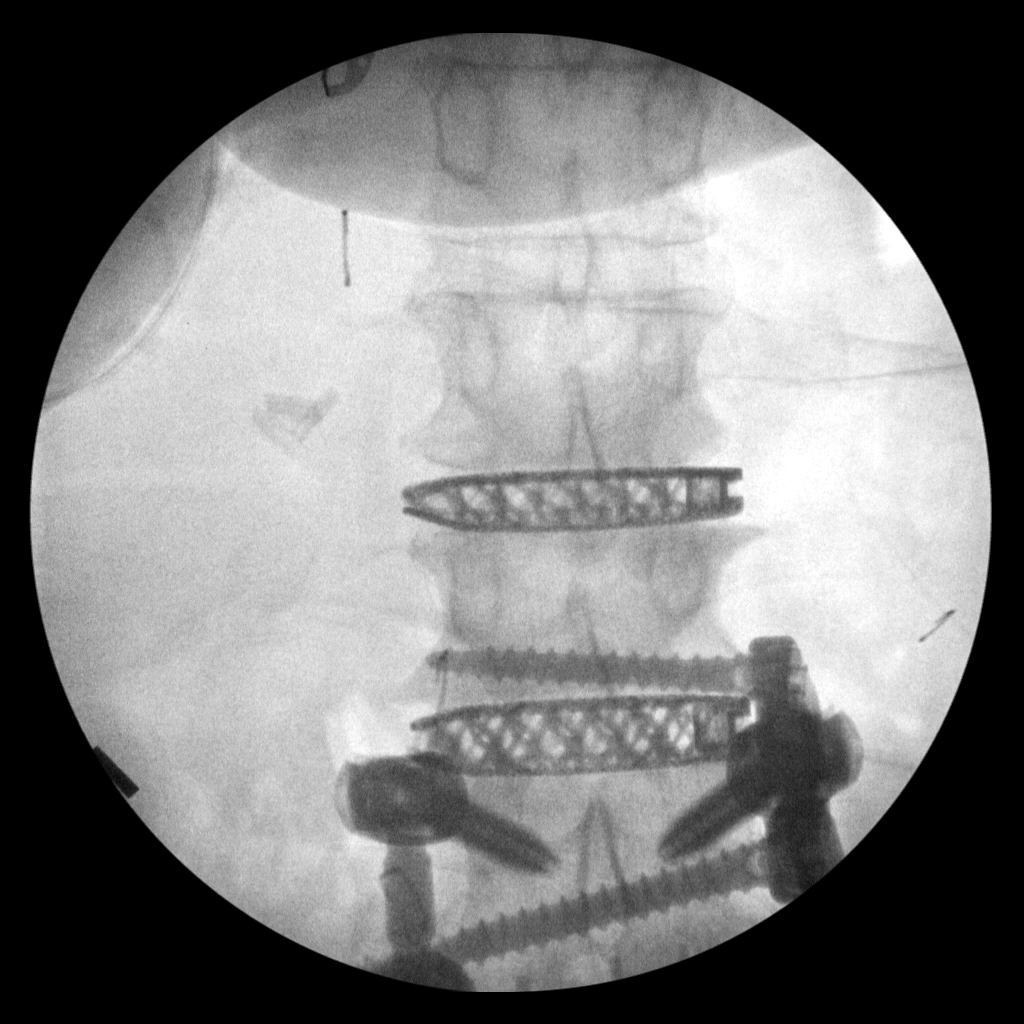
[im 2/3]
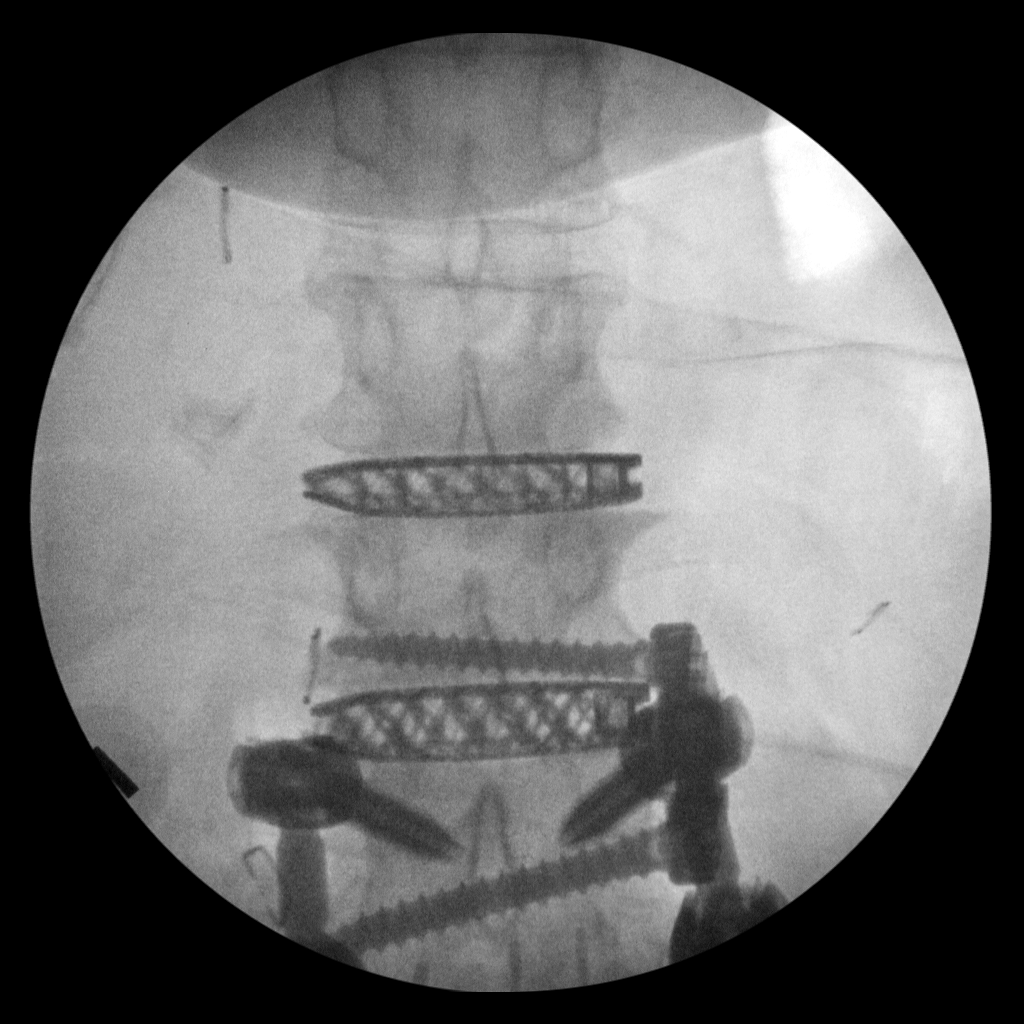
[im 3/3]
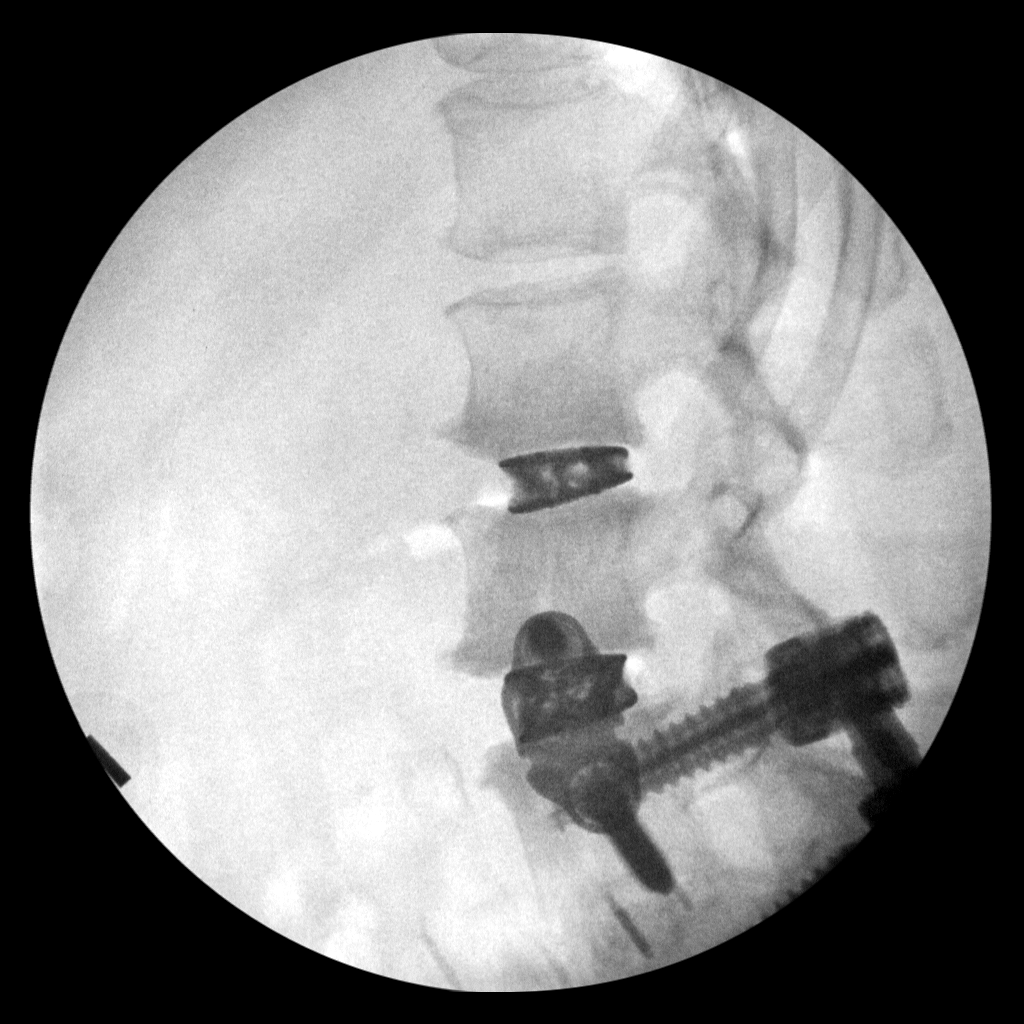

[3 of 3 positions shown; findings below may reference images not displayed]

FINDINGS: Three intraoperative fluoroscopic images were obtained of the lumbar
spine. These images demonstrate previous surgical posterior fusion
of L4-5. Interval interbody fusion of L2-3 and L3-4 is noted.
IMPRESSION: Fluoroscopic guidance provided during surgical fusion of L2-3 and
L3-4.

## 2022-04-16 DIAGNOSIS — Z5181 Encounter for therapeutic drug level monitoring: Secondary | ICD-10-CM | POA: Diagnosis not present

## 2022-04-16 DIAGNOSIS — M5416 Radiculopathy, lumbar region: Secondary | ICD-10-CM | POA: Diagnosis not present

## 2022-04-16 DIAGNOSIS — M961 Postlaminectomy syndrome, not elsewhere classified: Secondary | ICD-10-CM | POA: Diagnosis not present

## 2022-04-16 DIAGNOSIS — M545 Low back pain, unspecified: Secondary | ICD-10-CM | POA: Diagnosis not present

## 2022-04-16 DIAGNOSIS — Z981 Arthrodesis status: Secondary | ICD-10-CM | POA: Diagnosis not present

## 2022-04-29 DIAGNOSIS — Z23 Encounter for immunization: Secondary | ICD-10-CM | POA: Diagnosis not present

## 2022-04-29 DIAGNOSIS — R350 Frequency of micturition: Secondary | ICD-10-CM | POA: Diagnosis not present

## 2022-04-29 DIAGNOSIS — I700 Atherosclerosis of aorta: Secondary | ICD-10-CM | POA: Diagnosis not present

## 2022-04-29 DIAGNOSIS — J449 Chronic obstructive pulmonary disease, unspecified: Secondary | ICD-10-CM | POA: Diagnosis not present

## 2022-04-29 DIAGNOSIS — K219 Gastro-esophageal reflux disease without esophagitis: Secondary | ICD-10-CM | POA: Diagnosis not present

## 2022-04-29 DIAGNOSIS — E1143 Type 2 diabetes mellitus with diabetic autonomic (poly)neuropathy: Secondary | ICD-10-CM | POA: Diagnosis not present

## 2022-04-29 DIAGNOSIS — E782 Mixed hyperlipidemia: Secondary | ICD-10-CM | POA: Diagnosis not present

## 2022-04-29 DIAGNOSIS — E7800 Pure hypercholesterolemia, unspecified: Secondary | ICD-10-CM | POA: Diagnosis not present

## 2022-04-29 DIAGNOSIS — E039 Hypothyroidism, unspecified: Secondary | ICD-10-CM | POA: Diagnosis not present

## 2022-04-29 DIAGNOSIS — I10 Essential (primary) hypertension: Secondary | ICD-10-CM | POA: Diagnosis not present

## 2022-04-29 DIAGNOSIS — J4520 Mild intermittent asthma, uncomplicated: Secondary | ICD-10-CM | POA: Diagnosis not present

## 2022-05-31 ENCOUNTER — Telehealth: Payer: Self-pay

## 2022-05-31 NOTE — Patient Outreach (Signed)
  Care Coordination   Initial Visit Note   05/31/2022 Name: Tina Neal MRN: [id number] DOB: Dec 19, 1950  Tina Neal is a 71 y.o. year old female who sees Josetta Huddle, MD for primary care. I spoke with  Army [place] by phone today.  What matters to the patients health and wellness today?  No concerns today    Goals Addressed             This Visit's Progress    COMPLETED: Care Coordination Activities - no follow up required       Care Coordination Interventions: Provided education to patient re: care coordination services, Annual Wellness Visit Assessed social determinant of health barriers Completed Annual Wellness Visit 11/26/21          SDOH assessments and interventions completed:  Yes  SDOH Interventions Today    Flowsheet Row Most Recent Value  SDOH Interventions   Food Insecurity Interventions Intervention Not Indicated  Housing Interventions Intervention Not Indicated  Transportation Interventions Intervention Not Indicated  Utilities Interventions Intervention Not Indicated  Financial Strain Interventions Intervention Not Indicated        Care Coordination Interventions Activated:  Yes  Care Coordination Interventions:  Yes, provided   Follow up plan: No further intervention required.   Encounter Outcome:  Pt. Visit Completed  Peter Garter RN, BSN,CCM, CDE Care Management Coordinator [place] [facility] [phone]

## 2022-05-31 NOTE — Patient Instructions (Signed)
Visit Information  Thank you for taking time to visit with me today. Please don't hesitate to contact me if I can be of assistance to you.   Following are the goals we discussed today:   Goals Addressed             This Visit's Progress    COMPLETED: Care Coordination Activities - no follow up required       Care Coordination Interventions: Provided education to patient re: care coordination services, Annual Wellness Visit Assessed social determinant of health barriers Completed Annual Wellness Visit 11/26/21           If you are experiencing a Mental Health or [place] or need someone to talk to, please call the Suicide and Crisis Lifeline: 988 call the [place] National Suicide Prevention Lifeline: (586) 285-4190 or TTY: (508)806-5522 TTY 445-557-0946) to talk to a trained counselor call 1-800-273-TALK (toll free, 24 hour hotline) go to [facility] [place] [phone]) call 911   The patient verbalized understanding of instructions, educational materials, and care plan provided today and DECLINED offer to receive copy of patient instructions, educational materials, and care plan.   No further follow up required:    Peter Garter RN, Jackquline Denmark, [place] [facility] [phone]

## 2022-06-14 DIAGNOSIS — H524 Presbyopia: Secondary | ICD-10-CM | POA: Diagnosis not present

## 2022-06-14 DIAGNOSIS — E119 Type 2 diabetes mellitus without complications: Secondary | ICD-10-CM | POA: Diagnosis not present

## 2022-06-14 DIAGNOSIS — H2513 Age-related nuclear cataract, bilateral: Secondary | ICD-10-CM | POA: Diagnosis not present

## 2022-06-19 DIAGNOSIS — Z1231 Encounter for screening mammogram for malignant neoplasm of breast: Secondary | ICD-10-CM | POA: Diagnosis not present

## 2022-07-17 DIAGNOSIS — E782 Mixed hyperlipidemia: Secondary | ICD-10-CM | POA: Diagnosis not present

## 2022-07-17 DIAGNOSIS — Z4889 Encounter for other specified surgical aftercare: Secondary | ICD-10-CM | POA: Diagnosis not present

## 2022-07-17 DIAGNOSIS — J4520 Mild intermittent asthma, uncomplicated: Secondary | ICD-10-CM | POA: Diagnosis not present

## 2022-07-17 DIAGNOSIS — E1143 Type 2 diabetes mellitus with diabetic autonomic (poly)neuropathy: Secondary | ICD-10-CM | POA: Diagnosis not present

## 2022-07-17 DIAGNOSIS — E039 Hypothyroidism, unspecified: Secondary | ICD-10-CM | POA: Diagnosis not present

## 2022-07-17 DIAGNOSIS — I10 Essential (primary) hypertension: Secondary | ICD-10-CM | POA: Diagnosis not present

## 2022-07-17 DIAGNOSIS — J449 Chronic obstructive pulmonary disease, unspecified: Secondary | ICD-10-CM | POA: Diagnosis not present

## 2022-07-17 DIAGNOSIS — K219 Gastro-esophageal reflux disease without esophagitis: Secondary | ICD-10-CM | POA: Diagnosis not present

## 2022-08-01 DIAGNOSIS — J069 Acute upper respiratory infection, unspecified: Secondary | ICD-10-CM | POA: Diagnosis not present

## 2022-08-01 DIAGNOSIS — U071 COVID-19: Secondary | ICD-10-CM | POA: Diagnosis not present

## 2022-09-13 DIAGNOSIS — M25561 Pain in right knee: Secondary | ICD-10-CM | POA: Diagnosis not present

## 2022-09-13 DIAGNOSIS — Z96651 Presence of right artificial knee joint: Secondary | ICD-10-CM | POA: Diagnosis not present

## 2022-09-16 DIAGNOSIS — Z79899 Other long term (current) drug therapy: Secondary | ICD-10-CM | POA: Diagnosis not present

## 2022-09-16 DIAGNOSIS — M5416 Radiculopathy, lumbar region: Secondary | ICD-10-CM | POA: Diagnosis not present

## 2022-09-16 DIAGNOSIS — Z981 Arthrodesis status: Secondary | ICD-10-CM | POA: Diagnosis not present

## 2022-09-16 DIAGNOSIS — M961 Postlaminectomy syndrome, not elsewhere classified: Secondary | ICD-10-CM | POA: Diagnosis not present

## 2022-09-16 DIAGNOSIS — Z5181 Encounter for therapeutic drug level monitoring: Secondary | ICD-10-CM | POA: Diagnosis not present

## 2022-10-23 DIAGNOSIS — H5203 Hypermetropia, bilateral: Secondary | ICD-10-CM | POA: Diagnosis not present

## 2022-10-23 DIAGNOSIS — H524 Presbyopia: Secondary | ICD-10-CM | POA: Diagnosis not present

## 2022-10-23 DIAGNOSIS — M25561 Pain in right knee: Secondary | ICD-10-CM | POA: Diagnosis not present

## 2022-10-23 DIAGNOSIS — H5213 Myopia, bilateral: Secondary | ICD-10-CM | POA: Diagnosis not present

## 2022-10-23 DIAGNOSIS — H52209 Unspecified astigmatism, unspecified eye: Secondary | ICD-10-CM | POA: Diagnosis not present

## 2022-12-04 DIAGNOSIS — R1032 Left lower quadrant pain: Secondary | ICD-10-CM | POA: Diagnosis not present

## 2022-12-04 DIAGNOSIS — K5792 Diverticulitis of intestine, part unspecified, without perforation or abscess without bleeding: Secondary | ICD-10-CM | POA: Diagnosis not present

## 2022-12-10 DIAGNOSIS — E119 Type 2 diabetes mellitus without complications: Secondary | ICD-10-CM | POA: Diagnosis not present

## 2022-12-10 DIAGNOSIS — R1030 Lower abdominal pain, unspecified: Secondary | ICD-10-CM | POA: Diagnosis not present

## 2022-12-24 DIAGNOSIS — R1032 Left lower quadrant pain: Secondary | ICD-10-CM | POA: Diagnosis not present

## 2022-12-25 DIAGNOSIS — R1030 Lower abdominal pain, unspecified: Secondary | ICD-10-CM | POA: Diagnosis not present

## 2022-12-31 DIAGNOSIS — E1143 Type 2 diabetes mellitus with diabetic autonomic (poly)neuropathy: Secondary | ICD-10-CM | POA: Diagnosis not present

## 2022-12-31 DIAGNOSIS — I700 Atherosclerosis of aorta: Secondary | ICD-10-CM | POA: Diagnosis not present

## 2022-12-31 DIAGNOSIS — K219 Gastro-esophageal reflux disease without esophagitis: Secondary | ICD-10-CM | POA: Diagnosis not present

## 2022-12-31 DIAGNOSIS — Z0000 Encounter for general adult medical examination without abnormal findings: Secondary | ICD-10-CM | POA: Diagnosis not present

## 2022-12-31 DIAGNOSIS — E7800 Pure hypercholesterolemia, unspecified: Secondary | ICD-10-CM | POA: Diagnosis not present

## 2022-12-31 DIAGNOSIS — E039 Hypothyroidism, unspecified: Secondary | ICD-10-CM | POA: Diagnosis not present

## 2022-12-31 DIAGNOSIS — I10 Essential (primary) hypertension: Secondary | ICD-10-CM | POA: Diagnosis not present

## 2022-12-31 DIAGNOSIS — E782 Mixed hyperlipidemia: Secondary | ICD-10-CM | POA: Diagnosis not present

## 2022-12-31 DIAGNOSIS — E559 Vitamin D deficiency, unspecified: Secondary | ICD-10-CM | POA: Diagnosis not present

## 2023-01-14 DIAGNOSIS — Z23 Encounter for immunization: Secondary | ICD-10-CM | POA: Diagnosis not present

## 2023-01-21 DIAGNOSIS — Z111 Encounter for screening for respiratory tuberculosis: Secondary | ICD-10-CM | POA: Diagnosis not present

## 2023-02-12 DIAGNOSIS — M961 Postlaminectomy syndrome, not elsewhere classified: Secondary | ICD-10-CM | POA: Diagnosis not present

## 2023-02-12 DIAGNOSIS — M545 Low back pain, unspecified: Secondary | ICD-10-CM | POA: Diagnosis not present

## 2023-02-12 DIAGNOSIS — M5416 Radiculopathy, lumbar region: Secondary | ICD-10-CM | POA: Diagnosis not present

## 2023-02-13 NOTE — H&P (Signed)
TOTAL KNEE ADMISSION H&P  Patient is being admitted for right total knee arthroplasty revision.  Subjective:  Chief Complaint: Right knee pain.  HPI: Tina Neal, 72 y.o. female presents for pre-operative visit in preparation for their right femoral vs total knee arthroplasty revision, which is scheduled on 03/05/2023 with Dr. Lequita Halt at [facility]. The patient has had symptoms in the right knee including pain which has impacted their quality of life and ability to do activities of daily living. The patient currently has a diagnosis of loose femoral component, right TKA and has failed conservative treatments including activity modification. The patient has had previous total knee arthroplasty revision on the right knee. The patient denies an active infection.  Patient Active Problem List   Diagnosis Date Noted   S/P lumbar fusion 08/30/2021   Spinal stenosis 08/29/2021   Calcific tendinitis of left shoulder 06/22/2012   Acromioclavicular arthrosis 06/22/2012   Postlaminectomy syndrome, lumbar region 01/28/2012   Disorders of sacrum 01/28/2012   Other chronic postoperative pain 01/28/2012   Pancreatitis 07/13/2011   Hypertension 07/13/2011   COPD (chronic obstructive pulmonary disease) ([facility]) 07/13/2011    Past Medical History:  Diagnosis Date   Arthritis    Asthma    Diabetes mellitus without complication ([facility])    GERD (gastroesophageal reflux disease)    Hyperlipidemia    Hypertension    Hypothyroidism    Pancreatitis    history    Past Surgical History:  Procedure Laterality Date   ABDOMINAL HYSTERECTOMY     ANTERIOR LAT LUMBAR FUSION N/A 08/29/2021   Procedure: EXTREME LATERAL INTERBODY FUSION LUMBAR TWO THROUGH FOUR;  Surgeon: Venita Lick, MD;  Location[facility];  Service: Orthopedics;  Laterality: N/A;  3.5 hrs left tap block with exparel 3 C-Bed   BACK SURGERY  2011   lumbar fusion   CALCANEAL OSTEOTOMY Left 10/28/2013   Procedure: LEFT CALCANEAL  OSTEOTOMY;  Surgeon: Toni Arthurs, MD;  Location: [facility];  Service: Orthopedics;  Laterality: Left;   CHOLECYSTECTOMY  2009   GASTROC RECESSION EXTREMITY Left 10/28/2013   Procedure: LEFT GASTROCNEMIUS RECESSION ;  Surgeon: Toni Arthurs, MD;  Location: [facility];  Service: Orthopedics;  Laterality: Left;   JOINT REPLACEMENT     bilat total knees   KNEE SURGERY     right   rotator cuff     rt   TENDON TRANSFER Left 10/28/2013   Procedure: LEFT FLEXOR DIGITORUM LONGUS TRANSFER, POSTERIOR TIBIAL TENOLYSIS;  Surgeon: Toni Arthurs, MD;  Location: [facility];  Service: Orthopedics;  Laterality: Left;   THYROIDECTOMY      Prior to Admission medications   Medication Sig Start Date End Date Taking? Authorizing Provider  aspirin EC 81 MG tablet Take 81 mg by mouth daily. Swallow whole.    [provider]  atorvastatin (LIPITOR) 10 MG tablet Take 10 mg by mouth every Monday, Wednesday, and Friday. 07/30/21   [provider]  budesonide-formoterol (SYMBICORT) 160-4.5 MCG/ACT inhaler Inhale 2 puffs into the lungs 2 (two) times daily.    [provider]  gabapentin (NEURONTIN) 300 MG capsule Take 300 mg by mouth 3 (three) times daily. 05/10/21   [provider]  levothyroxine (SYNTHROID) 75 MCG tablet Take 75 mcg by mouth daily before breakfast.    [provider]  metFORMIN (GLUCOPHAGE) 500 MG tablet Take 250 mg by mouth daily. 07/16/21   [provider]  montelukast (SINGULAIR) 10 MG tablet Take 10 mg by  mouth daily.     [provider]  ondansetron (ZOFRAN) 4 MG tablet Take 1 tablet (4 mg total) by mouth every 8 (eight) hours as needed for nausea or vomiting. 08/30/21   Venita Lick, MD  pantoprazole (PROTONIX) 40 MG tablet Take 40 mg by mouth daily. 06/09/21   [provider]  spironolactone (ALDACTONE) 25 MG tablet Take 25 mg by mouth 2 (two) times daily.      [provider]   vitamin B-12 (CYANOCOBALAMIN) 1000 MCG tablet Take 1,000 mcg by mouth daily.    [provider]    Allergies  Allergen Reactions   Maxidex [Dexamethasone] Photosensitivity   Hctz [Hydrochlorothiazide] Rash    Social History   Socioeconomic History   Marital status: Married    Spouse name: Not on file   Number of children: Not on file   Years of education: Not on file   Highest education level: Not on file  Occupational History   Not on file  Tobacco Use   Smoking status: Every Day    Current packs/day: 0.50    Types: Cigarettes   Smokeless tobacco: Never   Tobacco comments:    Pt states she has quit as of 1 week ago.  Vaping Use   Vaping status: Never Used  Substance and Sexual Activity   Alcohol use: No    Comment: occasional   Drug use: No   Sexual activity: Yes    Birth control/protection: None  Other Topics Concern   Not on file  Social History Narrative   Not on file   Social Determinants of Health   Financial Resource Strain: Low Risk  (05/31/2022)   Overall Financial Resource Strain (CARDIA)    Difficulty of Paying Living Expenses: Not hard at all  Food Insecurity: No Food Insecurity (05/31/2022)   Hunger Vital Sign    Worried About Running Out of Food in the Last Year: Never true    Ran Out of Food in the Last Year: Never true  Transportation Needs: No Transportation Needs (05/31/2022)   PRAPARE - Administrator, Civil Service (Medical): No    Lack of Transportation (Non-Medical): No  Physical Activity: Not on file  Stress: Not on file  Social Connections: Unknown (12/04/2021)   [facility]   Social Network    Social Network: Not on file  Intimate Partner Violence: Unknown (11/05/2021)   Received from [facility]   HITS    Physically Hurt: Not on file    Insult or Talk Down To: Not on file    Threaten Physical Harm: Not on file    Scream or Curse: Not on file    Tobacco Use: High Risk (05/31/2022)    Patient History    Smoking Tobacco Use: Every Day    Smokeless Tobacco Use: Never    Passive Exposure: Not on file   Social History   Substance and Sexual Activity  Alcohol Use No   Comment: occasional    Family History  Problem Relation Age of Onset   Asthma Mother    Cancer Father    Sleep apnea Other    Diabetes Other    Hypertension Other     Review of Systems  Constitutional:  Negative for chills and fever.  HENT:  Negative for congestion, sore throat and tinnitus.   Eyes:  Negative for double vision, photophobia and pain.  Respiratory:  Negative for cough, shortness of breath and wheezing.   Cardiovascular:  Negative for chest pain, palpitations and orthopnea.  Gastrointestinal:  Negative for heartburn, nausea and vomiting.  Genitourinary:  Negative for dysuria, frequency and urgency.  Musculoskeletal:  Positive for joint pain.  Neurological:  Negative for dizziness, weakness and headaches.    Objective:  Physical Exam: Well nourished and well developed.  General: Alert and oriented x3, cooperative and pleasant, no acute distress.  Head: normocephalic, atraumatic, neck supple.  Eyes: EOMI.  Musculoskeletal:  Right Knee Exam: No warmth or effusion present. No swelling present. The range of motion is: 0 to 110 degrees. No crepitus on range of motion of the knee. Positive tenderness just superior to the joint line. The knee is stable.  Calves soft and nontender. Motor function intact in LE. Strength 5/5 LE bilaterally. Neuro: Distal pulses 2+. Sensation to light touch intact in LE.  Imaging Review Radiographs - bilateral AP and lateral of the right knee dated 09/13/2022 demonstrate a primary prosthesis on the left in excellent position with no periprosthetic abnormalities. On the right, she has a revision with large augments. The tibial side looks perfect and is well fixed. On the femoral side, the prosthesis is loose.  Assessment/Plan:  Failed right total  knee arthroplasty  The risks and benefits of total knee arthroplasty revision were presented and reviewed. The risks due to aseptic loosening, infection, stiffness, patella tracking problems, thromboembolic complications and other imponderables were discussed. The patient acknowledged the explanation, agreed to proceed with the plan and consent was signed. Patient is being admitted for inpatient treatment for surgery, pain control, PT, OT, prophylactic antibiotics, VTE prophylaxis, progressive ambulation and ADLs and discharge planning.   Therapy Plans: Outpatient therapy at [facility] Disposition: Home with husband Planned DVT Prophylaxis: Aspirin 81 mg BID DME Needed: None PCP: Eleanora Neighbor, MD (clearance received) TXA: IV Allergies: NKDA Anesthesia Concerns: None BMI: 35 Last HgbA1c: 6.5% (12/31/22) Pain Regimen: Oxycodone Pharmacy: [facility])  Other: - Ozempic on Sundays, will hold on 7/29   - Patient was instructed on what medications to stop prior to surgery. - Follow-up visit in 2 weeks with Dr. Lequita Halt - Begin physical therapy following surgery - Pre-operative lab work as pre-surgical testing - Prescriptions will be provided in hospital at time of discharge  Arther Abbott, PA-C Orthopedic Surgery [facility] [place]

## 2023-02-25 NOTE — Progress Notes (Signed)
COVID Vaccine received:  []  No [x]  Yes Date of any COVID positive Test in last 90 days:  PCP - Eleanora Neighbor, MD   Bennye Alm clearance on chart Cardiologist -   Chest x-ray - 08-02-2020  2v  Epic EKG -  (08-27-2021)   will repeat at PST Stress Test -  ECHO -  Cardiac Cath -   PCR screen: [x]  Ordered & Completed           []   No Order but Needs PROFEND           []   N/A for this surgery  Surgery Plan:  []  Ambulatory                            []  Outpatient in bed                            [x]  Admit  Anesthesia:    []  General  []  Spinal                           [x]   Choice []   MAC  Pacemaker / ICD device [x]  No []  Yes   Spinal Cord Stimulator:[x]  No []  Yes       History of Sleep Apnea? [x]  No []  Yes   CPAP used?- [x]  No []  Yes    Does the patient monitor blood sugar?          []  No []  Yes  []  N/A  Patient has: []  NO Hx DM   []  Pre-DM                 []  DM1  [x]   DM2 Does patient have a [employer] or Dexacom? []  No []  Yes   Fasting Blood Sugar Ranges-  Checks Blood Sugar _____ times a day  GLP1 agonist / usual dose - Ozempic  injection on Sunday  GLP1 instructions: Hold injection for Sunday [id number] Diabetic medications/ instructions: Metformin 250 mg daily  Blood Thinner / Instructions:none Aspirin Instructions:  none  ERAS Protocol Ordered: []  No  [x]  Yes PRE-SURGERY []  ENSURE  [x]  G2  Patient is to be NPO after: 04:30  Comments: Patient was given the 5 CHG shower / bath instructions for TKA surgery along with 2 bottles of the CHG soap. Patient will start this on:  Saturday 03-01-2023.  All questions were asked and answered, Patient voiced understanding of this process.   Activity level: Patient is able / unable to climb a flight of stairs without difficulty; []  No CP  []  No SOB, but would have ___   Patient can / can not perform ADLs without assistance.   Anesthesia review: DM2, smoker, GERD, asthma / COPD, HTN, Hx pancreatitis  Patient denies  shortness of breath, fever, cough and chest pain at PAT appointment.  Patient verbalized understanding and agreement to the Pre-Surgical Instructions that were given to them at this PAT appointment. Patient was also educated of the need to review these PAT instructions again prior to her surgery.I reviewed the appropriate phone numbers to call if they have any and questions or concerns.

## 2023-02-25 NOTE — Patient Instructions (Signed)
SURGICAL WAITING ROOM VISITATION Patients having surgery or a procedure may have no more than 2 support people in the waiting area - these visitors may rotate in the visitor waiting room.   Due to an increase in RSV and influenza rates and associated hospitalizations, children ages 62 and under may not visit patients in [facility] hospitals. If the patient needs to stay at the hospital during part of their recovery, the visitor guidelines for inpatient rooms apply.  PRE-OP VISITATION  Pre-op nurse will coordinate an appropriate time for 1 support person to accompany the patient in pre-op.  This support person may not rotate.  This visitor will be contacted when the time is appropriate for the visitor to come back in the pre-op area.  Please refer to the [facility] website for the visitor guidelines for Inpatients (after your surgery is over and you are in a regular room).  You are not required to quarantine at this time prior to your surgery. However, you must do this: Hand Hygiene often Do NOT share personal items Notify your provider if you are in close contact with someone who has COVID or you develop fever 100.4 or greater, new onset of sneezing, cough, sore throat, shortness of breath or body aches.  If you test positive for Covid or have been in contact with anyone that has tested positive in the last 10 days please notify you surgeon.    Your procedure is scheduled on:  Wednesday March 05, 2023  Report to [facility] Main Entrance: [facility] entrance where the [employer] is available.   Report to admitting at:  05;15   AM  Call this number if you have any questions or problems the morning of surgery [phone]  Do not eat food after Midnight the night prior to your surgery/procedure.  After Midnight you may have the following liquids until   04:30 AM DAY OF SURGERY  Clear Liquid Diet Water Black Coffee (sugar ok, NO MILK/CREAM OR CREAMERS)  Tea (sugar ok, NO  MILK/CREAM OR CREAMERS) regular and decaf                             Plain Jell-O  with no fruit (NO RED)                                           Fruit ices (not with fruit pulp, NO RED)                                     Popsicles (NO RED)                                                                  Juice: NO CITRUS JUICES: only apple, WHITE grape, WHITE cranberry Sports drinks like Gatorade or Powerade (NO RED)                     The day of surgery:  Drink ONE (1) Pre-Surgery G2 at  04:30 AM the morning of  surgery. Drink in one sitting. Do not sip.  This drink was given to you during your hospital pre-op appointment visit. Nothing else to drink after completing the Pre-Surgery G2 : No candy, chewing gum or throat lozenges.    FOLLOW  ANY ADDITIONAL PRE OP INSTRUCTIONS YOU RECEIVED FROM YOUR SURGEON'S OFFICE!!!   Oral Hygiene is also important to reduce your risk of infection.        Remember - BRUSH YOUR TEETH THE MORNING OF SURGERY WITH YOUR REGULAR TOOTHPASTE  Do NOT smoke after Midnight the night before surgery.  Take ONLY these medicines the morning of surgery with A SIP OF WATER: pantoprazole (Protonix), levothyroxine (Synthroid), Montelukast.  You may use your Symbicort Inhaler if needed. You may take Tramadol for pain.                    You may not have any metal on your body including hair pins, jewelry, and body piercing  Do not wear make-up, lotions, powders, perfumes or deodorant  Do not wear nail polish including gel and S&S, artificial / acrylic nails, or any other type of covering on natural nails including finger and toenails. If you have artificial nails, gel coating, etc., that needs to be removed by a nail salon, Please have this removed prior to surgery. Not doing so may mean that your surgery could be cancelled or delayed if the Surgeon or anesthesia staff feels like they are unable to monitor you safely.   Do not shave 48 hours prior to surgery to  avoid nicks in your skin which may contribute to postoperative infections.   Contacts, Hearing Aids, dentures or bridgework may not be worn into surgery. DENTURES WILL BE REMOVED PRIOR TO SURGERY PLEASE DO NOT APPLY "Poly grip" OR ADHESIVES!!!  You may bring a small overnight bag with you on the day of surgery, only pack items that are not valuable. [facility] IS NOT RESPONSIBLE   FOR VALUABLES THAT ARE LOST OR STOLEN.   Do not bring your home medications to the hospital. The Pharmacy will dispense medications listed on your medication list to you during your admission in the Hospital.  Special Instructions: Bring a copy of your healthcare power of attorney and living will documents the day of surgery, if you wish to have them scanned into your [facility]- EPIC  Please read over the following fact sheets you were given: IF YOU HAVE QUESTIONS ABOUT YOUR PRE-OP INSTRUCTIONS, PLEASE CALL [phone].         Pre-operative 5 CHG Bath Instructions   You can play a key role in reducing the risk of infection after surgery. Your skin needs to be as free of germs as possible. You can reduce the number of germs on your skin by washing with CHG (chlorhexidine gluconate) soap before surgery. CHG is an antiseptic soap that kills germs and continues to kill germs even after washing.   DO NOT use if you have an allergy to chlorhexidine/CHG or antibacterial soaps. If your skin becomes reddened or irritated, stop using the CHG and notify one of our RNs at [phone]  Please shower with the CHG soap starting 4 days before surgery using the following schedule: START SHOWERS ON SATURDAY  March 01, 2023  Please keep in mind the following:  DO NOT shave, including legs and underarms, starting the day of your  first shower.   You may shave your face at any point before/day of surgery.   Place clean sheets on your bed the day you start using CHG soap. Use a clean washcloth (not used since being washed) for each shower. DO NOT sleep with pets once you start using the CHG.   CHG Shower Instructions:  If you choose to wash your hair and private area, wash first with your normal shampoo/soap.  After you use shampoo/soap, rinse your hair and body thoroughly to remove shampoo/soap residue.  Turn the water OFF and apply about 3 tablespoons (45 ml) of CHG soap to a CLEAN washcloth.  Apply CHG soap ONLY FROM YOUR NECK DOWN TO YOUR TOES (washing for 3-5 minutes)  DO NOT use CHG soap on face, private areas, open wounds, or sores.  Pay special attention to the area where your surgery is being performed.  If you are having back surgery, having someone wash your back for you may be helpful.  Wait 2 minutes after CHG soap is applied, then you may rinse off the CHG soap.  Pat dry with a clean towel  Put on clean clothes/pajamas   If you choose to wear lotion, please use ONLY the CHG-compatible lotions on the back of this paper.     Additional instructions for the day of surgery: DO NOT APPLY any lotions, deodorants, cologne, or perfumes.   Put on clean/comfortable clothes.  Brush your teeth.  Ask your nurse before applying any prescription medications to the skin.      CHG Compatible Lotions   Aveeno Moisturizing lotion  Cetaphil Moisturizing Cream  Cetaphil Moisturizing Lotion  Clairol Herbal Essence Moisturizing Lotion, Dry Skin  Clairol Herbal Essence Moisturizing Lotion, Extra Dry Skin  Clairol Herbal Essence Moisturizing Lotion, Normal Skin  Curel Age Defying Therapeutic Moisturizing Lotion with Alpha Hydroxy  Curel Extreme Care Body Lotion  Curel Soothing Hands Moisturizing Hand Lotion  Curel Therapeutic Moisturizing Cream, Fragrance-Free  Curel Therapeutic Moisturizing Lotion,  Fragrance-Free  Curel Therapeutic Moisturizing Lotion, Original Formula  Eucerin Daily Replenishing Lotion  Eucerin Dry Skin Therapy Plus Alpha Hydroxy Crme  Eucerin Dry Skin Therapy Plus Alpha Hydroxy Lotion  Eucerin Original Crme  Eucerin Original Lotion  Eucerin Plus Crme Eucerin Plus Lotion  Eucerin TriLipid Replenishing Lotion  Keri Anti-Bacterial Hand Lotion  Keri Deep Conditioning Original Lotion Dry Skin Formula Softly Scented  Keri Deep Conditioning Original Lotion, Fragrance Free Sensitive Skin Formula  Keri Lotion Fast Absorbing Fragrance Free Sensitive Skin Formula  Keri Lotion Fast Absorbing Softly Scented Dry Skin Formula  Keri Original Lotion  Keri Skin Renewal Lotion Keri Silky Smooth Lotion  Keri Silky Smooth Sensitive Skin Lotion  Nivea Body Creamy Conditioning Oil  Nivea Body Extra Enriched Lotion  Nivea Body Original Lotion  Nivea Body Sheer Moisturizing Lotion Nivea Crme  Nivea Skin Firming Lotion  NutraDerm 30 Skin Lotion  NutraDerm Skin Lotion  NutraDerm Therapeutic Skin Cream  NutraDerm Therapeutic Skin Lotion  ProShield Protective Hand Cream  Provon moisturizing lotion   FAILURE TO FOLLOW THESE INSTRUCTIONS MAY RESULT IN THE CANCELLATION OF YOUR SURGERY  PATIENT SIGNATURE_________________________________  NURSE SIGNATURE__________________________________  ________________________________________________________________________      Tina Neal    An incentive spirometer is a tool that can help keep your lungs clear and active. This tool measures how well you are filling your lungs with each breath. Taking long  deep breaths may help reverse or decrease the chance of developing breathing (pulmonary) problems (especially infection) following: A long period of time when you are unable to move or be active. BEFORE THE PROCEDURE  If the spirometer includes an indicator to show your best effort, your nurse or respiratory therapist will  set it to a desired goal. If possible, sit up straight or lean slightly forward. Try not to slouch. Hold the incentive spirometer in an upright position. INSTRUCTIONS FOR USE  Sit on the edge of your bed if possible, or sit up as far as you can in bed or on a chair. Hold the incentive spirometer in an upright position. Breathe out normally. Place the mouthpiece in your mouth and seal your lips tightly around it. Breathe in slowly and as deeply as possible, raising the piston or the ball toward the top of the column. Hold your breath for 3-5 seconds or for as long as possible. Allow the piston or ball to fall to the bottom of the column. Remove the mouthpiece from your mouth and breathe out normally. Rest for a few seconds and repeat Steps 1 through 7 at least 10 times every 1-2 hours when you are awake. Take your time and take a few normal breaths between deep breaths. The spirometer may include an indicator to show your best effort. Use the indicator as a goal to work toward during each repetition. After each set of 10 deep breaths, practice coughing to be sure your lungs are clear. If you have an incision (the cut made at the time of surgery), support your incision when coughing by placing a pillow or rolled up towels firmly against it. Once you are able to get out of bed, walk around indoors and cough well. You may stop using the incentive spirometer when instructed by your caregiver.  RISKS AND COMPLICATIONS Take your time so you do not get dizzy or light-headed. If you are in pain, you may need to take or ask for pain medication before doing incentive spirometry. It is harder to take a deep breath if you are having pain. AFTER USE Rest and breathe slowly and easily. It can be helpful to keep track of a log of your progress. Your caregiver can provide you with a simple table to help with this. If you are using the spirometer at home, follow these instructions: SEEK MEDICAL CARE IF:  You  are having difficultly using the spirometer. You have trouble using the spirometer as often as instructed. Your pain medication is not giving enough relief while using the spirometer. You develop fever of 100.5 F (38.1 C) or higher.                                                                                                    SEEK IMMEDIATE MEDICAL CARE IF:  You cough up bloody sputum that had not been present before. You develop fever of 102 F (38.9 C) or greater. You develop worsening pain at or near the incision site. MAKE SURE YOU:  Understand these instructions. Will watch your condition. Will  get help right away if you are not doing well or get worse. Document Released: 12/02/2006 Document Revised: 10/14/2011 Document Reviewed: 02/02/2007 [facility] Patient Information 2014 [place], [place].      WHAT IS A BLOOD TRANSFUSION? Blood Transfusion Information  A transfusion is the replacement of blood or some of its parts. Blood is made up of multiple cells which provide different functions. Red blood cells carry oxygen and are used for blood loss replacement. White blood cells fight against infection. Platelets control bleeding. Plasma helps clot blood. Other blood products are available for specialized needs, such as hemophilia or other clotting disorders. BEFORE THE TRANSFUSION  Who gives blood for transfusions?  Healthy volunteers who are fully evaluated to make sure their blood is safe. This is blood bank blood. Transfusion therapy is the safest it has ever been in the practice of medicine. Before blood is taken from a donor, a complete history is taken to make sure that person has no history of diseases nor engages in risky social behavior (examples are intravenous drug use or sexual activity with multiple partners). The donor's travel history is screened to minimize risk of transmitting infections, such as malaria. The donated blood is tested for signs of infectious  diseases, such as HIV and hepatitis. The blood is then tested to be sure it is compatible with you in order to minimize the chance of a transfusion reaction. If you or a relative donates blood, this is often done in anticipation of surgery and is not appropriate for emergency situations. It takes many days to process the donated blood. RISKS AND COMPLICATIONS Although transfusion therapy is very safe and saves many lives, the main dangers of transfusion include:  Getting an infectious disease. Developing a transfusion reaction. This is an allergic reaction to something in the blood you were given. Every precaution is taken to prevent this. The decision to have a blood transfusion has been considered carefully by your caregiver before blood is given. Blood is not given unless the benefits outweigh the risks. AFTER THE TRANSFUSION Right after receiving a blood transfusion, you will usually feel much better and more energetic. This is especially true if your red blood cells have gotten low (anemic). The transfusion raises the level of the red blood cells which carry oxygen, and this usually causes an energy increase. The nurse administering the transfusion will monitor you carefully for complications. HOME CARE INSTRUCTIONS  No special instructions are needed after a transfusion. You may find your energy is better. Speak with your caregiver about any limitations on activity for underlying diseases you may have. SEEK MEDICAL CARE IF:  Your condition is not improving after your transfusion. You develop redness or irritation at the intravenous (IV) site. SEEK IMMEDIATE MEDICAL CARE IF:  Any of the following symptoms occur over the next 12 hours: Shaking chills. You have a temperature by mouth above 102 F (38.9 C), not controlled by medicine. Chest, back, or muscle pain. People around you feel you are not acting correctly or are confused. Shortness of breath or difficulty breathing. Dizziness and  fainting. You get a rash or develop hives. You have a decrease in urine output. Your urine turns a dark color or changes to pink, red, or brown. Any of the following symptoms occur over the next 10 days: You have a temperature by mouth above 102 F (38.9 C), not controlled by medicine. Shortness of breath. Weakness after normal activity. The white part of the eye turns yellow (jaundice). You have a decrease  in the amount of urine or are urinating less often. Your urine turns a dark color or changes to pink, red, or brown. Document Released: 07/19/2000 Document Revised: 10/14/2011 Document Reviewed: 03/07/2008 [facility] Patient Information 2014 [place], [place].          _______________________________________________________________________

## 2023-02-27 ENCOUNTER — Encounter (HOSPITAL_COMMUNITY)
Admission: RE | Admit: 2023-02-27 | Discharge: 2023-02-27 | Disposition: A | Discharge status: Home / Self Care | Payer: Medicare HMO | Source: Ambulatory Visit | Attending: Orthopedic Surgery | Admitting: Orthopedic Surgery

## 2023-02-27 ENCOUNTER — Other Ambulatory Visit: Payer: Self-pay

## 2023-02-27 ENCOUNTER — Encounter (HOSPITAL_COMMUNITY): Payer: Self-pay

## 2023-02-27 VITALS — BP 118/80 | HR 82 | Temp 98.1°F | Resp 20 | Ht 62.5 in | Wt 185.0 lb

## 2023-02-27 DIAGNOSIS — E119 Type 2 diabetes mellitus without complications: Secondary | ICD-10-CM | POA: Insufficient documentation

## 2023-02-27 DIAGNOSIS — Z01818 Encounter for other preprocedural examination: Secondary | ICD-10-CM | POA: Insufficient documentation

## 2023-02-27 DIAGNOSIS — K859 Acute pancreatitis without necrosis or infection, unspecified: Secondary | ICD-10-CM | POA: Diagnosis not present

## 2023-02-27 DIAGNOSIS — R9431 Abnormal electrocardiogram [ECG] [EKG]: Secondary | ICD-10-CM | POA: Diagnosis not present

## 2023-02-27 DIAGNOSIS — I10 Essential (primary) hypertension: Secondary | ICD-10-CM | POA: Diagnosis not present

## 2023-02-27 LAB — SURGICAL PCR SCREEN
MRSA, PCR: NEGATIVE
Staphylococcus aureus: NEGATIVE

## 2023-02-27 LAB — CBC
HCT: 38.6 % (ref 36.0–46.0)
Hemoglobin: 12 g/dL (ref 12.0–15.0)
MCH: 26.2 pg (ref 26.0–34.0)
MCHC: 31.1 g/dL (ref 30.0–36.0)
MCV: 84.3 fL (ref 80.0–100.0)
Platelets: 170 10*3/uL (ref 150–400)
RBC: 4.58 MIL/uL (ref 3.87–5.11)
RDW: 15 % (ref 11.5–15.5)
WBC: 6.1 10*3/uL (ref 4.0–10.5)
nRBC: 0 % (ref 0.0–0.2)

## 2023-02-27 LAB — COMPREHENSIVE METABOLIC PANEL
ALT: 12 U/L (ref 0–44)
AST: 17 U/L (ref 15–41)
Albumin: 3.9 g/dL (ref 3.5–5.0)
Alkaline Phosphatase: 117 U/L (ref 38–126)
Anion gap: 10 (ref 5–15)
BUN: 10 mg/dL (ref 8–23)
CO2: 27 mmol/L (ref 22–32)
Calcium: 9.1 mg/dL (ref 8.9–10.3)
Chloride: 101 mmol/L (ref 98–111)
Creatinine, Ser: 0.75 mg/dL (ref 0.44–1.00)
GFR, Estimated: >60 mL/min (ref >60)
Glucose, Bld: 99 mg/dL (ref 70–99)
Potassium: 4.1 mmol/L (ref 3.5–5.1)
Sodium: 138 mmol/L (ref 135–145)
Total Bilirubin: 0.5 mg/dL (ref 0.3–1.2)
Total Protein: 7.9 g/dL (ref 6.5–8.1)

## 2023-02-27 LAB — HEMOGLOBIN A1C
Hgb A1c MFr Bld: 6.1 % — ABNORMAL HIGH (ref 4.8–5.6)
Mean Plasma Glucose: 128.37 mg/dL

## 2023-02-27 LAB — TYPE AND SCREEN
ABO/RH(D): A POS
Antibody Screen: NEGATIVE

## 2023-02-27 LAB — GLUCOSE, CAPILLARY: Glucose-Capillary: 111 mg/dL — ABNORMAL HIGH (ref 70–99)

## 2023-03-04 NOTE — Anesthesia Preprocedure Evaluation (Signed)
Anesthesia Evaluation  Patient identified by MRN, date of birth, ID band Patient awake    Reviewed: Allergy & Precautions, H&P , NPO status , Patient's Chart, lab work & pertinent test results  History of Anesthesia Complications Negative for: history of anesthetic complications  Airway Mallampati: I  TM Distance: >3 FB Neck ROM: Full    Dental  (+) Edentulous Upper, Edentulous Lower,    Pulmonary neg pulmonary ROS, asthma , COPD, Patient abstained from smoking., former smoker   Pulmonary exam normal        Cardiovascular hypertension, Pt. on medications negative cardio ROS Normal cardiovascular exam     Neuro/Psych negative neurological ROS  negative psych ROS   GI/Hepatic negative GI ROS, Neg liver ROS,GERD  ,,  Endo/Other  negative endocrine ROSdiabetesHypothyroidism    Renal/GU negative Renal ROS  negative genitourinary   Musculoskeletal negative musculoskeletal ROS (+) Arthritis ,    Abdominal   Peds negative pediatric ROS (+)  Hematology negative hematology ROS (+)   Anesthesia Other Findings   Reproductive/Obstetrics negative OB ROS                             Anesthesia Physical Anesthesia Plan  ASA: 3  Anesthesia Plan: General   Post-op Pain Management: Tylenol PO (pre-op) and Regional block*   Induction: Intravenous  PONV Risk Score and Plan: 3 and Ondansetron and TIVA  Airway Management Planned: Oral ETT and LMA  Additional Equipment: None  Intra-op Plan:   Post-operative Plan: Extubation in OR  Informed Consent: I have reviewed the patients History and Physical, chart, labs and discussed the procedure including the risks, benefits and alternatives for the proposed anesthesia with the patient or authorized representative who has indicated his/her understanding and acceptance.     Dental advisory given  Plan Discussed with: Anesthesiologist and  CRNA  Anesthesia Plan Comments:         Anesthesia Quick Evaluation

## 2023-03-05 ENCOUNTER — Encounter (HOSPITAL_COMMUNITY): Payer: Self-pay | Admitting: Orthopedic Surgery

## 2023-03-05 ENCOUNTER — Other Ambulatory Visit: Payer: Self-pay

## 2023-03-05 ENCOUNTER — Inpatient Hospital Stay (HOSPITAL_COMMUNITY)
Admission: RE | Admit: 2023-03-05 | Discharge: 2023-03-07 | DRG: 468 | Disposition: A | Discharge status: Home / Self Care | Payer: Medicare HMO | Attending: Orthopedic Surgery | Admitting: Orthopedic Surgery

## 2023-03-05 ENCOUNTER — Encounter (HOSPITAL_COMMUNITY)
Admission: RE | Disposition: A | Discharge status: Home / Self Care | Payer: Self-pay | Source: Home / Self Care | Attending: Orthopedic Surgery

## 2023-03-05 ENCOUNTER — Inpatient Hospital Stay (HOSPITAL_COMMUNITY): Payer: Medicare HMO | Admitting: Anesthesiology

## 2023-03-05 DIAGNOSIS — Z90710 Acquired absence of both cervix and uterus: Secondary | ICD-10-CM | POA: Diagnosis not present

## 2023-03-05 DIAGNOSIS — I10 Essential (primary) hypertension: Secondary | ICD-10-CM | POA: Diagnosis not present

## 2023-03-05 DIAGNOSIS — T84092A Other mechanical complication of internal right knee prosthesis, initial encounter: Principal | ICD-10-CM | POA: Diagnosis present

## 2023-03-05 DIAGNOSIS — Z7982 Long term (current) use of aspirin: Secondary | ICD-10-CM

## 2023-03-05 DIAGNOSIS — J4489 Other specified chronic obstructive pulmonary disease: Secondary | ICD-10-CM | POA: Diagnosis not present

## 2023-03-05 DIAGNOSIS — Z96653 Presence of artificial knee joint, bilateral: Secondary | ICD-10-CM | POA: Diagnosis not present

## 2023-03-05 DIAGNOSIS — Z7984 Long term (current) use of oral hypoglycemic drugs: Secondary | ICD-10-CM

## 2023-03-05 DIAGNOSIS — Z8249 Family history of ischemic heart disease and other diseases of the circulatory system: Secondary | ICD-10-CM

## 2023-03-05 DIAGNOSIS — Y792 Prosthetic and other implants, materials and accessory orthopedic devices associated with adverse incidents: Secondary | ICD-10-CM | POA: Diagnosis present

## 2023-03-05 DIAGNOSIS — Z981 Arthrodesis status: Secondary | ICD-10-CM | POA: Diagnosis not present

## 2023-03-05 DIAGNOSIS — E890 Postprocedural hypothyroidism: Secondary | ICD-10-CM | POA: Diagnosis present

## 2023-03-05 DIAGNOSIS — J449 Chronic obstructive pulmonary disease, unspecified: Secondary | ICD-10-CM | POA: Diagnosis not present

## 2023-03-05 DIAGNOSIS — E785 Hyperlipidemia, unspecified: Secondary | ICD-10-CM | POA: Diagnosis not present

## 2023-03-05 DIAGNOSIS — Z7951 Long term (current) use of inhaled steroids: Secondary | ICD-10-CM

## 2023-03-05 DIAGNOSIS — Z9049 Acquired absence of other specified parts of digestive tract: Secondary | ICD-10-CM

## 2023-03-05 DIAGNOSIS — T84018A Broken internal joint prosthesis, other site, initial encounter: Principal | ICD-10-CM

## 2023-03-05 DIAGNOSIS — Z87891 Personal history of nicotine dependence: Secondary | ICD-10-CM

## 2023-03-05 DIAGNOSIS — Z96651 Presence of right artificial knee joint: Secondary | ICD-10-CM | POA: Diagnosis not present

## 2023-03-05 DIAGNOSIS — E119 Type 2 diabetes mellitus without complications: Secondary | ICD-10-CM | POA: Diagnosis not present

## 2023-03-05 DIAGNOSIS — Z888 Allergy status to other drugs, medicaments and biological substances status: Secondary | ICD-10-CM | POA: Diagnosis not present

## 2023-03-05 DIAGNOSIS — G8918 Other acute postprocedural pain: Secondary | ICD-10-CM | POA: Diagnosis not present

## 2023-03-05 DIAGNOSIS — Z79890 Hormone replacement therapy (postmenopausal): Secondary | ICD-10-CM

## 2023-03-05 DIAGNOSIS — F17210 Nicotine dependence, cigarettes, uncomplicated: Secondary | ICD-10-CM | POA: Diagnosis present

## 2023-03-05 DIAGNOSIS — K219 Gastro-esophageal reflux disease without esophagitis: Secondary | ICD-10-CM | POA: Diagnosis not present

## 2023-03-05 DIAGNOSIS — E039 Hypothyroidism, unspecified: Secondary | ICD-10-CM | POA: Diagnosis not present

## 2023-03-05 DIAGNOSIS — T84012A Broken internal right knee prosthesis, initial encounter: Secondary | ICD-10-CM | POA: Diagnosis present

## 2023-03-05 DIAGNOSIS — Z01818 Encounter for other preprocedural examination: Secondary | ICD-10-CM

## 2023-03-05 DIAGNOSIS — Z79899 Other long term (current) drug therapy: Secondary | ICD-10-CM | POA: Diagnosis not present

## 2023-03-05 DIAGNOSIS — T84032A Mechanical loosening of internal right knee prosthetic joint, initial encounter: Secondary | ICD-10-CM | POA: Diagnosis not present

## 2023-03-05 HISTORY — PX: TOTAL KNEE REVISION: SHX996

## 2023-03-05 LAB — GLUCOSE, CAPILLARY
Glucose-Capillary: 97 mg/dL (ref 70–99)
Glucose-Capillary: 101 mg/dL — ABNORMAL HIGH (ref 70–99)
Glucose-Capillary: 133 mg/dL — ABNORMAL HIGH (ref 70–99)
Glucose-Capillary: 133 mg/dL — ABNORMAL HIGH (ref 70–99)

## 2023-03-05 LAB — AEROBIC/ANAEROBIC CULTURE W GRAM STAIN (SURGICAL/DEEP WOUND): Gram Stain: NONE SEEN

## 2023-03-05 LAB — TYPE AND SCREEN
ABO/RH(D): A POS
Antibody Screen: NEGATIVE

## 2023-03-05 SURGERY — TOTAL KNEE REVISION
Anesthesia: General | Site: Knee | Laterality: Right

## 2023-03-05 MED ORDER — METHOCARBAMOL 500 MG IVPB - SIMPLE MED
500.0000 mg | Freq: Four times a day (QID) | INTRAVENOUS | Status: DC | PRN
  Administered 2023-03-05: 500 mg via INTRAVENOUS

## 2023-03-05 MED ORDER — DEXMEDETOMIDINE HCL IN NACL 80 MCG/20ML IV SOLN
INTRAVENOUS | Status: AC
  Filled 2023-03-05: qty 20

## 2023-03-05 MED ORDER — ORAL CARE MOUTH RINSE: 15.0000 mL | Freq: Once | OROMUCOSAL | Status: AC

## 2023-03-05 MED ORDER — POLYETHYLENE GLYCOL 3350 17 G PO PACK
17.0000 g | PACK | Freq: Every day | ORAL | Status: DC | PRN
  Administered 2023-03-06: 17 g via ORAL
  Filled 2023-03-05: qty 1

## 2023-03-05 MED ORDER — LABETALOL HCL 5 MG/ML IV SOLN
INTRAVENOUS | Status: DC | PRN
  Administered 2023-03-05: 2.5 mg via INTRAVENOUS
  Administered 2023-03-05: 5 mg via INTRAVENOUS

## 2023-03-05 MED ORDER — TRAMADOL HCL 50 MG PO TABS
50.0000 mg | ORAL_TABLET | Freq: Four times a day (QID) | ORAL | Status: DC | PRN
  Administered 2023-03-06 – 2023-03-07 (×3): 100 mg via ORAL
  Filled 2023-03-05 (×4): qty 2

## 2023-03-05 MED ORDER — OXYCODONE HCL 5 MG/5ML PO SOLN: 5.0000 mg | Freq: Once | ORAL | Status: AC | PRN

## 2023-03-05 MED ORDER — METHOCARBAMOL 500 MG PO TABS
500.0000 mg | ORAL_TABLET | Freq: Four times a day (QID) | ORAL | Status: DC | PRN
  Administered 2023-03-05 – 2023-03-07 (×4): 500 mg via ORAL
  Filled 2023-03-05 (×4): qty 1

## 2023-03-05 MED ORDER — BISACODYL 10 MG RE SUPP: 10.0000 mg | Freq: Every day | RECTAL | Status: DC | PRN

## 2023-03-05 MED ORDER — LACTATED RINGERS IV SOLN: INTRAVENOUS | Status: DC

## 2023-03-05 MED ORDER — CEFAZOLIN SODIUM-DEXTROSE 2-4 GM/100ML-% IV SOLN
2.0000 g | INTRAVENOUS | Status: AC
  Administered 2023-03-05: 2 g via INTRAVENOUS
  Filled 2023-03-05: qty 100

## 2023-03-05 MED ORDER — TRANEXAMIC ACID-NACL 1000-0.7 MG/100ML-% IV SOLN
1000.0000 mg | INTRAVENOUS | Status: AC
  Administered 2023-03-05: 1000 mg via INTRAVENOUS
  Filled 2023-03-05: qty 100

## 2023-03-05 MED ORDER — INSULIN ASPART 100 UNIT/ML IJ SOLN
0.0000 [IU] | Freq: Three times a day (TID) | INTRAMUSCULAR | Status: DC
  Administered 2023-03-07: 2 [IU] via SUBCUTANEOUS

## 2023-03-05 MED ORDER — DOCUSATE SODIUM 100 MG PO CAPS
100.0000 mg | ORAL_CAPSULE | Freq: Two times a day (BID) | ORAL | Status: DC
  Administered 2023-03-05 – 2023-03-07 (×4): 100 mg via ORAL
  Filled 2023-03-05 (×4): qty 1

## 2023-03-05 MED ORDER — DEXMEDETOMIDINE HCL IN NACL 80 MCG/20ML IV SOLN
INTRAVENOUS | Status: DC | PRN
  Administered 2023-03-05: 8 ug via INTRAVENOUS

## 2023-03-05 MED ORDER — MIDAZOLAM HCL 2 MG/2ML IJ SOLN: 2.0000 mg | Freq: Once | INTRAMUSCULAR | Status: DC

## 2023-03-05 MED ORDER — SODIUM CHLORIDE (PF) 0.9 % IJ SOLN
INTRAMUSCULAR | Status: AC
  Filled 2023-03-05: qty 10

## 2023-03-05 MED ORDER — LIDOCAINE HCL (PF) 2 % IJ SOLN
INTRAMUSCULAR | Status: AC
  Filled 2023-03-05: qty 5

## 2023-03-05 MED ORDER — SPIRONOLACTONE 25 MG PO TABS
25.0000 mg | ORAL_TABLET | Freq: Two times a day (BID) | ORAL | Status: DC
  Administered 2023-03-06 – 2023-03-07 (×3): 25 mg via ORAL
  Filled 2023-03-05 (×3): qty 1

## 2023-03-05 MED ORDER — OXYCODONE HCL 5 MG PO TABS
ORAL_TABLET | ORAL | Status: AC
  Filled 2023-03-05: qty 1

## 2023-03-05 MED ORDER — SODIUM CHLORIDE 0.9 % IR SOLN
Status: DC | PRN
  Administered 2023-03-05: 1000 mL

## 2023-03-05 MED ORDER — ACETAMINOPHEN 160 MG/5ML PO SOLN: 325.0000 mg | ORAL | Status: DC | PRN

## 2023-03-05 MED ORDER — MENTHOL 3 MG MT LOZG
1.0000 | LOZENGE | OROMUCOSAL | Status: DC | PRN
  Administered 2023-03-05: 3 mg via ORAL
  Filled 2023-03-05: qty 9

## 2023-03-05 MED ORDER — MONTELUKAST SODIUM 10 MG PO TABS
10.0000 mg | ORAL_TABLET | Freq: Every day | ORAL | Status: DC
  Administered 2023-03-05 – 2023-03-07 (×3): 10 mg via ORAL
  Filled 2023-03-05 (×3): qty 1

## 2023-03-05 MED ORDER — PROPOFOL 10 MG/ML IV BOLUS
INTRAVENOUS | Status: DC | PRN
  Administered 2023-03-05: 150 mg via INTRAVENOUS
  Administered 2023-03-05 (×2): 50 mg via INTRAVENOUS

## 2023-03-05 MED ORDER — ATORVASTATIN CALCIUM 10 MG PO TABS
10.0000 mg | ORAL_TABLET | Freq: Every day | ORAL | Status: DC
  Administered 2023-03-06 – 2023-03-07 (×2): 10 mg via ORAL
  Filled 2023-03-05 (×2): qty 1

## 2023-03-05 MED ORDER — BUPIVACAINE LIPOSOME 1.3 % IJ SUSP: 20.0000 mL | Freq: Once | INTRAMUSCULAR | Status: DC

## 2023-03-05 MED ORDER — SUGAMMADEX SODIUM 200 MG/2ML IV SOLN
INTRAVENOUS | Status: DC | PRN
  Administered 2023-03-05: 200 mg via INTRAVENOUS

## 2023-03-05 MED ORDER — PANTOPRAZOLE SODIUM 40 MG PO TBEC
40.0000 mg | DELAYED_RELEASE_TABLET | Freq: Every day | ORAL | Status: DC
  Administered 2023-03-06 – 2023-03-07 (×2): 40 mg via ORAL
  Filled 2023-03-05 (×2): qty 1

## 2023-03-05 MED ORDER — FENTANYL CITRATE (PF) 250 MCG/5ML IJ SOLN
INTRAMUSCULAR | Status: AC
  Filled 2023-03-05: qty 5

## 2023-03-05 MED ORDER — 0.9 % SODIUM CHLORIDE (POUR BTL) OPTIME
TOPICAL | Status: DC | PRN
Start: 2023-03-05 — End: 2023-03-05
  Administered 2023-03-05: 1000 mL

## 2023-03-05 MED ORDER — ACETAMINOPHEN 10 MG/ML IV SOLN
1000.0000 mg | Freq: Four times a day (QID) | INTRAVENOUS | Status: DC
  Administered 2023-03-05: 1000 mg via INTRAVENOUS
  Filled 2023-03-05: qty 100

## 2023-03-05 MED ORDER — POVIDONE-IODINE 10 % EX SWAB: 2.0000 | Freq: Once | CUTANEOUS | Status: DC

## 2023-03-05 MED ORDER — STERILE WATER FOR IRRIGATION IR SOLN
Status: DC | PRN
  Administered 2023-03-05: 2000 mL

## 2023-03-05 MED ORDER — FENTANYL CITRATE (PF) 100 MCG/2ML IJ SOLN
INTRAMUSCULAR | Status: DC | PRN
  Administered 2023-03-05 (×3): 50 ug via INTRAVENOUS
  Administered 2023-03-05: 25 ug via INTRAVENOUS
  Administered 2023-03-05: 50 ug via INTRAVENOUS

## 2023-03-05 MED ORDER — DEXAMETHASONE SODIUM PHOSPHATE 10 MG/ML IJ SOLN
INTRAMUSCULAR | Status: AC
  Filled 2023-03-05: qty 1

## 2023-03-05 MED ORDER — ROCURONIUM BROMIDE 10 MG/ML (PF) SYRINGE
PREFILLED_SYRINGE | INTRAVENOUS | Status: AC
  Filled 2023-03-05: qty 10

## 2023-03-05 MED ORDER — ONDANSETRON HCL 4 MG/2ML IJ SOLN
4.0000 mg | Freq: Four times a day (QID) | INTRAMUSCULAR | Status: DC | PRN
  Administered 2023-03-05: 4 mg via INTRAVENOUS
  Filled 2023-03-05: qty 2

## 2023-03-05 MED ORDER — LIDOCAINE HCL (CARDIAC) PF 100 MG/5ML IV SOSY
PREFILLED_SYRINGE | INTRAVENOUS | Status: DC | PRN
  Administered 2023-03-05: 40 mg via INTRAVENOUS

## 2023-03-05 MED ORDER — SODIUM CHLORIDE 0.9 % IV SOLN: INTRAVENOUS | Status: DC

## 2023-03-05 MED ORDER — METHOCARBAMOL 500 MG IVPB - SIMPLE MED
INTRAVENOUS | Status: AC
  Filled 2023-03-05: qty 55

## 2023-03-05 MED ORDER — ROCURONIUM BROMIDE 100 MG/10ML IV SOLN
INTRAVENOUS | Status: DC | PRN
  Administered 2023-03-05: 60 mg via INTRAVENOUS

## 2023-03-05 MED ORDER — PHENYLEPHRINE HCL (PRESSORS) 10 MG/ML IV SOLN
INTRAVENOUS | Status: DC | PRN
  Administered 2023-03-05 (×2): 80 ug via INTRAVENOUS

## 2023-03-05 MED ORDER — INSULIN ASPART 100 UNIT/ML IJ SOLN: 0.0000 [IU] | INTRAMUSCULAR | Status: DC | PRN

## 2023-03-05 MED ORDER — FLEET ENEMA 7-19 GM/118ML RE ENEM: 1.0000 | ENEMA | Freq: Once | RECTAL | Status: DC | PRN

## 2023-03-05 MED ORDER — PHENOL 1.4 % MT LIQD: 1.0000 | OROMUCOSAL | Status: DC | PRN

## 2023-03-05 MED ORDER — ASPIRIN 81 MG PO CHEW
81.0000 mg | CHEWABLE_TABLET | Freq: Two times a day (BID) | ORAL | Status: DC
  Administered 2023-03-06 – 2023-03-07 (×3): 81 mg via ORAL
  Filled 2023-03-05 (×3): qty 1

## 2023-03-05 MED ORDER — ONDANSETRON HCL 4 MG/2ML IJ SOLN
INTRAMUSCULAR | Status: AC
  Filled 2023-03-05: qty 2

## 2023-03-05 MED ORDER — FENTANYL CITRATE PF 50 MCG/ML IJ SOSY: 100.0000 ug | PREFILLED_SYRINGE | Freq: Once | INTRAMUSCULAR | Status: DC

## 2023-03-05 MED ORDER — FENTANYL CITRATE PF 50 MCG/ML IJ SOSY
PREFILLED_SYRINGE | INTRAMUSCULAR | Status: AC
  Filled 2023-03-05: qty 3

## 2023-03-05 MED ORDER — CHLORHEXIDINE GLUCONATE 0.12 % MT SOLN
15.0000 mL | Freq: Once | OROMUCOSAL | Status: AC
  Administered 2023-03-05: 15 mL via OROMUCOSAL

## 2023-03-05 MED ORDER — GABAPENTIN 300 MG PO CAPS
300.0000 mg | ORAL_CAPSULE | Freq: Three times a day (TID) | ORAL | Status: DC
  Administered 2023-03-05 – 2023-03-07 (×6): 300 mg via ORAL
  Filled 2023-03-05 (×6): qty 1

## 2023-03-05 MED ORDER — METOCLOPRAMIDE HCL 5 MG PO TABS: 5.0000 mg | ORAL_TABLET | Freq: Three times a day (TID) | ORAL | Status: DC | PRN

## 2023-03-05 MED ORDER — ONDANSETRON HCL 4 MG/2ML IJ SOLN: 4.0000 mg | Freq: Once | INTRAMUSCULAR | Status: DC | PRN

## 2023-03-05 MED ORDER — OXYCODONE HCL 5 MG PO TABS
5.0000 mg | ORAL_TABLET | ORAL | Status: DC | PRN
  Administered 2023-03-05: 10 mg via ORAL
  Administered 2023-03-05: 5 mg via ORAL
  Administered 2023-03-06 (×3): 10 mg via ORAL
  Filled 2023-03-05: qty 2
  Filled 2023-03-05: qty 1
  Filled 2023-03-05 (×2): qty 2

## 2023-03-05 MED ORDER — PROPOFOL 10 MG/ML IV BOLUS
INTRAVENOUS | Status: AC
  Filled 2023-03-05: qty 20

## 2023-03-05 MED ORDER — BUPIVACAINE LIPOSOME 1.3 % IJ SUSP
INTRAMUSCULAR | Status: DC | PRN
  Administered 2023-03-05: 20 mL

## 2023-03-05 MED ORDER — SODIUM CHLORIDE (PF) 0.9 % IJ SOLN
INTRAMUSCULAR | Status: AC
  Filled 2023-03-05: qty 50

## 2023-03-05 MED ORDER — DIPHENHYDRAMINE HCL 12.5 MG/5ML PO ELIX
12.5000 mg | ORAL_SOLUTION | ORAL | Status: DC | PRN
  Administered 2023-03-06: 25 mg via ORAL
  Filled 2023-03-05: qty 10

## 2023-03-05 MED ORDER — LEVOTHYROXINE SODIUM 75 MCG PO TABS
75.0000 ug | ORAL_TABLET | Freq: Every day | ORAL | Status: DC
  Administered 2023-03-06 – 2023-03-07 (×2): 75 ug via ORAL
  Filled 2023-03-05 (×2): qty 1

## 2023-03-05 MED ORDER — BUPIVACAINE LIPOSOME 1.3 % IJ SUSP
INTRAMUSCULAR | Status: AC
  Filled 2023-03-05: qty 20

## 2023-03-05 MED ORDER — MIDAZOLAM HCL 2 MG/2ML IJ SOLN
INTRAMUSCULAR | Status: AC
  Filled 2023-03-05: qty 2

## 2023-03-05 MED ORDER — OXYCODONE HCL 5 MG PO TABS
5.0000 mg | ORAL_TABLET | Freq: Once | ORAL | Status: AC | PRN
  Administered 2023-03-05: 5 mg via ORAL

## 2023-03-05 MED ORDER — ACETAMINOPHEN 325 MG PO TABS: 325.0000 mg | ORAL_TABLET | ORAL | Status: DC | PRN

## 2023-03-05 MED ORDER — MOMETASONE FURO-FORMOTEROL FUM 200-5 MCG/ACT IN AERO
2.0000 | INHALATION_SPRAY | Freq: Two times a day (BID) | RESPIRATORY_TRACT | Status: DC
  Administered 2023-03-05 – 2023-03-07 (×4): 2 via RESPIRATORY_TRACT
  Filled 2023-03-05: qty 8.8

## 2023-03-05 MED ORDER — FENTANYL CITRATE PF 50 MCG/ML IJ SOSY
25.0000 ug | PREFILLED_SYRINGE | INTRAMUSCULAR | Status: DC | PRN
  Administered 2023-03-05 (×3): 50 ug via INTRAVENOUS

## 2023-03-05 MED ORDER — ROPIVACAINE HCL 5 MG/ML IJ SOLN
INTRAMUSCULAR | Status: DC | PRN
  Administered 2023-03-05: 20 mL via PERINEURAL

## 2023-03-05 MED ORDER — HYDROMORPHONE HCL 2 MG/ML IJ SOLN
INTRAMUSCULAR | Status: AC
  Filled 2023-03-05: qty 1

## 2023-03-05 MED ORDER — VANCOMYCIN HCL 1000 MG IV SOLR
INTRAVENOUS | Status: AC
  Filled 2023-03-05: qty 20

## 2023-03-05 MED ORDER — ONDANSETRON HCL 4 MG PO TABS: 4.0000 mg | ORAL_TABLET | Freq: Four times a day (QID) | ORAL | Status: DC | PRN

## 2023-03-05 MED ORDER — MEPERIDINE HCL 50 MG/ML IJ SOLN: 6.2500 mg | INTRAMUSCULAR | Status: DC | PRN

## 2023-03-05 MED ORDER — INSULIN ASPART 100 UNIT/ML IJ SOLN: 0.0000 [IU] | Freq: Every day | INTRAMUSCULAR | Status: DC

## 2023-03-05 MED ORDER — HYDROMORPHONE HCL 1 MG/ML IJ SOLN: 0.5000 mg | INTRAMUSCULAR | Status: DC | PRN

## 2023-03-05 MED ORDER — ONDANSETRON HCL 4 MG/2ML IJ SOLN
INTRAMUSCULAR | Status: DC | PRN
  Administered 2023-03-05: 4 mg via INTRAVENOUS

## 2023-03-05 MED ORDER — SODIUM CHLORIDE (PF) 0.9 % IJ SOLN
INTRAMUSCULAR | Status: DC | PRN
  Administered 2023-03-05: 60 mL

## 2023-03-05 MED ORDER — OXYCODONE HCL 5 MG PO TABS
10.0000 mg | ORAL_TABLET | ORAL | Status: DC | PRN
  Administered 2023-03-06 – 2023-03-07 (×3): 10 mg via ORAL
  Filled 2023-03-05: qty 2
  Filled 2023-03-05: qty 3
  Filled 2023-03-05 (×2): qty 2

## 2023-03-05 MED ORDER — METOCLOPRAMIDE HCL 5 MG/ML IJ SOLN
5.0000 mg | Freq: Three times a day (TID) | INTRAMUSCULAR | Status: DC | PRN
  Administered 2023-03-05: 10 mg via INTRAVENOUS
  Filled 2023-03-05: qty 2

## 2023-03-05 MED ORDER — CEFAZOLIN SODIUM-DEXTROSE 2-4 GM/100ML-% IV SOLN
2.0000 g | Freq: Four times a day (QID) | INTRAVENOUS | Status: AC
  Administered 2023-03-05 (×2): 2 g via INTRAVENOUS
  Filled 2023-03-05 (×2): qty 100

## 2023-03-05 MED ORDER — ACETAMINOPHEN 500 MG PO TABS
1000.0000 mg | ORAL_TABLET | Freq: Four times a day (QID) | ORAL | Status: AC
  Administered 2023-03-05 – 2023-03-06 (×3): 1000 mg via ORAL
  Filled 2023-03-05 (×3): qty 2

## 2023-03-05 MED ORDER — MIDAZOLAM HCL 5 MG/5ML IJ SOLN
INTRAMUSCULAR | Status: DC | PRN
  Administered 2023-03-05: 1 mg via INTRAVENOUS
  Administered 2023-03-05 (×2): .5 mg via INTRAVENOUS

## 2023-03-05 MED ORDER — HYDROMORPHONE HCL 1 MG/ML IJ SOLN
INTRAMUSCULAR | Status: DC | PRN
  Administered 2023-03-05: .5 mg via INTRAVENOUS
  Administered 2023-03-05: .25 mg via INTRAVENOUS
  Administered 2023-03-05: .75 mg via INTRAVENOUS
  Administered 2023-03-05: .5 mg via INTRAVENOUS

## 2023-03-05 SURGICAL SUPPLY — 68 items
ADAPTER BOLT FEMORAL +2/-2 (Knees) IMPLANT
ADPR FEM +2/-2 OFST BOLT (Knees) ×1 IMPLANT
ADPR FEM 5D STRL KN PFC SGM (Orthopedic Implant) ×1 IMPLANT
AUG DIST COMBO 2.5 12 RT (Orthopedic Implant) ×2 IMPLANT
AUG FEM SZ2.5 4 CMB POST STRL (Knees) ×2 IMPLANT
AUGMENT DIST COMBO 2.5 12 RT (Orthopedic Implant) IMPLANT
BAG COUNTER SPONGE SURGICOUNT (BAG) IMPLANT
BAG SPEC THK2 15X12 ZIP CLS (MISCELLANEOUS)
BAG SPNG CNTER NS LX DISP (BAG)
BAG ZIPLOCK 12X15 (MISCELLANEOUS) IMPLANT
BLADE SAG 18X100X1.27 (BLADE) ×1 IMPLANT
BLADE SAW SGTL 11.0X1.19X90.0M (BLADE) ×1 IMPLANT
BLADE SURG SZ10 CARB STEEL (BLADE) IMPLANT
BNDG CMPR 6 X 5 YARDS HK CLSR (GAUZE/BANDAGES/DRESSINGS) ×1
BNDG ELASTIC 6INX 5YD STR LF (GAUZE/BANDAGES/DRESSINGS) ×1 IMPLANT
BONE CEMENT GENTAMICIN (Cement) ×3 IMPLANT
CEMENT BONE GENTAMICIN 40 (Cement) ×3 IMPLANT
COVER SURGICAL LIGHT HANDLE (MISCELLANEOUS) ×1 IMPLANT
CUFF TOURN SGL QUICK 34 (TOURNIQUET CUFF) ×1
CUFF TRNQT CYL 34X4.125X (TOURNIQUET CUFF) ×1 IMPLANT
DRAPE U-SHAPE 47X51 STRL (DRAPES) ×1 IMPLANT
DRSG AQUACEL AG ADV 3.5X10 (GAUZE/BANDAGES/DRESSINGS) ×1 IMPLANT
DURAPREP 26ML APPLICATOR (WOUND CARE) ×1 IMPLANT
ELECT REM PT RETURN 15FT ADLT (MISCELLANEOUS) ×1 IMPLANT
FEM TC3 RT PFC SIGMA SZ2.5 (Orthopedic Implant) ×1 IMPLANT
FEMORAL ADAPTER (Orthopedic Implant) IMPLANT
FEMORAL TC3 RT PFC SIGMA SZ2.5 (Orthopedic Implant) IMPLANT
GLOVE BIO SURGEON STRL SZ 6.5 (GLOVE) IMPLANT
GLOVE BIO SURGEON STRL SZ7 (GLOVE) IMPLANT
GLOVE BIO SURGEON STRL SZ8 (GLOVE) ×2 IMPLANT
GLOVE BIOGEL PI IND STRL 7.0 (GLOVE) IMPLANT
GLOVE BIOGEL PI IND STRL 8 (GLOVE) ×1 IMPLANT
GOWN STRL REUS W/ TWL LRG LVL3 (GOWN DISPOSABLE) ×1 IMPLANT
GOWN STRL REUS W/TWL LRG LVL3 (GOWN DISPOSABLE) ×1
HANDPIECE INTERPULSE COAX TIP (DISPOSABLE) ×1
HOLDER FOLEY CATH W/STRAP (MISCELLANEOUS) IMPLANT
IMMOBILIZER KNEE 20 (SOFTGOODS) ×1
IMMOBILIZER KNEE 20 THIGH 36 (SOFTGOODS) ×1 IMPLANT
INSERT TIB SIGMA ROT 2.5 67X25 IMPLANT
KIT TURNOVER KIT A (KITS) IMPLANT
MANIFOLD NEPTUNE II (INSTRUMENTS) ×1 IMPLANT
NS IRRIG 1000ML POUR BTL (IV SOLUTION) ×1 IMPLANT
PACK TOTAL KNEE CUSTOM (KITS) ×1 IMPLANT
PADDING CAST COTTON 6X4 STRL (CAST SUPPLIES) ×2 IMPLANT
PATELLA DOME PFC 35MM (Knees) IMPLANT
PIN STEINMAN FIXATION KNEE (PIN) IMPLANT
POST AUG PFC 4MM SZ 2.5 (Knees) IMPLANT
PROTECTOR NERVE ULNAR (MISCELLANEOUS) ×1 IMPLANT
SET HNDPC FAN SPRY TIP SCT (DISPOSABLE) ×1 IMPLANT
SLEEVE UNIV FEM DIST PRO SZ 31 (Sleeve) IMPLANT
SPIKE FLUID TRANSFER (MISCELLANEOUS) IMPLANT
STAPLER VISISTAT 35W (STAPLE) IMPLANT
STEM UNIVERSAL 115X12MM (Stem) IMPLANT
STRIP CLOSURE SKIN 1/2X4 (GAUZE/BANDAGES/DRESSINGS) ×2 IMPLANT
SUT MNCRL AB 4-0 PS2 18 (SUTURE) ×1 IMPLANT
SUT STRATAFIX 0 PDS 27 VIOLET (SUTURE) ×1
SUT VIC AB 2-0 CT1 27 (SUTURE) ×3
SUT VIC AB 2-0 CT1 TAPERPNT 27 (SUTURE) ×3 IMPLANT
SUTURE STRATFX 0 PDS 27 VIOLET (SUTURE) ×1 IMPLANT
SWAB COLLECTION DEVICE MRSA (MISCELLANEOUS) IMPLANT
SWAB CULTURE ESWAB REG 1ML (MISCELLANEOUS) IMPLANT
TOWER CARTRIDGE SMART MIX (DISPOSABLE) ×1 IMPLANT
TRAY FOLEY MTR SLVR 16FR STAT (SET/KITS/TRAYS/PACK) ×1 IMPLANT
TRAY FOLEY W/BAG SLVR 14FR LF (SET/KITS/TRAYS/PACK) IMPLANT
TUBE KAMVAC SUCTION (TUBING) IMPLANT
TUBE SUCTION HIGH CAP CLEAR NV (SUCTIONS) ×1 IMPLANT
WATER STERILE IRR 1000ML POUR (IV SOLUTION) ×1 IMPLANT
WRAP KNEE MAXI GEL POST OP (GAUZE/BANDAGES/DRESSINGS) IMPLANT

## 2023-03-05 NOTE — Anesthesia Postprocedure Evaluation (Signed)
Anesthesia Post Note  Patient: Tina Neal  Procedure(s) Performed: Right knee femoral arthroplasty revision (Right: Knee)     Patient location during evaluation: PACU Anesthesia Type: General Level of consciousness: awake and alert Pain management: pain level controlled Vital Signs Assessment: post-procedure vital signs reviewed and stable Respiratory status: spontaneous breathing, nonlabored ventilation, respiratory function stable and patient connected to nasal cannula oxygen Cardiovascular status: blood pressure returned to baseline and stable Postop Assessment: no apparent nausea or vomiting Anesthetic complications: no   No notable events documented.  Last Vitals:  Vitals:   03/05/23 0601 03/05/23 0936  BP: (!) 147/85 (!) 149/85  Pulse: 75 96  Resp: 18 19  Temp: (!) 36.3 C (!) 36.3 C  SpO2:  100%    Last Pain:  Vitals:   03/05/23 0936  TempSrc:   PainSc: 8                  Shaquala Broeker

## 2023-03-05 NOTE — Brief Op Note (Signed)
03/05/2023  9:07 AM  PATIENT:  Roselle Locus  72 y.o. female  PRE-OPERATIVE DIAGNOSIS:  Failed right total knee arthroplasty  POST-OPERATIVE DIAGNOSIS:  Failed right total knee arthroplasty  PROCEDURE:  Right knee femoral and patellar revision  SURGEON:  Surgeons and Role:    Ollen Gross, MD - Primary  PHYSICIAN ASSISTANT:   ASSISTANTS: Arther Abbott, PA-C   ANESTHESIA:   regional and general  EBL:  25 ml  BLOOD ADMINISTERED:none  DRAINS: none   LOCAL MEDICATIONS USED:  OTHER [employer]  COUNTS:  YES  TOURNIQUET:   Total Tourniquet Time Documented: Thigh (Right) - 60 minutes Total: Thigh (Right) - 60 minutes   DICTATION: .Other Dictation: Dictation Number [id number]  PLAN OF CARE: Admit to inpatient   PATIENT DISPOSITION:  PACU - hemodynamically stable.

## 2023-03-05 NOTE — Anesthesia Procedure Notes (Signed)
Procedure Name: Intubation Date/Time: 03/05/2023 7:39 AM  Performed by: Garth Bigness, CRNAPre-anesthesia Checklist: Patient identified, Emergency Drugs available, Suction available and Patient being monitored Patient Re-evaluated:Patient Re-evaluated prior to induction Oxygen Delivery Method: Circle system utilized Preoxygenation: Pre-oxygenation with 100% oxygen Induction Type: IV induction Ventilation: Mask ventilation without difficulty Laryngoscope Size: Mac and 3 Grade View: Grade II Tube type: Oral Tube size: 7.0 mm Number of attempts: 1 Airway Equipment and Method: Stylet and Oral airway Placement Confirmation: ETT inserted through vocal cords under direct vision, positive ETCO2 and breath sounds checked- equal and bilateral Secured at: 22 cm Tube secured with: Tape Dental Injury: Teeth and Oropharynx as per pre-operative assessment

## 2023-03-05 NOTE — Op Note (Signed)
NAME: Tina Neal, Tina A. MEDICAL RECORD NO: [id number] ACCOUNT NO: [id number] DATE OF BIRTH: [birth year] FACILITY: [facility] LOCATION: [facility] PHYSICIAN: Gus Rankin. Zaim Nitta, MD  Operative Report   DATE OF PROCEDURE: 03/05/2023  PREOPERATIVE DIAGNOSIS:  Failed right total knee arthroplasty.  POSTOPERATIVE DIAGNOSIS:  Failed right total knee arthroplasty.  PROCEDURE:  Right knee femoral and patellar revision.  SURGEON:  Gus Rankin. Tabor Bartram, MD  ASSISTANT:  Arther Abbott, PA-C.  ANESTHESIA:  General and adductor canal block.  ESTIMATED BLOOD LOSS:  25 mL.  DRAINS:  None.  TOURNIQUET TIME:  60 minutes at 300 mmHg.  COMPLICATIONS:  None.  CONDITION:  Stable to recovery.  BRIEF CLINICAL NOTE:  Farrel is a 72 year old female with a long complex history with regards to her right knee.  She has had multiple surgeries including two revisions.  She has done very well for the past couple of years.  It was noted that she had  femoral loosening.  Infection workup was negative.  She presents now for femoral versus total knee revision.  DESCRIPTION OF PROCEDURE:  After successful administration of adductor canal block and general anesthetic, a tourniquet was placed on her right thigh and right lower extremity prepped and draped in the usual sterile fashion.  Extremity was wrapped in  Esmarch and tourniquet inflated to 300 mmHg.  Midline incision was made with a 10 blade through subcutaneous tissue to the level of the extensor mechanism.  A fresh blade was used to make a medial parapatellar arthrotomy.  There was minimal fluid in the  joint.  The fluid was sent for Gram stain, culture and sensitivity.  Soft tissue over the proximal medial tibia subperiosteally elevated to the joint line with a knife and into the semimembranosus bursa with a Cobb elevator.  Soft tissue laterally was  elevated with attention being paid to avoiding the patellar tendon on tibial tubercle.  Patella was everted, knee flexed 90  degrees.  We then performed a synovectomy removing the polyethylene wear debris, which was present through the synovium.  This was  performed medially, laterally and superiorly.  I also removed the anterior inferior scarring.  This allowed the patella to evert easier.  We flex the knee 90 degrees and subluxed the tibia anteriorly to get to remove the tibial polyethylene.  This was a  20 mm thick TC3 rotating platform.  There was significant poly wear.  I removed soft tissue from around the femoral component.  The femoral component appears to be loose and upon examining it and then placed an osteotome at the junction between the  component and bone, it is removed easily with signs of a complete loosening.  On the tibial side, the revision component was well fixed and in excellent position, so it was left intact.  The patella was not resurfaced.  Previously, she had resurfacing  but the composite thickness was too small, so she did not have a patellar component placed at the last surgery.  Some of the bone was reconstituted, so it is thick enough to do a patellar component today.  We then started preparing the femur.  I removed the granulation tissue from the femoral canal.  We then drilled through the pedestal in the canal and passed a guidewire to ensure that we were within the femoral canal throughout the entire extent.  I then  started reaming up to 12 mm for placement of a 12 x 115 stem.  I then left the twelve in there to serve as intramedullary cutting  guide and then placed distal femoral cutting block to shave a small amount of bone off the distal femur.  She previously  had 8 mm distal augments and I felt we needed to go to 12 to get the joint line back down to a more normal position.  We then prepared for a sleeve with the 31 broach and that had a great fit, thus we knew we needed to use a 31 sleeve.  We then  formulated a trial femur, which was a size 2.5 TC3 femur with 12 mm distal augments medial  and lateral, 4 mm posterior augments medial and lateral, a 31 sleeve and a 12 x 115 stem.  This is placed and impacted into the femur with excellent fit.  With  that sleeve in, it was very stable.  As stated earlier, the tibia was very stable, thus it was left intact.  We went from a 20 to a 25 mm thick insert.  With a 25, full extension was achieved with excellent varus, valgus, and AP balance throughout full  range of motion.  I then everted the patella.  Thickness was measured to be 20 mm.  Freehand resection was taken to 10 mm.  A 35 template is placed, lug holes were drilled.  A trial patella was placed and it tracks normally.  We then removed the trials and thoroughly irrigated the joint while the components were assembled on the back table.  I also injected the extensor mechanism, posterior capsule and periosteum of the femur with 20 mL of Exparel mixed with 60 mL of saline.   Once the components were assembled, then the cement is mixed.  Once ready for implantation, we cemented distally on the femoral component, which was again a size 2.5 TC3 femur with a 31 sleeve 12 mm distal augments medial and lateral, 4 mm posterior  augments medial and lateral and a 12 x 115 stem extension.  This was impacted and all extruded cement was removed.  The trial 25 insert was placed with great stability throughout full range of motion.  We also cemented the 35 patella in place and held  that with a clamp.  Once the cement was fully hardened, then the permanent 25 mm TC3 rotating platform insert was placed into the tibial tray.  The knee was reduced with outstanding stability.  Patellar clamp was removed.  The wound was copiously  irrigated with saline solution with pulsatile lavage.  The arthrotomy was then closed with a running 0 PDS suture.  Further irrigation was performed and then the tourniquet released for a total time of 60 minutes.  Subcutaneous was closed with  interrupted 2-0 Vicryl and skin with staples.   Incision was cleaned and dried and a bulky sterile dressing applied. She was then awakened and transferred to recovery in stable condition.  Note that a surgical assistant was of medical necessity for this procedure to do it in a safe and expeditious manner.  Surgical assistant was necessary for retraction of vital ligaments and neurovascular structures and for proper positioning of the limb,  for removal of the old implant and safe and accurate placement of the new implant.   MUK D: 03/05/2023 9:16:29 am T: 03/05/2023 9:30:00 am  JOB: [id number]/ [id number]

## 2023-03-05 NOTE — Discharge Instructions (Signed)
Tina Gross, MD Total Joint Specialist [facility] [place] [address] [place], [place] [address] [phone]  TOTAL KNEE REVISION POSTOPERATIVE DIRECTIONS  Knee Rehabilitation, Guidelines Following Surgery  Results after knee surgery are often greatly improved when you follow the exercise, range of motion and muscle strengthening exercises prescribed by your doctor. Safety measures are also important to protect the knee from further injury. If any of these exercises cause you to have increased pain or swelling in your knee joint, decrease the amount until you are comfortable again and slowly increase them. If you have problems or questions, call your caregiver or physical therapist for advice.   BLOOD CLOT PREVENTION Take an 81 mg Aspirin two times a day for three weeks following surgery. Then take an 81 mg Aspirin once a day for three weeks. Then discontinue Aspirin. You may resume your vitamins/supplements upon discharge from the hospital. Do not take any NSAIDs (Advil, Aleve, Ibuprofen, Meloxicam, etc.) until you are 3 weeks out from surgery  HOME CARE INSTRUCTIONS  Remove items at home which could result in a fall. This includes throw rugs or furniture in walking pathways.  ICE to the affected knee as much as tolerated. Icing helps control swelling. If the swelling is well controlled you will be more comfortable and rehab easier. Continue to use ice on the knee for pain and swelling from surgery. You may notice swelling that will progress down to the foot and ankle. This is normal after surgery. Elevate the leg when you are not up walking on it.    Continue to use the breathing machine which will help keep your temperature down. It is common for your temperature to cycle up and down following surgery, especially at night when you are not up moving around and exerting yourself. The breathing machine keeps your lungs expanded and your temperature down. Do not  place pillow under the operative knee, focus on keeping the knee straight while resting  DIET You may resume your previous home diet once you are discharged from the hospital.  DRESSING / WOUND CARE / SHOWERING Keep your bulky bandage on for 2 days. On the third post-operative day you may remove the Ace bandage and gauze. There is a waterproof adhesive bandage on your skin which will stay in place until your first follow-up appointment. Once you remove this you will not need to place another bandage You may begin showering 3 days following surgery, but do not submerge the incision under water.  ACTIVITY For the first 5 days, the key is rest and control of pain and swelling Do your home exercises twice a day starting on post-operative day 3. On the days you go to physical therapy, just do the home exercises once that day. You should rest, ice and elevate the leg for 50 minutes out of every hour. Get up and walk/stretch for 10 minutes per hour. After 5 days you can increase your activity slowly as tolerated. Walk with your walker as instructed. Use the walker until you are comfortable transitioning to a cane. Walk with the cane in the opposite hand of the operative leg. You may discontinue the cane once you are comfortable and walking steadily. Avoid periods of inactivity such as sitting longer than an hour when not asleep. This helps prevent blood clots.  You may discontinue the knee immobilizer once you are able to perform a straight leg raise while lying down. You may resume a sexual relationship in one month or when  given the OK by your doctor.  You may return to work once you are cleared by your doctor.  Do not drive a car for 6 weeks or until released by your surgeon.  Do not drive while taking narcotics.  TED HOSE STOCKINGS Wear the elastic stockings on both legs for three weeks following surgery during the day. You may remove them at night for sleeping.  WEIGHT BEARING Weight bearing  as tolerated with assist device (walker, cane, etc) as directed, use it as long as suggested by your surgeon or therapist, typically at least 4-6 weeks.  POSTOPERATIVE CONSTIPATION PROTOCOL Constipation - defined medically as fewer than three stools per week and severe constipation as less than one stool per week.  One of the most common issues patients have following surgery is constipation.  Even if you have a regular bowel pattern at home, your normal regimen is likely to be disrupted due to multiple reasons following surgery.  Combination of anesthesia, postoperative narcotics, change in appetite and fluid intake all can affect your bowels.  In order to avoid complications following surgery, here are some recommendations in order to help you during your recovery period.  Colace (docusate) - Pick up an over-the-counter form of Colace or another stool softener and take twice a day as long as you are requiring postoperative pain medications.  Take with a full glass of water daily.  If you experience loose stools or diarrhea, hold the colace until you stool forms back up. If your symptoms do not get better within 1 week or if they get worse, check with your doctor. Dulcolax (bisacodyl) - Pick up over-the-counter and take as directed by the product packaging as needed to assist with the movement of your bowels.  Take with a full glass of water.  Use this product as needed if not relieved by Colace only.  MiraLax (polyethylene glycol) - Pick up over-the-counter to have on hand. MiraLax is a solution that will increase the amount of water in your bowels to assist with bowel movements.  Take as directed and can mix with a glass of water, juice, soda, coffee, or tea. Take if you go more than two days without a movement. Do not use MiraLax more than once per day. Call your doctor if you are still constipated or irregular after using this medication for 7 days in a row.  If you continue to have problems with  postoperative constipation, please contact the office for further assistance and recommendations.  If you experience "the worst abdominal pain ever" or develop nausea or vomiting, please contact the office immediatly for further recommendations for treatment.  ITCHING If you experience itching with your medications, try taking only a single pain pill, or even half a pain pill at a time.  You can also use Benadryl over the counter for itching or also to help with sleep.   MEDICATIONS See your medication summary on the "After Visit Summary" that the nursing staff will review with you prior to discharge.  You may have some home medications which will be placed on hold until you complete the course of blood thinner medication.  It is important for you to complete the blood thinner medication as prescribed by your surgeon.  Continue your approved medications as instructed at time of discharge.  PRECAUTIONS If you experience chest pain or shortness of breath - call 911 immediately for transfer to the hospital emergency department.  If you develop a fever greater that 101 F, purulent drainage  from wound, increased redness or drainage from wound, foul odor from the wound/dressing, or calf pain - CONTACT YOUR SURGEON.                                                   FOLLOW-UP APPOINTMENTS Make sure you keep all of your appointments after your operation with your surgeon and caregivers. You should call the office at the above phone number and make an appointment for approximately two weeks after the date of your surgery or on the date instructed by your surgeon outlined in the "After Visit Summary".  RANGE OF MOTION AND STRENGTHENING EXERCISES  Rehabilitation of the knee is important following a knee injury or an operation. After just a few days of immobilization, the muscles of the thigh which control the knee become weakened and shrink (atrophy). Knee exercises are designed to build up the tone and strength  of the thigh muscles and to improve knee motion. Often times heat used for twenty to thirty minutes before working out will loosen up your tissues and help with improving the range of motion but do not use heat for the first two weeks following surgery. These exercises can be done on a training (exercise) mat, on the floor, on a table or on a bed. Use what ever works the best and is most comfortable for you Knee exercises include:  Leg Lifts - While your knee is still immobilized in a splint or cast, you can do straight leg raises. Lift the leg to 60 degrees, hold for 3 sec, and slowly lower the leg. Repeat 10-20 times 2-3 times daily. Perform this exercise against resistance later as your knee gets better.  Quad and Hamstring Sets - Tighten up the muscle on the front of the thigh (Quad) and hold for 5-10 sec. Repeat this 10-20 times hourly. Hamstring sets are done by pushing the foot backward against an object and holding for 5-10 sec. Repeat as with quad sets.  Leg Slides: Lying on your back, slowly slide your foot toward your buttocks, bending your knee up off the floor (only go as far as is comfortable). Then slowly slide your foot back down until your leg is flat on the floor again. Angel Wings: Lying on your back spread your legs to the side as far apart as you can without causing discomfort.  A rehabilitation program following serious knee injuries can speed recovery and prevent re-injury in the future due to weakened muscles. Contact your doctor or a physical therapist for more information on knee rehabilitation.   POST-OPERATIVE OPIOID TAPER INSTRUCTIONS: It is important to wean off of your opioid medication as soon as possible. If you do not need pain medication after your surgery it is ok to stop day one. Opioids include: Codeine, Hydrocodone(Norco, Vicodin), Oxycodone(Percocet, oxycontin) and hydromorphone amongst others.  Long term and even short term use of opiods can cause: Increased pain  response Dependence Constipation Depression Respiratory depression And more.  Withdrawal symptoms can include Flu like symptoms Nausea, vomiting And more Techniques to manage these symptoms Hydrate well Eat regular healthy meals Stay active Use relaxation techniques(deep breathing, meditating, yoga) Do Not substitute Alcohol to help with tapering If you have been on opioids for less than two weeks and do not have pain than it is ok to stop all together.  Plan to wean  off of opioids This plan should start within one week post op of your joint replacement. Maintain the same interval or time between taking each dose and first decrease the dose.  Cut the total daily intake of opioids by one tablet each day Next start to increase the time between doses. The last dose that should be eliminated is the evening dose.   IF YOU ARE TRANSFERRED TO A SKILLED REHAB FACILITY If the patient is transferred to a skilled rehab facility following release from the hospital, a list of the current medications will be sent to the facility for the patient to continue.  When discharged from the skilled rehab facility, please have the facility set up the patient's Home Health Physical Therapy prior to being released. Also, the skilled facility will be responsible for providing the patient with their medications at time of release from the facility to include their pain medication, the muscle relaxants, and their blood thinner medication. If the patient is still at the rehab facility at time of the two week follow up appointment, the skilled rehab facility will also need to assist the patient in arranging follow up appointment in our office and any transportation needs.  MAKE SURE YOU:  Understand these instructions.  Get help right away if you are not doing well or get worse.   DENTAL ANTIBIOTICS:  In most cases prophylactic antibiotics for Dental procdeures after total joint surgery are not  necessary.  Exceptions are as follows:  1. History of prior total joint infection  2. Severely immunocompromised (Organ Transplant, cancer chemotherapy, Rheumatoid biologic meds such as Humera)  3. Poorly controlled diabetes (A1C &gt; 8.0, blood glucose over 200)  If you have one of these conditions, contact your surgeon for an antibiotic prescription, prior to your dental procedure.    Pick up stool softner and laxative for home use following surgery while on pain medications. Do not submerge incision under water. Please use good hand washing techniques while changing dressing each day. May shower starting three days after surgery. Please use a clean towel to pat the incision dry following showers. Continue to use ice for pain and swelling after surgery. Do not use any lotions or creams on the incision until instructed by your surgeon.

## 2023-03-05 NOTE — Plan of Care (Signed)
  Problem: Nutritional: Goal: Maintenance of adequate nutrition will improve Outcome: Progressing   Problem: Skin Integrity: Goal: Risk for impaired skin integrity will decrease Outcome: Progressing   Problem: Education: Goal: Knowledge of the prescribed therapeutic regimen will improve Outcome: Progressing Goal: Individualized Educational Video(s) Outcome: Progressing   Problem: Pain Management: Goal: Pain level will decrease with appropriate interventions Outcome: Progressing   Problem: Education: Goal: Knowledge of General Education information will improve Description: Including pain rating scale, medication(s)/side effects and non-pharmacologic comfort measures Outcome: Progressing   Problem: Activity: Goal: Risk for activity intolerance will decrease Outcome: Progressing   Problem: Nutrition: Goal: Adequate nutrition will be maintained Outcome: Progressing   Problem: Elimination: Goal: Will not experience complications related to bowel motility Outcome: Progressing

## 2023-03-05 NOTE — Anesthesia Procedure Notes (Addendum)
Anesthesia Regional Block: Adductor canal block   Pre-Anesthetic Checklist: , timeout performed,  Correct Patient, Correct Site, Correct Laterality,  Correct Procedure, Correct Position, site marked,  Risks and benefits discussed,  Surgical consent,  Pre-op evaluation,  At surgeon's request and post-op pain management  Laterality: Right  Prep: chloraprep       Needles:  Injection technique: Single-shot  Needle Type: Echogenic Stimulator Needle     Needle Length: 5cm  Needle Gauge: 22     Additional Needles:   Procedures:, nerve stimulator,,, ultrasound used (permanent image in chart),,    Narrative:  Start time: 03/05/2023 7:00 AM End time: 03/05/2023 7:08 AM Injection made incrementally with aspirations every 5 mL.  Performed by: Personally  Anesthesiologist: Bethena Midget, MD  Additional Notes: Functioning IV was confirmed and monitors were applied.  A 50mm 22ga Arrow echogenic stimulator needle was used. Sterile prep and drape,hand hygiene and sterile gloves were used. Ultrasound guidance: relevant anatomy identified, needle position confirmed, local anesthetic spread visualized around nerve(s)., vascular puncture avoided.  Image printed for medical record. Negative aspiration and negative test dose prior to incremental administration of local anesthetic. The patient tolerated the procedure well.

## 2023-03-05 NOTE — Transfer of Care (Signed)
Immediate Anesthesia Transfer of Care Note  Patient: Tina Neal  Procedure(s) Performed: Right knee femoral arthroplasty revision (Right: Knee)  Patient Location: PACU  Anesthesia Type:General  Level of Consciousness: awake, alert , oriented, and patient cooperative  Airway & Oxygen Therapy: Patient Spontanous Breathing and Patient connected to face mask oxygen  Post-op Assessment: Report given to RN and Post -op Vital signs reviewed and stable  Post vital signs: Reviewed and stable  Last Vitals:  Vitals Value Taken Time  BP 149/85 03/05/23 0936  Temp 36.3 C 03/05/23 0936  Pulse 91 03/05/23 0937  Resp 16 03/05/23 0937  SpO2 100 % 03/05/23 0937  Vitals shown include unfiled device data.  Last Pain:  Vitals:   03/05/23 0936  TempSrc:   PainSc: Asleep         Complications: No notable events documented.

## 2023-03-05 NOTE — Progress Notes (Signed)
Orthopedic Tech Progress Note Patient Details:  Tina Neal 1951-05-03 [id number] Applied CPM per order. Will remove CPM at 2:30 pm.  CPM Right Knee CPM Right Knee: On Right Knee Flexion (Degrees): 40 Right Knee Extension (Degrees): 10  Post Interventions Patient Tolerated: Well Instructions Provided: Adjustment of device, Care of device Ortho Devices Type of Ortho Device: CPM padding Ortho Device/Splint Location: RLE Ortho Device/Splint Interventions: Ordered, Application, Adjustment   Post Interventions Patient Tolerated: Well Instructions Provided: Adjustment of device, Care of device  Blase Mess 03/05/2023, 10:34 AM

## 2023-03-05 NOTE — Evaluation (Signed)
Physical Therapy Evaluation Patient Details Name: Tina Neal MRN: [id number] DOB: January 26, 1951 Today's Date: 03/05/2023  History of Present Illness  72 y.o. female admitted for R patellar and femoral knee revision. PMH: L2-L4 lumbar fusion January 2023, OA, DM, HLD, HTN, pancreatitis, COPD.  Clinical Impression  Pt admitted with above diagnosis. Pt ambulated 15' with RW, no loss of balance, distance limited by pain and dizziness, assisted pt to recliner where BP was 123/81, HR 77. Good progress expected.  Pt currently with functional limitations due to the deficits listed below (see PT Problem List). Pt will benefit from acute skilled PT to increase their independence and safety with mobility to allow discharge.           If plan is discharge home, recommend the following: A little help with walking and/or transfers;A little help with bathing/dressing/bathroom;Assistance with cooking/housework;Assist for transportation;Help with stairs or ramp for entrance   Can travel by private vehicle        Equipment Recommendations None recommended by PT  Recommendations for Other Services       Functional Status Assessment Patient has had a recent decline in their functional status and demonstrates the ability to make significant improvements in function in a reasonable and predictable amount of time.     Precautions / Restrictions Precautions Precautions: Knee;Fall Restrictions Weight Bearing Restrictions: No Other Position/Activity Restrictions: WBAT      Mobility  Bed Mobility Overal bed mobility: Needs Assistance Bed Mobility: Supine to Sit     Supine to sit: Min assist, HOB elevated     General bed mobility comments: min A to raise trunk and pivot hips to edge of bed    Transfers Overall transfer level: Needs assistance Equipment used: Rolling walker (2 wheels) Transfers: Sit to/from Stand Sit to Stand: Min assist, From elevated surface           General transfer  comment: VCs hand placement    Ambulation/Gait Ambulation/Gait assistance: Min guard Gait Distance (Feet): 15 Feet Assistive device: Rolling walker (2 wheels) Gait Pattern/deviations: Step-to pattern, Decreased step length - right, Decreased step length - left Gait velocity: decr     General Gait Details: VCs sequencing, distance limited by dizziness, assisted to recliner where BP was 123/81, HR 77  Stairs            Wheelchair Mobility     Tilt Bed    Modified Rankin (Stroke Patients Only)       Balance Overall balance assessment: Modified Independent                                           Pertinent Vitals/Pain Pain Assessment Pain Assessment: 0-10 Pain Score: 8  Pain Location: R knee Pain Descriptors / Indicators: Sore Pain Intervention(s): Limited activity within patient's tolerance, Monitored during session, Patient requesting pain meds-RN notified, Ice applied    Home Living Family/patient expects to be discharged to:: Private residence Living Arrangements: Spouse/significant other Available Help at Discharge: Family;Available 24 hours/day Type of Home: House Home Access: Level entry     Alternate Level Stairs-Number of Steps: 16 Home Layout: Two level Home Equipment: Agricultural consultant (2 wheels)      Prior Function Prior Level of Function : Independent/Modified Independent             Mobility Comments: walked without AD, worked as a Water engineer ADLs Comments: independent  Hand Dominance   Dominant Hand: Right    Extremity/Trunk Assessment   Upper Extremity Assessment Upper Extremity Assessment: Overall WFL for tasks assessed    Lower Extremity Assessment Lower Extremity Assessment: RLE deficits/detail RLE Deficits / Details: SLR 2/5, knee ext -3/5 RLE Sensation: WNL RLE Coordination: WNL    Cervical / Trunk Assessment Cervical / Trunk Assessment: Normal  Communication   Communication: No  difficulties  Cognition Arousal/Alertness: Awake/alert Behavior During Therapy: WFL for tasks assessed/performed Overall Cognitive Status: Within Functional Limits for tasks assessed                                          General Comments      Exercises Total Joint Exercises Ankle Circles/Pumps: AROM, Both, 10 reps, Supine   Assessment/Plan    PT Assessment Patient needs continued PT services  PT Problem List Decreased strength;Decreased range of motion;Decreased activity tolerance;Decreased balance;Decreased mobility;Pain       PT Treatment Interventions Gait training;Therapeutic exercise;Balance training;Stair training;Functional mobility training;Therapeutic activities    PT Goals (Current goals can be found in the Care Plan section)  Acute Rehab 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: With patient Time For Goal Achievement: 03/12/23 Potential to Achieve Goals: Good    Frequency 7X/week     Co-evaluation               AM-PAC PT "6 Clicks" Mobility  Outcome Measure Help needed turning from your back to your side while in a flat bed without using bedrails?: A Little Help needed moving from lying on your back to sitting on the side of a flat bed without using bedrails?: A Little Help needed moving to and from a bed to a chair (including a wheelchair)?: A Little Help needed standing up from a chair using your arms (e.g., wheelchair or bedside chair)?: A Little Help needed to walk in hospital room?: A Little Help needed climbing 3-5 steps with a railing? : A Lot 6 Click Score: 17    End of Session Equipment Utilized During Treatment: Gait belt Activity Tolerance: Treatment limited secondary to medical complications (Comment);Patient limited by pain (dizziness) Patient left: in chair;with chair alarm set;with call bell/phone within reach Nurse Communication: Mobility status PT Visit Diagnosis: Other  abnormalities of gait and mobility (R26.89);Pain;Difficulty in walking, not elsewhere classified (R26.2);Muscle weakness (generalized) (M62.81) Pain - Right/Left: Right Pain - part of body: Knee    Time: 0981-1914 PT Time Calculation (min) (ACUTE ONLY): 15 min   Charges:   PT Evaluation $PT Eval Moderate Complexity: 1 Mod   PT General Charges $$ ACUTE PT VISIT: 1 Visit         Tamala Ser PT 03/05/2023  Acute Rehabilitation Services  Office [phone]

## 2023-03-05 NOTE — Interval H&P Note (Signed)
History and Physical Interval Note:  03/05/2023 6:29 AM  Tina Neal  has presented today for surgery, with the diagnosis of Failed right total knee arthroplasty.  The various methods of treatment have been discussed with the patient and family. After consideration of risks, benefits and other options for treatment, the patient has consented to  Procedure(s): Right knee femoral versus total knee arthroplasty revision (Right) as a surgical intervention.  The patient's history has been reviewed, patient examined, no change in status, stable for surgery.  I have reviewed the patient's chart and labs.  Questions were answered to the patient's satisfaction.     Homero Fellers Danyl Deems

## 2023-03-06 ENCOUNTER — Encounter (HOSPITAL_COMMUNITY): Payer: Self-pay | Admitting: Orthopedic Surgery

## 2023-03-06 LAB — GLUCOSE, CAPILLARY
Glucose-Capillary: 101 mg/dL — ABNORMAL HIGH (ref 70–99)
Glucose-Capillary: 90 mg/dL (ref 70–99)
Glucose-Capillary: 85 mg/dL (ref 70–99)
Glucose-Capillary: 54 mg/dL — ABNORMAL LOW (ref 70–99)
Glucose-Capillary: 66 mg/dL — ABNORMAL LOW (ref 70–99)
Glucose-Capillary: 73 mg/dL (ref 70–99)

## 2023-03-06 NOTE — Progress Notes (Signed)
Physical Therapy Treatment Patient Details Name: Tina Neal MRN: [id number] DOB: 23-Apr-1951 Today's Date: 03/06/2023   History of Present Illness 72 y.o. female admitted for R patellar and femoral knee revision. PMH: L2-L4 lumbar fusion January 2023, OA, DM, HLD, HTN, pancreatitis, COPD.    PT Comments  Pt is progressing well with mobility, she ambulated 120' with RW, with one episode of loss of balance due to LLE (non surgical leg) buckling. Pt reports her L knee has buckled in the past and that she's had a L TKA. Pt did not fall but required mod assist to avoid falling. Reviewed TKA HEP. Good progress expected.     If plan is discharge home, recommend the following: A little help with walking and/or transfers;A little help with bathing/dressing/bathroom;Assistance with cooking/housework;Assist for transportation;Help with stairs or ramp for entrance   Can travel by private vehicle        Equipment Recommendations  None recommended by PT    Recommendations for Other Services       Precautions / Restrictions Precautions Precautions: Knee;Fall Precaution Booklet Issued: Yes (comment) Precaution Comments: reviewed no pillow under knee Restrictions Weight Bearing Restrictions: No Other Position/Activity Restrictions: WBAT     Mobility  Bed Mobility Overal bed mobility: Modified Independent Bed Mobility: Sit to Supine     Supine to sit: HOB elevated, Modified independent (Device/Increase time) Sit to supine: Min assist   General bed mobility comments: assist for BLEs into bed    Transfers Overall transfer level: Needs assistance Equipment used: Rolling walker (2 wheels) Transfers: Sit to/from Stand Sit to Stand: From elevated surface, Min guard           General transfer comment: VCs hand placement    Ambulation/Gait Ambulation/Gait assistance: Min guard Gait Distance (Feet): 120 Feet Assistive device: Rolling walker (2 wheels) Gait Pattern/deviations:  Step-to pattern, Decreased step length - right, Decreased step length - left Gait velocity: decr     General Gait Details: Pt had one episode of loss of balance due to LLE (non surgical leg) buckling, pt reports her LLE has buckled in the past, LLE has a prior TKA   Stairs             Wheelchair Mobility     Tilt Bed    Modified Rankin (Stroke Patients Only)       Balance Overall balance assessment: Modified Independent                                          Cognition Arousal/Alertness: Awake/alert Behavior During Therapy: WFL for tasks assessed/performed Overall Cognitive Status: Within Functional Limits for tasks assessed                                          Exercises Total Joint Exercises Ankle Circles/Pumps: AROM, Both, 10 reps, Supine Quad Sets: AROM, Both, 5 reps, Supine Short Arc Quad: AROM, Right, 5 reps, Supine Heel Slides: AAROM, Right, 5 reps, Supine Hip ABduction/ADduction: AAROM, Right, 5 reps, Supine Knee Flexion: AAROM, Right, 10 reps, Supine Goniometric ROM: 5-55* R knee AAROM    General Comments        Pertinent Vitals/Pain Pain Assessment Pain Score: 4  Pain Location: R knee Pain Descriptors / Indicators: Sore Pain Intervention(s): Limited activity within patient's tolerance,  Monitored during session, RN gave pain meds during session, Patient requesting pain meds-RN notified, Premedicated before session, Ice applied    Home Living                          Prior Function            PT Goals (current goals can now be found in the care plan section) Acute Rehab 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: With patient Time For Goal Achievement: 03/12/23 Potential to Achieve Goals: Good Progress towards PT goals: Progressing toward goals    Frequency    7X/week      PT Plan Current plan remains appropriate    Co-evaluation               AM-PAC PT "6 Clicks" Mobility   Outcome Measure  Help needed turning from your back to your side while in a flat bed without using bedrails?: A Little Help needed moving from lying on your back to sitting on the side of a flat bed without using bedrails?: A Little Help needed moving to and from a bed to a chair (including a wheelchair)?: A Little Help needed standing up from a chair using your arms (e.g., wheelchair or bedside chair)?: A Little Help needed to walk in hospital room?: A Little Help needed climbing 3-5 steps with a railing? : A Lot 6 Click Score: 17    End of Session Equipment Utilized During Treatment: Gait belt Activity Tolerance: Patient tolerated treatment well (dizziness) Patient left: in chair;with chair alarm set;with call bell/phone within reach Nurse Communication: Mobility status PT Visit Diagnosis: Other abnormalities of gait and mobility (R26.89);Pain;Difficulty in walking, not elsewhere classified (R26.2);Muscle weakness (generalized) (M62.81) Pain - Right/Left: Right Pain - part of body: Knee     Time: 1610-9604 PT Time Calculation (min) (ACUTE ONLY): 28 min  Charges:    $Gait Training: 8-22 mins $Therapeutic Exercise: 8-22 mins PT General Charges $$ ACUTE PT VISIT: 1 Visit                     Tamala Ser PT 03/06/2023  Acute Rehabilitation Services  Office [phone]

## 2023-03-06 NOTE — TOC Transition Note (Signed)
Transition of Care [facility]) - CM/SW Discharge Note   Patient Details  Name: Tina Neal MRN: [id number] Date of Birth: 04-03-1951  Transition of Care [facility]) CM/SW Contact:  Amada Jupiter, LCSW Phone Number: 03/06/2023, 10:27 AM   Clinical Narrative:     Met with pt who confirms she has needed DME in the home. OPPT already arranged with [facility].  No further TOC needs.  Final next level of care: OP Rehab Barriers to Discharge: No Barriers Identified   Patient Goals and CMS Choice      Discharge Placement                         Discharge Plan and Services Additional resources added to the After Visit Summary for                  DME Arranged: N/A DME Agency: NA                  Social Determinants of Health (SDOH) Interventions SDOH Screenings   Food Insecurity: No Food Insecurity (03/05/2023)  Housing: Low Risk  (03/05/2023)  Transportation Needs: No Transportation Needs (03/05/2023)  Utilities: Not At Risk (03/05/2023)  Financial Resource Strain: Low Risk  (05/31/2022)  Social Connections: Unknown (12/04/2021)   Received from [facility]  Tobacco Use: Medium Risk (03/05/2023)     Readmission Risk Interventions    03/06/2023   10:27 AM  Readmission Risk Prevention Plan  Post Dischage Appt Complete  Medication Screening Complete  Transportation Screening Complete

## 2023-03-06 NOTE — Progress Notes (Signed)
Physical Therapy Treatment Patient Details Name: Tina Neal MRN: [id number] DOB: 1951-07-02 Today's Date: 03/06/2023   History of Present Illness 72 y.o. female admitted for R patellar and femoral knee revision. PMH: L2-L4 lumbar fusion January 2023, OA, DM, HLD, HTN, pancreatitis, COPD.    PT Comments  Pt is progressing well with mobility, she ambulated 53' with RW no loss of balance. Initiated TKA HEP. Good progress expected.     If plan is discharge home, recommend the following: A little help with walking and/or transfers;A little help with bathing/dressing/bathroom;Assistance with cooking/housework;Assist for transportation;Help with stairs or ramp for entrance   Can travel by private vehicle        Equipment Recommendations  None recommended by PT    Recommendations for Other Services       Precautions / Restrictions Precautions Precautions: Knee;Fall Precaution Booklet Issued: Yes (comment) Precaution Comments: reviewed no pillow under knee Restrictions Weight Bearing Restrictions: No RLE Weight Bearing: Weight bearing as tolerated Other Position/Activity Restrictions: WBAT     Mobility  Bed Mobility Overal bed mobility: Modified Independent Bed Mobility: Supine to Sit     Supine to sit: HOB elevated, Modified independent (Device/Increase time)     General bed mobility comments: HOB up, increased time    Transfers Overall transfer level: Needs assistance Equipment used: Rolling walker (2 wheels) Transfers: Sit to/from Stand Sit to Stand: From elevated surface, Min guard           General transfer comment: VCs hand placement    Ambulation/Gait Ambulation/Gait assistance: Min guard Gait Distance (Feet): 80 Feet Assistive device: Rolling walker (2 wheels) Gait Pattern/deviations: Step-to pattern, Decreased step length - right, Decreased step length - left Gait velocity: decr     General Gait Details: VCs sequencing   Stairs              Wheelchair Mobility     Tilt Bed    Modified Rankin (Stroke Patients Only)       Balance Overall balance assessment: Modified Independent                                          Cognition Arousal/Alertness: Awake/alert Behavior During Therapy: WFL for tasks assessed/performed Overall Cognitive Status: Within Functional Limits for tasks assessed                                          Exercises Total Joint Exercises Ankle Circles/Pumps: AROM, Both, 10 reps, Supine Quad Sets: AROM, Both, 5 reps, Supine Short Arc Quad: AROM, Right, 5 reps, Supine Heel Slides: AAROM, Right, 5 reps, Supine Hip ABduction/ADduction: AAROM, Right, 5 reps, Supine Knee Flexion: AAROM, Right, 10 reps, Supine Goniometric ROM: 5-55* R knee AAROM    General Comments        Pertinent Vitals/Pain Pain Assessment Pain Score: 5  Pain Location: R knee Pain Descriptors / Indicators: Sore Pain Intervention(s): Limited activity within patient's tolerance, Monitored during session, Ice applied, Premedicated before session    Home Living                          Prior Function            PT Goals (current goals can now be found in the care  plan section) Acute Rehab 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: With patient Time For Goal Achievement: 03/12/23 Potential to Achieve Goals: Good Progress towards PT goals: Progressing toward goals    Frequency    7X/week      PT Plan      Co-evaluation              AM-PAC PT "6 Clicks" Mobility   Outcome Measure  Help needed turning from your back to your side while in a flat bed without using bedrails?: A Little Help needed moving from lying on your back to sitting on the side of a flat bed without using bedrails?: A Little Help needed moving to and from a bed to a chair (including a wheelchair)?: A Little Help needed standing up from a  chair using your arms (e.g., wheelchair or bedside chair)?: A Little Help needed to walk in hospital room?: A Little Help needed climbing 3-5 steps with a railing? : A Lot 6 Click Score: 17    End of Session Equipment Utilized During Treatment: Gait belt Activity Tolerance: Patient tolerated treatment well (dizziness) Patient left: in chair;with chair alarm set;with call bell/phone within reach Nurse Communication: Mobility status PT Visit Diagnosis: Other abnormalities of gait and mobility (R26.89);Pain;Difficulty in walking, not elsewhere classified (R26.2);Muscle weakness (generalized) (M62.81) Pain - Right/Left: Right Pain - part of body: Knee     Time: 0913-0949 PT Time Calculation (min) (ACUTE ONLY): 36 min  Charges:    $Gait Training: 8-22 mins $Therapeutic Exercise: 8-22 mins PT General Charges $$ ACUTE PT VISIT: 1 Visit                    Tamala Ser PT 03/06/2023  Acute Rehabilitation Services  Office [phone]

## 2023-03-06 NOTE — Plan of Care (Signed)
  Problem: Coping: Goal: Ability to adjust to condition or change in health will improve Outcome: Progressing   Problem: Pain Management: Goal: Pain level will decrease with appropriate interventions Outcome: Progressing   

## 2023-03-06 NOTE — Progress Notes (Signed)
   Subjective: 1 Day Post-Op Procedure(s) (LRB): Right knee femoral arthroplasty revision (Right) Patient reports pain as mild.   Patient seen in rounds by Dr. Lequita Halt. Patient is well, and has had no acute complaints or problems No issues overnight. Denies chest pain, SOB, or calf pain. Foley catheter removed this AM.  We will continue therapy today, ambulated 15' yesterday.   Objective: Vital signs in last 24 hours: Temp:  [97.4 F (36.3 C)-98 F (36.7 C)] 98 F (36.7 C) (08/01 0639) Pulse Rate:  [74-96] 80 (08/01 0639) Resp:  [10-19] 15 (08/01 0639) BP: (103-149)/(64-87) 108/69 (08/01 0639) SpO2:  [95 %-100 %] 99 % (08/01 0639)  Intake/Output from previous day:  Intake/Output Summary (Last 24 hours) at 03/06/2023 0742 Last data filed at 03/06/2023 0700 Gross per 24 hour  Intake 3444.96 ml  Output 2500 ml  Net 944.96 ml     Intake/Output this shift: No intake/output data recorded.  Labs: Recent Labs    03/06/23 0348  HGB 10.2*   Recent Labs    03/06/23 0348  WBC 9.0  RBC 3.85*  HCT 32.7*  PLT 230   Recent Labs    03/06/23 0348  NA 140  K 3.7  CL 105  CO2 24  BUN 8  CREATININE 0.75  GLUCOSE 119*  CALCIUM 8.3*   No results for input(s): "LABPT", "INR" in the last 72 hours.  Exam: General - Patient is Alert and Oriented Extremity - Neurologically intact Neurovascular intact Sensation intact distally Dorsiflexion/Plantar flexion intact Dressing - dressing C/D/I Motor Function - intact, moving foot and toes well on exam.   Past Medical History:  Diagnosis Date   Arthritis    Asthma    Diabetes mellitus without complication (HCC)    GERD (gastroesophageal reflux disease)    Hyperlipidemia    Hypertension    Hypothyroidism    Pancreatitis    history    Assessment/Plan: 1 Day Post-Op Procedure(s) (LRB): Right knee femoral arthroplasty revision (Right) Principal Problem:   Failed total knee arthroplasty (HCC) Active Problems:   Failed  total right knee replacement (HCC)  Estimated body mass index is 33.3 kg/m as calculated from the following:   Height as of this encounter: 5' 2.5" (1.588 m).   Weight as of this encounter: 83.9 kg. Advance diet Up with therapy D/C IV fluids  DVT Prophylaxis - Aspirin Weight bearing as tolerated. Continue therapy.  Plan is to go Home after hospital stay. Plan for discharge tomorrow if meeting goals with therapy  Arther Abbott, PA-C Orthopedic Surgery [phone] 03/06/2023, 7:42 AM

## 2023-03-07 ENCOUNTER — Other Ambulatory Visit (HOSPITAL_COMMUNITY): Payer: Self-pay

## 2023-03-07 LAB — GLUCOSE, CAPILLARY
Glucose-Capillary: 142 mg/dL — ABNORMAL HIGH (ref 70–99)
Glucose-Capillary: 84 mg/dL (ref 70–99)

## 2023-03-07 MED ORDER — ONDANSETRON HCL 4 MG PO TABS
4.0000 mg | ORAL_TABLET | Freq: Four times a day (QID) | ORAL | 0 refills | Status: AC | PRN
  Filled 2023-03-07: qty 20, 5d supply, fill #0

## 2023-03-07 MED ORDER — GABAPENTIN 300 MG PO CAPS
ORAL_CAPSULE | ORAL | 0 refills | Status: AC
Start: 2023-03-07 — End: ?
  Filled 2023-03-07 (×2): qty 84, 30d supply, fill #0

## 2023-03-07 MED ORDER — METHOCARBAMOL 500 MG PO TABS
500.0000 mg | ORAL_TABLET | Freq: Four times a day (QID) | ORAL | 0 refills | Status: AC | PRN
  Filled 2023-03-07: qty 40, 10d supply, fill #0

## 2023-03-07 MED ORDER — OXYCODONE HCL 5 MG PO TABS
5.0000 mg | ORAL_TABLET | Freq: Four times a day (QID) | ORAL | 0 refills | Status: AC | PRN
  Filled 2023-03-07: qty 42, 6d supply, fill #0

## 2023-03-07 MED ORDER — ASPIRIN 81 MG PO CHEW
81.0000 mg | CHEWABLE_TABLET | Freq: Two times a day (BID) | ORAL | 0 refills | Status: AC
  Filled 2023-03-07: qty 63, 32d supply, fill #0

## 2023-03-07 MED ORDER — TRAMADOL HCL 50 MG PO TABS
50.0000 mg | ORAL_TABLET | Freq: Four times a day (QID) | ORAL | 0 refills | Status: AC | PRN
  Filled 2023-03-07: qty 40, 5d supply, fill #0

## 2023-03-07 NOTE — Progress Notes (Signed)
Physical Therapy Treatment Patient Details Name: Tina Neal MRN: [id number] DOB: [birth year] Today's Date: 03/07/2023   History of Present Illness 72 y.o. female admitted for R patellar and femoral knee revision. PMH: L2-L4 lumbar fusion January 2023, OA, DM, HLD, HTN, pancreatitis, COPD.    PT Comments  Pt ambulated 120' with RW, no buckling nor loss of balance. Reviewed TKA HEP, pt unable to tolerate flexion more than ~40* in R knee with ROM exercises. Initiated stair training, will review with pt's spouse when he arrives this afternoon. Then I expect pt will be ready to DC home from a mobility standpoint. As pt's LLE (non surgical leg) buckled yesterday, pt was advised to maintain both hands on RW at all times and to report and further buckling to her orthopedic Dr.     If plan is discharge home, recommend the following: A little help with walking and/or transfers;A little help with bathing/dressing/bathroom;Assistance with cooking/housework;Assist for transportation;Help with stairs or ramp for entrance   Can travel by private vehicle        Equipment Recommendations  None recommended by PT    Recommendations for Other Services       Precautions / Restrictions Precautions Precautions: Knee;Fall Precaution Booklet Issued: Yes (comment) Precaution Comments: reviewed no pillow under knee Restrictions Weight Bearing Restrictions: No RLE Weight Bearing: Weight bearing as tolerated Other Position/Activity Restrictions: WBAT     Mobility  Bed Mobility               General bed mobility comments: up at edge of bed    Transfers Overall transfer level: Needs assistance Equipment used: Rolling walker (2 wheels) Transfers: Sit to/from Stand Sit to Stand: From elevated surface, Supervision           General transfer comment: VCs hand placement    Ambulation/Gait Ambulation/Gait assistance: Min guard Gait Distance (Feet): 120 Feet Assistive device: Rolling walker  (2 wheels) Gait Pattern/deviations: Step-to pattern, Decreased step length - right, Decreased step length - left Gait velocity: decr     General Gait Details: no buckling nor loss of balance, distance limited by pain/fatigue in RLE   Stairs Stairs: Yes Stairs assistance: Min assist Stair Management: No rails, With walker, Backwards, Step to pattern Number of Stairs: 1 General stair comments: VCs sequencing, min A to steady RW   Wheelchair Mobility     Tilt Bed    Modified Rankin (Stroke Patients Only)       Balance Overall balance assessment: Modified Independent                                          Cognition Arousal/Alertness: Awake/alert Behavior During Therapy: WFL for tasks assessed/performed Overall Cognitive Status: Within Functional Limits for tasks assessed                                          Exercises Total Joint Exercises Ankle Circles/Pumps: AROM, Both, 10 reps, Supine Quad Sets: AROM, Both, 5 reps, Supine Short Arc Quad: Limitations Short Arc Quad Limitations: unable to tolerate pillow under knee 2* pain Heel Slides: AAROM, Right, Supine, 10 reps Knee Flexion: AAROM, Right, 10 reps, Seated    General Comments        Pertinent Vitals/Pain Pain Assessment Pain Score: 5  Pain Location:  R knee Pain Descriptors / Indicators: Sore Pain Intervention(s): Limited activity within patient's tolerance, Monitored during session, Premedicated before session, Patient requesting pain meds-RN notified, RN gave pain meds during session, Ice applied    Home Living                          Prior Function            PT Goals (current goals can now be found in the care plan section) Acute Rehab 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: With patient Time For Goal Achievement: 03/12/23 Potential to Achieve Goals: Good Progress towards PT goals: Progressing toward  goals    Frequency    7X/week      PT Plan Current plan remains appropriate    Co-evaluation              AM-PAC PT "6 Clicks" Mobility   Outcome Measure  Help needed turning from your back to your side while in a flat bed without using bedrails?: A Little Help needed moving from lying on your back to sitting on the side of a flat bed without using bedrails?: A Little Help needed moving to and from a bed to a chair (including a wheelchair)?: A Little Help needed standing up from a chair using your arms (e.g., wheelchair or bedside chair)?: A Little Help needed to walk in hospital room?: A Little Help needed climbing 3-5 steps with a railing? : A Little 6 Click Score: 18    End of Session Equipment Utilized During Treatment: Gait belt Activity Tolerance: Patient tolerated treatment well Patient left: in chair;with chair alarm set;with call bell/phone within reach;with nursing/sitter in room Nurse Communication: Mobility status PT Visit Diagnosis: Other abnormalities of gait and mobility (R26.89);Pain;Difficulty in walking, not elsewhere classified (R26.2);Muscle weakness (generalized) (M62.81) Pain - Right/Left: Right Pain - part of body: Knee     Time: 1914-7829 PT Time Calculation (min) (ACUTE ONLY): 25 min  Charges:    $Gait Training: 8-22 mins $Therapeutic Exercise: 8-22 mins PT General Charges $$ ACUTE PT VISIT: 1 Visit                     Tamala Ser PT 03/07/2023  Acute Rehabilitation Services  Office [phone]

## 2023-03-07 NOTE — Plan of Care (Signed)
Patient discharging home via private vehicle with husband. Haydee Salter, RN 03/07/23 1:23 PM

## 2023-03-07 NOTE — Plan of Care (Signed)
Problem: Education: Goal: Ability to describe self-care measures that may prevent or decrease complications (Diabetes Survival Skills Education) will improve Outcome: Progressing   Problem: Coping: Goal: Ability to adjust to condition or change in health will improve Outcome: Progressing   Problem: Health Behavior/Discharge Planning: Goal: Ability to identify and utilize available resources and services will improve Outcome: Progressing   Problem: Activity: Goal: Ability to avoid complications of mobility impairment will improve Outcome: Progressing   Problem: Pain Management: Goal: Pain level will decrease with appropriate interventions Outcome: Progressing   Haydee Salter, RN 03/07/23 7:48 AM

## 2023-03-07 NOTE — Progress Notes (Signed)
   Subjective: 2 Days Post-Op Procedure(s) (LRB): Right knee femoral arthroplasty revision (Right) Patient reports pain as moderate.   Patient seen in rounds for Dr. Lequita Halt. Patient is doing well other than pain in the right knee, states pain meds are working well. No issues overnight.  Plan is to go Home after hospital stay.  Objective: Vital signs in last 24 hours: Temp:  [97.8 F (36.6 C)-98.5 F (36.9 C)] 98.1 F (36.7 C) (08/02 0621) Pulse Rate:  [86-94] 94 (08/01 2150) Resp:  [14-18] 17 (08/02 0621) BP: (114-136)/(63-105) 125/83 (08/02 0621) SpO2:  [93 %-98 %] 96 % (08/02 0621)  Intake/Output from previous day:  Intake/Output Summary (Last 24 hours) at 03/07/2023 0759 Last data filed at 03/07/2023 0736 Gross per 24 hour  Intake 2689.93 ml  Output 1900 ml  Net 789.93 ml    Intake/Output this shift: Total I/O In: 0  Out: 700 [Urine:700]  Labs: Recent Labs    03/06/23 0348 03/07/23 0344  HGB 10.2* 10.1*   Recent Labs    03/06/23 0348 03/07/23 0344  WBC 9.0 9.2  RBC 3.85* 3.84*  HCT 32.7* 32.2*  PLT 230 265   Recent Labs    03/06/23 0348  NA 140  K 3.7  CL 105  CO2 24  BUN 8  CREATININE 0.75  GLUCOSE 119*  CALCIUM 8.3*   No results for input(s): "LABPT", "INR" in the last 72 hours.  Exam: General - Patient is Alert and Oriented Extremity - Neurologically intact Neurovascular intact Sensation intact distally Dorsiflexion/Plantar flexion intact Dressing/Incision - clean, dry, intact Motor Function - intact, moving foot and toes well on exam.   Past Medical History:  Diagnosis Date   Arthritis    Asthma    Diabetes mellitus without complication (HCC)    GERD (gastroesophageal reflux disease)    Hyperlipidemia    Hypertension    Hypothyroidism    Pancreatitis    history    Assessment/Plan: 2 Days Post-Op Procedure(s) (LRB): Right knee femoral arthroplasty revision (Right) Principal Problem:   Failed total knee arthroplasty  (HCC) Active Problems:   Failed total right knee replacement (HCC)  Estimated body mass index is 33.3 kg/m as calculated from the following:   Height as of this encounter: 5' 2.5" (1.588 m).   Weight as of this encounter: 83.9 kg. Up with therapy  DVT Prophylaxis - Aspirin Weight-bearing as tolerated  Plan for discharge to home today with OPPT F/u in 2 weeks in our office  The PDMP database was reviewed today prior to any opioid medications being prescribed to this patient.   Arther Abbott, PA-C Orthopedic Surgery [phone] 03/07/2023, 7:59 AM

## 2023-03-10 DIAGNOSIS — M25561 Pain in right knee: Secondary | ICD-10-CM | POA: Diagnosis not present

## 2023-03-12 DIAGNOSIS — M25561 Pain in right knee: Secondary | ICD-10-CM | POA: Diagnosis not present

## 2023-03-17 NOTE — Discharge Summary (Signed)
Patient ID: Tina Neal MRN: [id number] DOB/AGE: February 20, 1951 72 y.o.  Admit date: 03/05/2023 Discharge date: 03/07/2023  Admission Diagnoses:  Principal Problem:   Failed total knee arthroplasty [facility]) Active Problems:   Failed total right knee replacement [facility])   Discharge Diagnoses:  Same  Past Medical History:  Diagnosis Date   Arthritis    Asthma    Diabetes mellitus without complication (HCC)    GERD (gastroesophageal reflux disease)    Hyperlipidemia    Hypertension    Hypothyroidism    Pancreatitis    history    Surgeries: Procedure(s): Right knee femoral arthroplasty revision on 03/05/2023   Consultants:   Discharged Condition: Improved  Hospital Course: Tina Neal is an 72 y.o. female who was admitted 03/05/2023 for operative treatment ofFailed total knee arthroplasty (HCC). Patient has severe unremitting pain that affects sleep, daily activities, and work/hobbies. After pre-op clearance the patient was taken to the operating room on 03/05/2023 and underwent  Procedure(s): Right knee femoral arthroplasty revision.    Patient was given perioperative antibiotics:  Anti-infectives (From admission, onward)    Start     Dose/Rate Route Frequency Ordered Stop   03/05/23 1400  ceFAZolin (ANCEF) IVPB 2g/100 mL premix        2 g 200 mL/hr over 30 Minutes Intravenous Every 6 hours 03/05/23 1114 03/05/23 2111   03/05/23 0600  ceFAZolin (ANCEF) IVPB 2g/100 mL premix        2 g 200 mL/hr over 30 Minutes Intravenous On call to O.R. 03/05/23 9528 03/05/23 0747        Patient was given sequential compression devices, early ambulation, and chemoprophylaxis to prevent DVT.  Patient benefited maximally from hospital stay and there were no complications.    Recent vital signs: No data found.   Recent laboratory studies: No results for input(s): "WBC", "HGB", "HCT", "PLT", "NA", "K", "CL", "CO2", "BUN", "CREATININE", "GLUCOSE", "INR", "CALCIUM" in the last 72  hours.  Invalid input(s): "PT", "2"   Discharge Medications:   Allergies as of 03/07/2023       Reactions   Maxidex [dexamethasone] Photosensitivity   Hctz [hydrochlorothiazide] Rash        Medication List     TAKE these medications    Aspirin Low Dose 81 MG chewable tablet Generic drug: aspirin Chew 1 tablet (81 mg total) by mouth 2 (two) times daily for 20 days. Then take one 81 mg aspirin once a day for three weeks. Then discontinue aspirin.   atorvastatin 10 MG tablet Commonly known as: LIPITOR Take 10 mg by mouth daily.   budesonide-formoterol 160-4.5 MCG/ACT inhaler Commonly known as: SYMBICORT Inhale 2 puffs into the lungs 2 (two) times daily as needed (Shortness of breath).   CALCIUM PO Take 600 mg by mouth daily.   cholecalciferol 25 MCG (1000 UNIT) tablet Commonly known as: VITAMIN D3 Take 1,000 Units by mouth daily.   cyanocobalamin 1000 MCG tablet Commonly known as: VITAMIN B12 Take 1,000 mcg by mouth daily.   gabapentin 300 MG capsule Commonly known as: NEURONTIN Take a 300 mg capsule three times a day for two weeks following surgery.Then take a 300 mg capsule two times a day for two weeks. Then take a 300 mg capsule once a day for two weeks. Then discontinue.   ICY HOT EX Apply 1 application  topically daily at 12 noon. Gel   levothyroxine 75 MCG tablet Commonly known as: SYNTHROID Take 75 mcg by mouth daily before breakfast.   metFORMIN 500  MG tablet Commonly known as: GLUCOPHAGE Take 250 mg by mouth daily.   methocarbamol 500 MG tablet Commonly known as: ROBAXIN Take 1 tablet (500 mg total) by mouth every 6 (six) hours as needed for muscle spasms.   montelukast 10 MG tablet Commonly known as: SINGULAIR Take 10 mg by mouth daily.   ondansetron 4 MG tablet Commonly known as: ZOFRAN Take 1 tablet (4 mg total) by mouth every 6 (six) hours as needed for nausea.   oxyCODONE 5 MG immediate release tablet Commonly known as: Oxy  IR/ROXICODONE Take 1-2 tablets (5-10 mg total) by mouth every 6 (six) hours as needed for severe pain.   Ozempic (0.25 or 0.5 MG/DOSE) 2 MG/3ML Sopn Generic drug: Semaglutide(0.25 or 0.5MG /DOS) Inject 0.5 mg as directed every Sunday.   pantoprazole 40 MG tablet Commonly known as: PROTONIX Take 40 mg by mouth daily.   spironolactone 25 MG tablet Commonly known as: ALDACTONE Take 25 mg by mouth 2 (two) times daily.   traMADol 50 MG tablet Commonly known as: ULTRAM Take 1-2 tablets (50-100 mg total) by mouth every 6 (six) hours as needed for moderate pain. What changed:  how much to take when to take this reasons to take this               Discharge Care Instructions  (From admission, onward)           Start     Ordered   03/07/23 0000  Weight bearing as tolerated        08 /02/24 0803   03/07/23 0000  Change dressing       Comments: You may remove the bulky bandage (ACE wrap and gauze) two days after surgery. You will have an adhesive waterproof bandage underneath. Leave this in place until your first follow-up appointment.   03/07/23 0803            Diagnostic Studies: No results found.  Disposition: Discharge disposition: 01-Home or Self Care       Discharge Instructions     Call MD / Call 911   Complete by: As directed    If you experience chest pain or shortness of breath, CALL 911 and be transported to the hospital emergency room.  If you develope a fever above 101 F, pus (white drainage) or increased drainage or redness at the wound, or calf pain, call your surgeon's office.   Change dressing   Complete by: As directed    You may remove the bulky bandage (ACE wrap and gauze) two days after surgery. You will have an adhesive waterproof bandage underneath. Leave this in place until your first follow-up appointment.   Constipation Prevention   Complete by: As directed    Drink plenty of fluids.  Prune juice may be helpful.  You may use a stool  softener, such as Colace (over the counter) 100 mg twice a day.  Use MiraLax (over the counter) for constipation as needed.   Diet - low sodium heart healthy   Complete by: As directed    Do not put a pillow under the knee. Place it under the heel.   Complete by: As directed    Driving restrictions   Complete by: As directed    No driving for two weeks   Post-operative opioid taper instructions:   Complete by: As directed    POST-OPERATIVE OPIOID TAPER INSTRUCTIONS: It is important to wean off of your opioid medication as soon as possible. If you do not need  pain medication after your surgery it is ok to stop day one. Opioids include: Codeine, Hydrocodone(Norco, Vicodin), Oxycodone(Percocet, oxycontin) and hydromorphone amongst others.  Long term and even short term use of opiods can cause: Increased pain response Dependence Constipation Depression Respiratory depression And more.  Withdrawal symptoms can include Flu like symptoms Nausea, vomiting And more Techniques to manage these symptoms Hydrate well Eat regular healthy meals Stay active Use relaxation techniques(deep breathing, meditating, yoga) Do Not substitute Alcohol to help with tapering If you have been on opioids for less than two weeks and do not have pain than it is ok to stop all together.  Plan to wean off of opioids This plan should start within one week post op of your joint replacement. Maintain the same interval or time between taking each dose and first decrease the dose.  Cut the total daily intake of opioids by one tablet each day Next start to increase the time between doses. The last dose that should be eliminated is the evening dose.      TED hose   Complete by: As directed    Use stockings (TED hose) for three weeks on both leg(s).  You may remove them at night for sleeping.   Weight bearing as tolerated   Complete by: As directed         Follow-up Information     Aluisio, Homero Fellers, MD.  Schedule an appointment as soon as possible for a visit in 2 week(s).   Specialty: Orthopedic Surgery Contact information: [address] [place] [address] [place] [address] [phone]                  Signed: Arther Abbott 03/17/2023, 8:24 AM

## 2023-03-18 DIAGNOSIS — M25561 Pain in right knee: Secondary | ICD-10-CM | POA: Diagnosis not present

## 2023-03-24 DIAGNOSIS — M25561 Pain in right knee: Secondary | ICD-10-CM | POA: Diagnosis not present

## 2023-03-26 DIAGNOSIS — M25561 Pain in right knee: Secondary | ICD-10-CM | POA: Diagnosis not present

## 2023-04-01 DIAGNOSIS — M25561 Pain in right knee: Secondary | ICD-10-CM | POA: Diagnosis not present

## 2023-04-03 DIAGNOSIS — M25561 Pain in right knee: Secondary | ICD-10-CM | POA: Diagnosis not present

## 2023-04-08 DIAGNOSIS — Z5189 Encounter for other specified aftercare: Secondary | ICD-10-CM | POA: Diagnosis not present

## 2023-04-25 DIAGNOSIS — J069 Acute upper respiratory infection, unspecified: Secondary | ICD-10-CM | POA: Diagnosis not present

## 2023-04-25 DIAGNOSIS — U071 COVID-19: Secondary | ICD-10-CM | POA: Diagnosis not present

## 2023-05-07 DIAGNOSIS — Z23 Encounter for immunization: Secondary | ICD-10-CM | POA: Diagnosis not present

## 2023-06-06 ENCOUNTER — Other Ambulatory Visit (HOSPITAL_COMMUNITY): Payer: Self-pay

## 2023-06-16 DIAGNOSIS — M5416 Radiculopathy, lumbar region: Secondary | ICD-10-CM | POA: Diagnosis not present

## 2023-06-16 DIAGNOSIS — Z981 Arthrodesis status: Secondary | ICD-10-CM | POA: Diagnosis not present

## 2023-06-16 DIAGNOSIS — M545 Low back pain, unspecified: Secondary | ICD-10-CM | POA: Diagnosis not present

## 2023-06-16 DIAGNOSIS — M961 Postlaminectomy syndrome, not elsewhere classified: Secondary | ICD-10-CM | POA: Diagnosis not present

## 2023-06-23 DIAGNOSIS — M76821 Posterior tibial tendinitis, right leg: Secondary | ICD-10-CM | POA: Diagnosis not present

## 2023-06-23 DIAGNOSIS — M79671 Pain in right foot: Secondary | ICD-10-CM | POA: Diagnosis not present

## 2023-06-25 DIAGNOSIS — Z1231 Encounter for screening mammogram for malignant neoplasm of breast: Secondary | ICD-10-CM | POA: Diagnosis not present

## 2023-06-30 DIAGNOSIS — E559 Vitamin D deficiency, unspecified: Secondary | ICD-10-CM | POA: Diagnosis not present

## 2023-06-30 DIAGNOSIS — E039 Hypothyroidism, unspecified: Secondary | ICD-10-CM | POA: Diagnosis not present

## 2023-06-30 DIAGNOSIS — J4520 Mild intermittent asthma, uncomplicated: Secondary | ICD-10-CM | POA: Diagnosis not present

## 2023-06-30 DIAGNOSIS — E1143 Type 2 diabetes mellitus with diabetic autonomic (poly)neuropathy: Secondary | ICD-10-CM | POA: Diagnosis not present

## 2023-06-30 DIAGNOSIS — J449 Chronic obstructive pulmonary disease, unspecified: Secondary | ICD-10-CM | POA: Diagnosis not present

## 2023-06-30 DIAGNOSIS — I10 Essential (primary) hypertension: Secondary | ICD-10-CM | POA: Diagnosis not present

## 2023-06-30 DIAGNOSIS — E782 Mixed hyperlipidemia: Secondary | ICD-10-CM | POA: Diagnosis not present

## 2023-06-30 DIAGNOSIS — K219 Gastro-esophageal reflux disease without esophagitis: Secondary | ICD-10-CM | POA: Diagnosis not present

## 2023-06-30 DIAGNOSIS — I700 Atherosclerosis of aorta: Secondary | ICD-10-CM | POA: Diagnosis not present

## 2023-07-18 ENCOUNTER — Ambulatory Visit: Payer: Medicare HMO | Admitting: Podiatry

## 2023-07-18 ENCOUNTER — Ambulatory Visit (INDEPENDENT_AMBULATORY_CARE_PROVIDER_SITE_OTHER): Payer: Medicare HMO

## 2023-07-18 ENCOUNTER — Other Ambulatory Visit: Payer: Self-pay | Admitting: Podiatry

## 2023-07-18 ENCOUNTER — Encounter: Payer: Self-pay | Admitting: Podiatry

## 2023-07-18 DIAGNOSIS — B351 Tinea unguium: Secondary | ICD-10-CM

## 2023-07-18 DIAGNOSIS — L02611 Cutaneous abscess of right foot: Secondary | ICD-10-CM

## 2023-07-18 DIAGNOSIS — L929 Granulomatous disorder of the skin and subcutaneous tissue, unspecified: Secondary | ICD-10-CM

## 2023-07-18 DIAGNOSIS — L03031 Cellulitis of right toe: Secondary | ICD-10-CM

## 2023-07-18 DIAGNOSIS — E1142 Type 2 diabetes mellitus with diabetic polyneuropathy: Secondary | ICD-10-CM | POA: Diagnosis not present

## 2023-07-18 DIAGNOSIS — M79675 Pain in left toe(s): Secondary | ICD-10-CM | POA: Diagnosis not present

## 2023-07-18 DIAGNOSIS — M79674 Pain in right toe(s): Secondary | ICD-10-CM | POA: Diagnosis not present

## 2023-07-18 LAB — UNLABELED: Test Ordered On Req: 18881

## 2023-07-18 MED ORDER — DOXYCYCLINE HYCLATE 100 MG PO TABS
100.0000 mg | ORAL_TABLET | Freq: Two times a day (BID) | ORAL | 0 refills | Status: AC
Start: 2023-07-18 — End: 2023-07-28

## 2023-07-18 NOTE — Patient Instructions (Signed)
Mix betadine iodine solution in a quart of warm tap water.  Color should look like Tea. Submerge your foot or feet in the solution and soak for 20 minutes.  This soak should be done twice a day.  Next, remove your foot or feet from solution, blot dry the affected area. Apply ointment and cover if instructed by your doctor.   IF YOUR SKIN BECOMES IRRITATED WHILE USING THESE INSTRUCTIONS, IT IS OKAY TO SWITCH TO  WHITE VINEGAR AND WATER.  As another alternative soak, you may use antibacterial soap and water.  Monitor for any signs/symptoms of infection. Call the office immediately if any occur or go directly to the emergency room. Call with any questions/concerns.

## 2023-07-20 NOTE — Progress Notes (Signed)
Chief Complaint  Patient presents with   Nail Problem    Pt presents for Diabetic foot care possible ingrown toenail. Pt denies pain.    HPI: 72 y.o. female presents today for diabetic foot evaluation.  She presents primarily for elongated, dystrophic, mycotic toenails that are painful with shoe gear due to the thickness and length.  She is unable to maintain them herself due to their dystrophic nature.  She states her last A1c was in the low 6's.  She denies any other known pedal complaints.  Denies any nausea, vomiting, fever, chills, chest pain, shortness of breath.  Past Medical History:  Diagnosis Date   Arthritis    Asthma    Diabetes mellitus without complication (HCC)    GERD (gastroesophageal reflux disease)    Hyperlipidemia    Hypertension    Hypothyroidism    Pancreatitis    history    Past Surgical History:  Procedure Laterality Date   ABDOMINAL HYSTERECTOMY     ANTERIOR LAT LUMBAR FUSION N/A 08/29/2021   Procedure: EXTREME LATERAL INTERBODY FUSION LUMBAR TWO THROUGH FOUR;  Surgeon: Venita Lick, MD;  Location: [facility];  Service: Orthopedics;  Laterality: N/A;  3.5 hrs left tap block with exparel 3 C-Bed   BACK SURGERY  2011   lumbar fusion   CALCANEAL OSTEOTOMY Left 10/28/2013   Procedure: LEFT CALCANEAL OSTEOTOMY;  Surgeon: Toni Arthurs, MD;  Location: [facility];  Service: Orthopedics;  Laterality: Left;   CHOLECYSTECTOMY  2009   GASTROC RECESSION EXTREMITY Left 10/28/2013   Procedure: LEFT GASTROCNEMIUS RECESSION ;  Surgeon: Toni Arthurs, MD;  Location: [facility];  Service: Orthopedics;  Laterality: Left;   JOINT REPLACEMENT     bilat total knees   KNEE SURGERY     right   rotator cuff     rt   TENDON TRANSFER Left 10/28/2013   Procedure: LEFT FLEXOR DIGITORUM LONGUS TRANSFER, POSTERIOR TIBIAL TENOLYSIS;  Surgeon: Toni Arthurs, MD;  Location: [facility];  Service: Orthopedics;  Laterality: Left;    THYROIDECTOMY     TOTAL KNEE REVISION Right 03/05/2023   Procedure: Right knee femoral arthroplasty revision;  Surgeon: Ollen Gross, MD;  Location: [facility];  Service: Orthopedics;  Laterality: Right;    Allergies  Allergen Reactions   Maxidex [Dexamethasone] Photosensitivity   Hctz [Hydrochlorothiazide] Rash    ROS negative except as stated in HPI   Physical Exam: There were no vitals filed for this visit.  General: The patient is alert and oriented x3 in no acute distress.  Dermatology: Pedal skin atrophic.  Interspaces are clear of maceration and debris.  Nails 1 through 5 bilaterally are notable for increased thickness, length, dystrophic appearance with yellow discoloration and subungual debris suggestive of mycotic infection.  Upon debridement there is serous yellow tinge drainage to the right hallux.  No associated redness or swelling appreciated.  Culture obtained of the drainage as described in the plan.  Total nail avulsion performed as described below.  Underlying granuloma noted.    Vascular: Palpable pedal pulses bilaterally. Capillary refill within normal limits.  No appreciable edema.  No erythema or calor.  Pedal hair growth absent.  Neurological: Light touch sensation grossly intact bilateral feet.  Protective sensation diminished present at 8/10 sites bilaterally.  Musculoskeletal Exam: Mild bilateral HAV deformities noted, small reducible hammertoe deformities noted.  Radiographic Exam: Right foot 07/18/2023 Examination limited due to overlying bandage causing possible artifact of the underlying soft  tissues of the right great toe, including to the level of the MPJ.  No acute fracture noted. Area of lucency appreciated in distal phalanx on AP view not seen on oblique or lateral views.  There is dorsal loose calcification to the distal phalanx, possibly representing old displaced fracture fragment. Possible irregularity of the distal aspect of the distal phalanx seen on  oblique view  Assessment/Plan of Care: 1. Paronychia of toe of right foot   2. Cellulitis and abscess of toe of right foot   3. Granuloma of great toe   4. Pain due to onychomycosis of toenails of both feet   5. Type 2 diabetes mellitus with diabetic polyneuropathy, without long-term current use of insulin (HCC)      Meds ordered this encounter  Medications   doxycycline (VIBRA-TABS) 100 MG tablet    Sig: Take 1 tablet (100 mg total) by mouth 2 (two) times daily for 10 days.    Dispense:  20 tablet    Refill:  0   None  Discussed clinical findings with patient today.  # Paronychia of right first toe with underlying granuloma -Drainage was appreciated upon debridement of the right hallux nail, wound culture obtained from this -Verbal and written consent obtained perform right hallux total nail avulsion.  Right hallux was anesthetized injecting 3 cc of a one-to-one ratio of 1% lidocaine plain 0.5% Marcaine plain after alcohol skin prep.  The affected nail plate was then freed from the eponychium using a Freer.  The nail was then removed in [place] using a hemostat.  Underlying granuloma was noted.  Toe was flushed with isopropyl alcohol.  Silver nitrate was applied to the granuloma.  Silvadene cream, Telfa, gauze, Coban dressing applied -Aftercare and soaking instructions discussed at length patient with written instructions provided -Radiographs obtained and discussed with patient.  No obvious signs of osteomyelitis given the patient's overall presentation.  Will monitor closely, consider serial radiographs at follow-up or advanced imaging if necessary -Starting patient on 10 days of oral doxycycline -Follow-up in 2 weeks for nail recheck  # Type 2 diabetes with neuropathy, onychomycosis -Remaining toenails x 9 were debrided in thickness and length using aseptic nail nippers. -Affected toenails were filed thin using mechanical bur - Patient educated on diabetes. Discussed proper diabetic  foot care and discussed risks and complications of disease. Educated patient in depth on reasons to return to the office immediately should she discover anything concerning or new on the feet. All questions answered. Discussed proper shoes as well.     Tina Neal L. Marchia Bond, AACFAS [facility]     [address] [place], [place] [address]                Office [phone]  Fax [phone]

## 2023-08-01 ENCOUNTER — Ambulatory Visit: Payer: Medicare HMO | Admitting: Podiatry

## 2023-08-01 DIAGNOSIS — M79674 Pain in right toe(s): Secondary | ICD-10-CM

## 2023-08-01 DIAGNOSIS — M79675 Pain in left toe(s): Secondary | ICD-10-CM

## 2023-08-01 DIAGNOSIS — E1142 Type 2 diabetes mellitus with diabetic polyneuropathy: Secondary | ICD-10-CM

## 2023-08-01 DIAGNOSIS — L03031 Cellulitis of right toe: Secondary | ICD-10-CM | POA: Diagnosis not present

## 2023-08-01 DIAGNOSIS — B351 Tinea unguium: Secondary | ICD-10-CM | POA: Diagnosis not present

## 2023-08-01 NOTE — Progress Notes (Signed)
       Subjective:  Patient ID: Tina Neal, female    DOB: 13-Mar-1951,  MRN: [id number]  Chief Complaint  Patient presents with   Nail Problem    She reports she is feeling much better with her nail.     Tina Neal presents to clinic today for f/u of total nail avulsion to the right hallux.  Patient states she is doing great.  Denies any pain.  She is Her last day of antibiotics yesterday.  Denies any residual redness, swelling, drainage.  At this point.  She has been outlined with aftercare instructions.  PCP is Emilio Aspen, MD.  Allergies  Allergen Reactions   Maxidex [Dexamethasone] Photosensitivity   Hctz [Hydrochlorothiazide] Rash    Objective:  There were no vitals filed for this visit.  Vascular Examination: Capillary refill time is 3-5 seconds to toes bilateral. Palpable pedal pulses b/l LE. Digital hair absent  b/l. No pedal edema b/l. Skin temperature gradient WNL b/l. No varicosities b/l. No cyanosis or clubbing noted b/l.   Dermatological Examination: Upon inspection of the total nail avulsion site, there are no clinical signs of infection.  No purulence, no necrosis, no malodor present.  Minimal to no erythema present.  Eschar formed along nail margin.  May be thin layer of residual fungal nail at the base of the nail bed due to chronic nail thickening prior to procedure. No pain on palpation.  Assessment/Plan: 1. Paronychia of toe of right foot   2. Type 2 diabetes mellitus with diabetic polyneuropathy, without long-term current use of insulin (HCC)   3. Pain due to onychomycosis of toenails of both feet     No orders of the defined types were placed in this encounter.  Plan: -Right hallux toenail avulsion site healed this time - No residual signs of infection - No draining from granuloma site which appears resolved - Full activity as tolerated at this point -May return sooner if new pedal complaints arise.  Return in about 10 weeks (around  10/10/2023) for Diabetic Foot Care.   Bronwen Betters, DPM, AACFAS [facility]     [address] [place], [place] [address]                Office [phone]  Fax [phone]

## 2023-08-04 DIAGNOSIS — E119 Type 2 diabetes mellitus without complications: Secondary | ICD-10-CM | POA: Diagnosis not present

## 2023-08-04 DIAGNOSIS — H524 Presbyopia: Secondary | ICD-10-CM | POA: Diagnosis not present

## 2023-08-04 DIAGNOSIS — H2513 Age-related nuclear cataract, bilateral: Secondary | ICD-10-CM | POA: Diagnosis not present

## 2023-10-10 ENCOUNTER — Encounter: Payer: Self-pay | Admitting: Podiatry

## 2023-10-10 ENCOUNTER — Ambulatory Visit: Payer: Medicare HMO | Admitting: Podiatry

## 2023-10-10 DIAGNOSIS — M79674 Pain in right toe(s): Secondary | ICD-10-CM | POA: Diagnosis not present

## 2023-10-10 DIAGNOSIS — M19071 Primary osteoarthritis, right ankle and foot: Secondary | ICD-10-CM

## 2023-10-10 DIAGNOSIS — M79675 Pain in left toe(s): Secondary | ICD-10-CM

## 2023-10-10 DIAGNOSIS — M21611 Bunion of right foot: Secondary | ICD-10-CM

## 2023-10-10 DIAGNOSIS — B351 Tinea unguium: Secondary | ICD-10-CM | POA: Diagnosis not present

## 2023-10-10 DIAGNOSIS — E1142 Type 2 diabetes mellitus with diabetic polyneuropathy: Secondary | ICD-10-CM

## 2023-10-10 MED ORDER — MELOXICAM 15 MG PO TABS
15.0000 mg | ORAL_TABLET | Freq: Every day | ORAL | 0 refills | Status: DC
Start: 2023-10-10 — End: 2023-10-31

## 2023-10-10 NOTE — Progress Notes (Signed)
  Subjective:  Patient ID: Tina Neal, female    DOB: 1950/10/27,  MRN: [id number]  Chief Complaint  Patient presents with   dfc    " My nails are not growing too much and here to get them trimmed, does not know her last A1C level"    73 y.o. female presents with the above complaint. History confirmed with patient. Patient presenting with pain related to dystrophic thickened elongated nails. Patient is unable to trim own nails related to nail dystrophy and/or mobility issues. Patient does have a history of T2DM.   Objective:  Physical Exam: warm, good capillary refill, diminished pedal hair growth, pedal skin atrophic nail exam onychomycosis of the toenails, onycholysis, and dystrophic nails DP pulses palpable, PT pulses palpable, and protective sensation decreased Left Foot:  Pain with palpation of nails due to elongation and dystrophic growth.  Right Foot: Pain with palpation of nails due to elongation and dystrophic growth.  Bilateral bunion deformities with spurring right midfoot at TMT J and TN level. Pes planus foot type Assessment:   1. Type 2 diabetes mellitus with diabetic polyneuropathy, without long-term current use of insulin (HCC)   2. Pain due to onychomycosis of toenails of both feet   3. Arthritis of right midfoot   4. Bilateral bunions      Plan:  Patient was evaluated and treated and all questions answered.  #Onychomycosis with pain  -Nails palliatively debrided as below. -Educated on self-care  Procedure: Nail Debridement Rationale: Pain Type of Debridement: manual, sharp debridement. Instrumentation: Nail nipper, rotary burr. Number of Nails: 9  Patient educated on diabetes. Discussed proper diabetic foot care and discussed risks and complications of disease. Educated patient in depth on reasons to return to the office immediately should he/she discover anything concerning or new on the feet. All questions answered. Discussed proper shoes as well.    Due to her bunion deformities and arthritis of right midfoot, believe patient would also benefit from diabetic shoes with 3 sets of multidensity inserts.  Prescription ordered for patient  Starting patient on course of oral meloxicam.  Follow-up for x-rays and possible corticosteroid injection in 2 to 3 weeks if symptoms do not improve  Return in about 3 months (around 01/10/2024) for Diabetic Foot Care.         Bronwen Betters, DPM [facility] / [facility]

## 2023-10-14 DIAGNOSIS — M79671 Pain in right foot: Secondary | ICD-10-CM | POA: Diagnosis not present

## 2023-10-14 DIAGNOSIS — M4800 Spinal stenosis, site unspecified: Secondary | ICD-10-CM | POA: Diagnosis not present

## 2023-10-14 DIAGNOSIS — M545 Low back pain, unspecified: Secondary | ICD-10-CM | POA: Diagnosis not present

## 2023-10-14 DIAGNOSIS — M5416 Radiculopathy, lumbar region: Secondary | ICD-10-CM | POA: Diagnosis not present

## 2023-10-14 DIAGNOSIS — Z981 Arthrodesis status: Secondary | ICD-10-CM | POA: Diagnosis not present

## 2023-10-14 DIAGNOSIS — M961 Postlaminectomy syndrome, not elsewhere classified: Secondary | ICD-10-CM | POA: Diagnosis not present

## 2023-10-31 ENCOUNTER — Other Ambulatory Visit: Payer: Self-pay | Admitting: Podiatry

## 2023-10-31 DIAGNOSIS — M19071 Primary osteoarthritis, right ankle and foot: Secondary | ICD-10-CM

## 2023-11-17 ENCOUNTER — Other Ambulatory Visit

## 2023-11-22 ENCOUNTER — Other Ambulatory Visit: Payer: Self-pay | Admitting: Podiatry

## 2023-11-22 DIAGNOSIS — M19071 Primary osteoarthritis, right ankle and foot: Secondary | ICD-10-CM

## 2023-12-11 DIAGNOSIS — R399 Unspecified symptoms and signs involving the genitourinary system: Secondary | ICD-10-CM | POA: Diagnosis not present

## 2023-12-11 DIAGNOSIS — M545 Low back pain, unspecified: Secondary | ICD-10-CM | POA: Diagnosis not present

## 2023-12-12 ENCOUNTER — Other Ambulatory Visit: Payer: Self-pay | Admitting: Podiatry

## 2023-12-12 DIAGNOSIS — M19071 Primary osteoarthritis, right ankle and foot: Secondary | ICD-10-CM

## 2023-12-16 ENCOUNTER — Telehealth: Payer: Self-pay

## 2023-12-16 NOTE — Telephone Encounter (Signed)
 LVM to cancel DSM appt

## 2024-01-01 ENCOUNTER — Other Ambulatory Visit

## 2024-01-06 ENCOUNTER — Other Ambulatory Visit: Payer: Self-pay | Admitting: Podiatry

## 2024-01-06 DIAGNOSIS — Z79899 Other long term (current) drug therapy: Secondary | ICD-10-CM | POA: Diagnosis not present

## 2024-01-06 DIAGNOSIS — M19071 Primary osteoarthritis, right ankle and foot: Secondary | ICD-10-CM

## 2024-01-06 DIAGNOSIS — K219 Gastro-esophageal reflux disease without esophagitis: Secondary | ICD-10-CM | POA: Diagnosis not present

## 2024-01-06 DIAGNOSIS — Z0000 Encounter for general adult medical examination without abnormal findings: Secondary | ICD-10-CM | POA: Diagnosis not present

## 2024-01-06 DIAGNOSIS — E559 Vitamin D deficiency, unspecified: Secondary | ICD-10-CM | POA: Diagnosis not present

## 2024-01-06 DIAGNOSIS — J4520 Mild intermittent asthma, uncomplicated: Secondary | ICD-10-CM | POA: Diagnosis not present

## 2024-01-06 DIAGNOSIS — E039 Hypothyroidism, unspecified: Secondary | ICD-10-CM | POA: Diagnosis not present

## 2024-01-06 DIAGNOSIS — I10 Essential (primary) hypertension: Secondary | ICD-10-CM | POA: Diagnosis not present

## 2024-01-06 DIAGNOSIS — E1143 Type 2 diabetes mellitus with diabetic autonomic (poly)neuropathy: Secondary | ICD-10-CM | POA: Diagnosis not present

## 2024-01-06 DIAGNOSIS — E782 Mixed hyperlipidemia: Secondary | ICD-10-CM | POA: Diagnosis not present

## 2024-01-08 DIAGNOSIS — E119 Type 2 diabetes mellitus without complications: Secondary | ICD-10-CM | POA: Diagnosis not present

## 2024-01-08 DIAGNOSIS — I10 Essential (primary) hypertension: Secondary | ICD-10-CM | POA: Diagnosis not present

## 2024-01-08 DIAGNOSIS — J449 Chronic obstructive pulmonary disease, unspecified: Secondary | ICD-10-CM | POA: Diagnosis not present

## 2024-01-08 DIAGNOSIS — E1143 Type 2 diabetes mellitus with diabetic autonomic (poly)neuropathy: Secondary | ICD-10-CM | POA: Diagnosis not present

## 2024-01-09 ENCOUNTER — Ambulatory Visit: Admitting: Podiatry

## 2024-01-14 DIAGNOSIS — I10 Essential (primary) hypertension: Secondary | ICD-10-CM | POA: Diagnosis not present

## 2024-01-14 DIAGNOSIS — E119 Type 2 diabetes mellitus without complications: Secondary | ICD-10-CM | POA: Diagnosis not present

## 2024-01-14 DIAGNOSIS — E1143 Type 2 diabetes mellitus with diabetic autonomic (poly)neuropathy: Secondary | ICD-10-CM | POA: Diagnosis not present

## 2024-01-14 DIAGNOSIS — J449 Chronic obstructive pulmonary disease, unspecified: Secondary | ICD-10-CM | POA: Diagnosis not present

## 2024-01-21 ENCOUNTER — Ambulatory Visit: Admitting: Podiatry

## 2024-01-27 ENCOUNTER — Other Ambulatory Visit: Payer: Self-pay | Admitting: Podiatry

## 2024-01-27 DIAGNOSIS — M19071 Primary osteoarthritis, right ankle and foot: Secondary | ICD-10-CM

## 2024-01-28 ENCOUNTER — Ambulatory Visit (INDEPENDENT_AMBULATORY_CARE_PROVIDER_SITE_OTHER): Admitting: Podiatry

## 2024-01-28 ENCOUNTER — Encounter: Payer: Self-pay | Admitting: Podiatry

## 2024-01-28 DIAGNOSIS — M79674 Pain in right toe(s): Secondary | ICD-10-CM | POA: Diagnosis not present

## 2024-01-28 DIAGNOSIS — M2141 Flat foot [pes planus] (acquired), right foot: Secondary | ICD-10-CM

## 2024-01-28 DIAGNOSIS — M79675 Pain in left toe(s): Secondary | ICD-10-CM | POA: Diagnosis not present

## 2024-01-28 DIAGNOSIS — B351 Tinea unguium: Secondary | ICD-10-CM | POA: Diagnosis not present

## 2024-01-28 DIAGNOSIS — M2010 Hallux valgus (acquired), unspecified foot: Secondary | ICD-10-CM

## 2024-01-28 DIAGNOSIS — E1142 Type 2 diabetes mellitus with diabetic polyneuropathy: Secondary | ICD-10-CM

## 2024-01-28 DIAGNOSIS — M2142 Flat foot [pes planus] (acquired), left foot: Secondary | ICD-10-CM

## 2024-01-28 NOTE — Progress Notes (Signed)
 This patient returns to my office for at risk foot care.  This patient requires this care by a professional since this patient will be at risk due to having diabetes.  This patient is unable to cut nails herself since the patient cannot reach her nails.These nails are painful walking and wearing shoes.  This patient presents for at risk foot care today.  General Appearance  Alert, conversant and in no acute stress.  Vascular  Dorsalis pedis and posterior tibial  pulses are palpable  bilaterally.  Capillary return is within normal limits  bilaterally. Temperature is within normal limits  bilaterally.  Neurologic  Senn-Weinstein monofilament wire test diminished  bilaterally. Muscle power within normal limits bilaterally.  Nails Thick disfigured discolored nails with subungual debris  from hallux to fifth toes bilaterally. No evidence of bacterial infection or drainage bilaterally.  Orthopedic  No limitations of motion  feet .  No crepitus or effusions noted.  No bony pathology or digital deformities noted.  HAV B/L and pes planus.  Skin  normotropic skin with no porokeratosis noted bilaterally.  No signs of infections or ulcers noted.     Onychomycosis  Pain in right toes  Pain in left toes  Consent was obtained for treatment procedures.   Mechanical debridement of nails 1-5  bilaterally performed with a nail nipper.  Filed with dremel without incident. Patient request an appointment for cortisone shot.  Told her to reappoint with Dr.  Lamount.     Return office visit                     Told patient to return for periodic foot care and evaluation due to potential at risk complications.   Cordella Bold DPM

## 2024-02-02 DIAGNOSIS — J449 Chronic obstructive pulmonary disease, unspecified: Secondary | ICD-10-CM | POA: Diagnosis not present

## 2024-02-02 DIAGNOSIS — E1143 Type 2 diabetes mellitus with diabetic autonomic (poly)neuropathy: Secondary | ICD-10-CM | POA: Diagnosis not present

## 2024-02-02 DIAGNOSIS — E039 Hypothyroidism, unspecified: Secondary | ICD-10-CM | POA: Diagnosis not present

## 2024-02-12 DIAGNOSIS — M79671 Pain in right foot: Secondary | ICD-10-CM | POA: Diagnosis not present

## 2024-02-12 DIAGNOSIS — Z79899 Other long term (current) drug therapy: Secondary | ICD-10-CM | POA: Diagnosis not present

## 2024-02-12 DIAGNOSIS — Z981 Arthrodesis status: Secondary | ICD-10-CM | POA: Diagnosis not present

## 2024-02-12 DIAGNOSIS — M4800 Spinal stenosis, site unspecified: Secondary | ICD-10-CM | POA: Diagnosis not present

## 2024-02-13 DIAGNOSIS — Z5189 Encounter for other specified aftercare: Secondary | ICD-10-CM | POA: Diagnosis not present

## 2024-02-15 ENCOUNTER — Other Ambulatory Visit: Payer: Self-pay | Admitting: Podiatry

## 2024-02-15 DIAGNOSIS — M19071 Primary osteoarthritis, right ankle and foot: Secondary | ICD-10-CM

## 2024-02-19 ENCOUNTER — Ambulatory Visit: Admitting: Podiatry

## 2024-03-04 DIAGNOSIS — I10 Essential (primary) hypertension: Secondary | ICD-10-CM | POA: Diagnosis not present

## 2024-03-04 DIAGNOSIS — E039 Hypothyroidism, unspecified: Secondary | ICD-10-CM | POA: Diagnosis not present

## 2024-03-04 DIAGNOSIS — E119 Type 2 diabetes mellitus without complications: Secondary | ICD-10-CM | POA: Diagnosis not present

## 2024-03-04 DIAGNOSIS — J449 Chronic obstructive pulmonary disease, unspecified: Secondary | ICD-10-CM | POA: Diagnosis not present

## 2024-03-04 DIAGNOSIS — E1143 Type 2 diabetes mellitus with diabetic autonomic (poly)neuropathy: Secondary | ICD-10-CM | POA: Diagnosis not present

## 2024-03-05 ENCOUNTER — Encounter: Payer: Self-pay | Admitting: Podiatry

## 2024-03-05 ENCOUNTER — Ambulatory Visit

## 2024-03-05 ENCOUNTER — Ambulatory Visit: Admitting: Podiatry

## 2024-03-05 DIAGNOSIS — M25571 Pain in right ankle and joints of right foot: Secondary | ICD-10-CM

## 2024-03-05 DIAGNOSIS — G8929 Other chronic pain: Secondary | ICD-10-CM

## 2024-03-05 DIAGNOSIS — M76821 Posterior tibial tendinitis, right leg: Secondary | ICD-10-CM

## 2024-03-05 DIAGNOSIS — M2142 Flat foot [pes planus] (acquired), left foot: Secondary | ICD-10-CM | POA: Diagnosis not present

## 2024-03-05 DIAGNOSIS — M2141 Flat foot [pes planus] (acquired), right foot: Secondary | ICD-10-CM | POA: Diagnosis not present

## 2024-03-05 MED ORDER — TRIAMCINOLONE ACETONIDE 10 MG/ML IJ SUSP
10.0000 mg | Freq: Once | INTRAMUSCULAR | Status: AC
Start: 2024-03-05 — End: 2024-03-05
  Administered 2024-03-05: 10 mg

## 2024-03-05 NOTE — Progress Notes (Signed)
 Chief Complaint  Patient presents with   Foot Pain    Right foot lateral ankle pain. 5 pain. Taking tramadol . NIDDM A1C 6.1. wears ankle brace.    HPI: 73 y.o. female presents today with ongoing right foot and ankle pain.  It is worse with weightbearing.  She does have advanced flatfoot deformity.  She has been using a ankle brace which does help somewhat.  She locates pain to the medial arch and ankle along PT tendon and lateral ankle as well.  Past Medical History:  Diagnosis Date   Arthritis    Asthma    Diabetes mellitus without complication (HCC)    GERD (gastroesophageal reflux disease)    Hyperlipidemia    Hypertension    Hypothyroidism    Pancreatitis    history    Past Surgical History:  Procedure Laterality Date   ABDOMINAL HYSTERECTOMY     ANTERIOR LAT LUMBAR FUSION N/A 08/29/2021   Procedure: EXTREME LATERAL INTERBODY FUSION LUMBAR TWO THROUGH FOUR;  Surgeon: Burnetta Aures, MD;  [facility]: [facility];  Service: Orthopedics;  Laterality: N/A;  3.5 hrs left tap block with exparel  3 C-Bed   BACK SURGERY  2011   lumbar fusion   CALCANEAL OSTEOTOMY Left 10/28/2013   Procedure: LEFT CALCANEAL OSTEOTOMY;  Surgeon: Norleen Armor, MD;  Location: [facility];  Service: Orthopedics;  Laterality: Left;   CHOLECYSTECTOMY  2009   GASTROC RECESSION EXTREMITY Left 10/28/2013   Procedure: LEFT GASTROCNEMIUS RECESSION ;  Surgeon: Norleen Armor, MD;  Location: [facility];  Service: Orthopedics;  Laterality: Left;   JOINT REPLACEMENT     bilat total knees   KNEE SURGERY     right   rotator cuff     rt   TENDON TRANSFER Left 10/28/2013   Procedure: LEFT FLEXOR DIGITORUM LONGUS TRANSFER, POSTERIOR TIBIAL TENOLYSIS;  Surgeon: Norleen Armor, MD;  Location: [facility];  Service: Orthopedics;  Laterality: Left;   THYROIDECTOMY     TOTAL KNEE REVISION Right 03/05/2023   Procedure: Right knee femoral arthroplasty revision;  Surgeon: Melodi Lerner,  MD;  Location: [facility];  Service: Orthopedics;  Laterality: Right;    Allergies  Allergen Reactions   Maxidex  [Dexamethasone ] Photosensitivity   Hctz [Hydrochlorothiazide] Rash    ROS    Physical Exam: There were no vitals filed for this visit.  General: The patient is alert and oriented x3 in no acute distress.  Dermatology: Skin is warm, dry and supple bilateral lower extremities. Interspaces are clear of maceration and debris.    Vascular: Palpable pedal pulses bilaterally. Capillary refill within normal limits.  No appreciable edema.  No erythema or calor.  Neurological: Light touch sensation grossly intact bilateral feet.  Decreased protective sensation.  Musculoskeletal Exam: Pes planus deformity right foot greater than left.  Significant pes planovalgus.  The joints are mobile but the deformities not fully reducible.  Prominent TN plantar medially.  Significant localized edema lateral ankle infra malleolar.  Significant tenderness on palpation of the right PT tendon and navicular insertion.  Generalized tenderness on palpation of right sinus tarsi, lateral hindfoot and ankle.  Equinus noted.  Bilateral bunions present.  Radiographic Exam: Right foot 2 views and ankle 3 views weightbearing 03/05/2024 radiographs Severe pes planovalgus deformity present with greater than 50% talar head uncoverage.  Moderate to severe bunion deformity present with lateral deviation of the MPJ and significant sesamoid rotation.  On ankle views, the ankle mortise appears aligned with  significant valgus alignment of the subtalar joint with calcaneus possibly abutting against fibula.  Assessment/Plan of Care: 1. Chronic pain of right ankle   2. Posterior tibial tendon dysfunction (PTTD) of right lower extremity   3. Pes planus of both feet      Meds ordered this encounter  Medications   triamcinolone  acetonide (KENALOG ) 10 MG/ML injection 10 mg   DG FOOT COMPLETE RIGHT   Discussed the etiology,  pathomechanics and treatment options in detail with the patient, we also reviewed today's radiographs in detail.  We discussed how pes planus deformity without pain or functional limitation is quite common and often does not require any treatment.  However when pain or functional limitation arises, treatment with nonsurgical therapy is our first line with stretching, physical therapy, and supportive orthoses.  Also discussed that when these treatments fail patients often do well with surgical treatment of these deformities.  We also discussed the impact of the deformity on soft tissue and the joints and development of arthritis over time. At this point the deformity is causing paint to lateral ankle and hindfoot and can be causing impingement.  Today I recommended that the patient continue with use of the lace up ankle brace.  Did recommend quality over-the-counter orthotics to see if she would get some benefit from this.  If she does get some benefit from use of over-the-counter power steps or Superfeet she may benefit from custom orthotics.  Also recommend that she take over-the-counter naproxen  for the next 1 to 2 weeks up to 2 times a day as needed.  She is not interested in surgery at this time.  Today elected to administer corticosteroid injection to the right PT tendon.  Verbal consent was obtained. Alcohol skin prep.  Injected 0.5 cc of 0.5% Marcaine  plain mixed with 10 mg Kenalog  via medial approach.  Band-Aid applied.  Patient tolerated this well.  Will have her follow up in 2-3 weeks to monitor for improvement and focus on lateral pain as well.    Angelia Hazell L. Lamount MAUL, AACFAS [facility]     [address] [place], [place] [address]                Office [phone]  Fax [phone]

## 2024-03-07 DIAGNOSIS — E1143 Type 2 diabetes mellitus with diabetic autonomic (poly)neuropathy: Secondary | ICD-10-CM | POA: Diagnosis not present

## 2024-03-07 DIAGNOSIS — E119 Type 2 diabetes mellitus without complications: Secondary | ICD-10-CM | POA: Diagnosis not present

## 2024-03-07 DIAGNOSIS — I10 Essential (primary) hypertension: Secondary | ICD-10-CM | POA: Diagnosis not present

## 2024-03-07 DIAGNOSIS — J449 Chronic obstructive pulmonary disease, unspecified: Secondary | ICD-10-CM | POA: Diagnosis not present

## 2024-03-12 DIAGNOSIS — H524 Presbyopia: Secondary | ICD-10-CM | POA: Diagnosis not present

## 2024-03-12 DIAGNOSIS — E119 Type 2 diabetes mellitus without complications: Secondary | ICD-10-CM | POA: Diagnosis not present

## 2024-03-12 DIAGNOSIS — H2513 Age-related nuclear cataract, bilateral: Secondary | ICD-10-CM | POA: Diagnosis not present

## 2024-03-24 ENCOUNTER — Other Ambulatory Visit (HOSPITAL_COMMUNITY): Payer: Self-pay | Admitting: Physician Assistant

## 2024-03-24 DIAGNOSIS — R10812 Left upper quadrant abdominal tenderness: Secondary | ICD-10-CM | POA: Diagnosis not present

## 2024-03-24 DIAGNOSIS — R1032 Left lower quadrant pain: Secondary | ICD-10-CM

## 2024-03-25 ENCOUNTER — Ambulatory Visit (HOSPITAL_COMMUNITY)
Admission: RE | Admit: 2024-03-25 | Discharge: 2024-03-25 | Disposition: A | Discharge status: Home / Self Care | Source: Ambulatory Visit | Attending: Physician Assistant | Admitting: Physician Assistant

## 2024-03-25 DIAGNOSIS — R1032 Left lower quadrant pain: Secondary | ICD-10-CM | POA: Insufficient documentation

## 2024-03-25 MED ORDER — IOHEXOL 300 MG/ML  SOLN
100.0000 mL | Freq: Once | INTRAMUSCULAR | Status: AC | PRN
  Administered 2024-03-25: 100 mL via INTRAVENOUS

## 2024-03-25 MED ORDER — SODIUM CHLORIDE (PF) 0.9 % IJ SOLN
INTRAMUSCULAR | Status: AC
  Filled 2024-03-25: qty 50

## 2024-03-26 ENCOUNTER — Encounter: Payer: Self-pay | Admitting: Podiatry

## 2024-03-26 ENCOUNTER — Ambulatory Visit (INDEPENDENT_AMBULATORY_CARE_PROVIDER_SITE_OTHER): Admitting: Podiatry

## 2024-03-26 VITALS — Wt 185.0 lb

## 2024-03-26 DIAGNOSIS — M2141 Flat foot [pes planus] (acquired), right foot: Secondary | ICD-10-CM

## 2024-03-26 DIAGNOSIS — M25871 Other specified joint disorders, right ankle and foot: Secondary | ICD-10-CM | POA: Diagnosis not present

## 2024-03-26 DIAGNOSIS — M2142 Flat foot [pes planus] (acquired), left foot: Secondary | ICD-10-CM

## 2024-03-26 DIAGNOSIS — M76821 Posterior tibial tendinitis, right leg: Secondary | ICD-10-CM

## 2024-03-26 MED ORDER — TRIAMCINOLONE ACETONIDE 10 MG/ML IJ SUSP
10.0000 mg | Freq: Once | INTRAMUSCULAR | Status: AC
Start: 2024-03-26 — End: 2024-03-26
  Administered 2024-03-26: 10 mg

## 2024-03-26 NOTE — Progress Notes (Signed)
 Chief Complaint  Patient presents with   Foot Pain    3 weeks follow up for PTTD. R lateral foot pain. 6. Wearing tri lock brace. Diabetic. A1c 6.1  no anti coag    HPI: 73 y.o. female presents today with ongoing right foot and ankle pain.  It is worse with weightbearing.  She does have advanced flatfoot deformity.  She has been using a ankle brace which does help somewhat.  Injection has been helpful to PT tendon somewhat.  Most of her pain today is lateral ankle.  Past Medical History:  Diagnosis Date   Arthritis    Asthma    Diabetes mellitus without complication (HCC)    GERD (gastroesophageal reflux disease)    Hyperlipidemia    Hypertension    Hypothyroidism    Pancreatitis    history    Past Surgical History:  Procedure Laterality Date   ABDOMINAL HYSTERECTOMY     ANTERIOR LAT LUMBAR FUSION N/A 08/29/2021   Procedure: EXTREME LATERAL INTERBODY FUSION LUMBAR TWO THROUGH FOUR;  Surgeon: Burnetta Aures, MD;  [facility]: [facility];  Service: Orthopedics;  Laterality: N/A;  3.5 hrs left tap block with exparel  3 C-Bed   BACK SURGERY  2011   lumbar fusion   CALCANEAL OSTEOTOMY Left 10/28/2013   Procedure: LEFT CALCANEAL OSTEOTOMY;  Surgeon: Norleen Armor, MD;  Location: [facility];  Service: Orthopedics;  Laterality: Left;   CHOLECYSTECTOMY  2009   GASTROC RECESSION EXTREMITY Left 10/28/2013   Procedure: LEFT GASTROCNEMIUS RECESSION ;  Surgeon: Norleen Armor, MD;  Location: [facility];  Service: Orthopedics;  Laterality: Left;   JOINT REPLACEMENT     bilat total knees   KNEE SURGERY     right   rotator cuff     rt   TENDON TRANSFER Left 10/28/2013   Procedure: LEFT FLEXOR DIGITORUM LONGUS TRANSFER, POSTERIOR TIBIAL TENOLYSIS;  Surgeon: Norleen Armor, MD;  [facility];  Service: Orthopedics;  Laterality: Left;   THYROIDECTOMY     TOTAL KNEE REVISION Right 03/05/2023   Procedure: Right knee femoral arthroplasty revision;   Surgeon: Melodi Lerner, MD;  [facility]: [facility];  Service: Orthopedics;  Laterality: Right;    Allergies  Allergen Reactions   Maxidex  [Dexamethasone ] Photosensitivity   Hydrochlorothiazide Rash and Dermatitis    ROS    Physical Exam: There were no vitals filed for this visit.  General: The patient is alert and oriented x3 in no acute distress.  Dermatology: Skin is warm, dry and supple bilateral lower extremities. Interspaces are clear of maceration and debris.    Vascular: Palpable pedal pulses bilaterally. Capillary refill within normal limits.  No appreciable edema.  No erythema or calor.  Neurological: Light touch sensation grossly intact bilateral feet.  Decreased protective sensation.  Musculoskeletal Exam: Pes planus deformity right foot greater than left.  Significant pes planovalgus.  The joints are mobile but the deformities not fully reducible.  Prominent TN plantar medially.  Significant localized edema lateral ankle infra malleolar.  Significant tenderness on palpation of the right PT tendon and navicular insertion.  Generalized tenderness on palpation of right sinus tarsi, lateral hindfoot and ankle.  Equinus noted.  Bilateral bunions present.  Increased pain with end-stage eversion range of motion.  Radiographic Exam: Right foot 2 views and ankle 3 views weightbearing 03/05/2024 radiographs Severe pes planovalgus deformity present with greater than 50% talar head uncoverage.  Moderate to severe bunion deformity present with lateral deviation  of the MPJ and significant sesamoid rotation.  On ankle views, the ankle mortise appears aligned with significant valgus alignment of the subtalar joint with calcaneus possibly abutting against fibula.  Assessment/Plan of Care: 1. Ankle impingement syndrome, right   2. Pes planus of both feet   3. Posterior tibial tendon dysfunction (PTTD) of right lower extremity      Meds ordered this encounter  Medications   triamcinolone   acetonide (KENALOG ) 10 MG/ML injection 10 mg   FOR HOME USE ONLY DME [employer]UST[employer] ORTHOTICS   Continue with use of the lace up ankle bracing in the power steps, use of good supportive shoes.  Today verbal consent was obtained to administer corticosteroid injection for right lateral ankle impingement.  Betadine  skin prep.  Injected 0.5 cc of 0.5% Marcaine  plain mixed with 0.5 cc Kenalog  10 and 0.5 cc dexamethasone  administered to the ankle joint via infra lateral malleolar approach.  Band-Aid applied.  She is not interested in surgery at this time, overall not an ideal surgical candidate.  Prescription written for custom ankle Richie brace for the flatfoot deformity and lateral ankle impingement.  Reevaluate about 4 weeks   Anajah Sterbenz L. Lamount MAUL, AACFAS [facility]     [address] [place], [place] [address]                Office [phone]  Fax [phone]

## 2024-04-04 DIAGNOSIS — E1143 Type 2 diabetes mellitus with diabetic autonomic (poly)neuropathy: Secondary | ICD-10-CM | POA: Diagnosis not present

## 2024-04-04 DIAGNOSIS — E119 Type 2 diabetes mellitus without complications: Secondary | ICD-10-CM | POA: Diagnosis not present

## 2024-04-04 DIAGNOSIS — J449 Chronic obstructive pulmonary disease, unspecified: Secondary | ICD-10-CM | POA: Diagnosis not present

## 2024-04-04 DIAGNOSIS — I10 Essential (primary) hypertension: Secondary | ICD-10-CM | POA: Diagnosis not present

## 2024-04-04 DIAGNOSIS — E039 Hypothyroidism, unspecified: Secondary | ICD-10-CM | POA: Diagnosis not present

## 2024-04-06 DIAGNOSIS — E1143 Type 2 diabetes mellitus with diabetic autonomic (poly)neuropathy: Secondary | ICD-10-CM | POA: Diagnosis not present

## 2024-04-06 DIAGNOSIS — I10 Essential (primary) hypertension: Secondary | ICD-10-CM | POA: Diagnosis not present

## 2024-04-06 DIAGNOSIS — E119 Type 2 diabetes mellitus without complications: Secondary | ICD-10-CM | POA: Diagnosis not present

## 2024-04-06 DIAGNOSIS — J449 Chronic obstructive pulmonary disease, unspecified: Secondary | ICD-10-CM | POA: Diagnosis not present

## 2024-04-23 ENCOUNTER — Encounter: Payer: Self-pay | Admitting: Podiatry

## 2024-04-23 ENCOUNTER — Ambulatory Visit: Admitting: Podiatry

## 2024-04-23 DIAGNOSIS — M2141 Flat foot [pes planus] (acquired), right foot: Secondary | ICD-10-CM | POA: Diagnosis not present

## 2024-04-23 DIAGNOSIS — M76821 Posterior tibial tendinitis, right leg: Secondary | ICD-10-CM

## 2024-04-23 DIAGNOSIS — M25871 Other specified joint disorders, right ankle and foot: Secondary | ICD-10-CM

## 2024-04-23 DIAGNOSIS — M2142 Flat foot [pes planus] (acquired), left foot: Secondary | ICD-10-CM

## 2024-04-23 NOTE — Progress Notes (Signed)
 Chief Complaint  Patient presents with   Foot Pain    Pt is here for a follow up on her right ankle she stated that she is still having pain with it she stated that the injection helped but it did not last long.     HPI: 73 y.o. female presents today with ongoing right foot and ankle pain.  She reports only brief relief with the lateral ankle injection last visit.  She has been making efforts to wear good supportive shoes.  ASO ankle brace only somewhat helpful.  She does have pending appointment for custom brace upcoming later this month.  Past Medical History:  Diagnosis Date   Arthritis    Asthma    Diabetes mellitus without complication (HCC)    GERD (gastroesophageal reflux disease)    Hyperlipidemia    Hypertension    Hypothyroidism    Pancreatitis    history    Past Surgical History:  Procedure Laterality Date   ABDOMINAL HYSTERECTOMY     ANTERIOR LAT LUMBAR FUSION N/A 08/29/2021   Procedure: EXTREME LATERAL INTERBODY FUSION LUMBAR TWO THROUGH FOUR;  Surgeon: Burnetta Aures, MD;  Location: [facility];  Service: Orthopedics;  Laterality: N/A;  3.5 hrs left tap block with exparel  3 C-Bed   BACK SURGERY  2011   lumbar fusion   CALCANEAL OSTEOTOMY Left 10/28/2013   Procedure: LEFT CALCANEAL OSTEOTOMY;  Surgeon: Norleen Armor, MD;  Location: [facility];  Service: Orthopedics;  Laterality: Left;   CHOLECYSTECTOMY  2009   GASTROC RECESSION EXTREMITY Left 10/28/2013   Procedure: LEFT GASTROCNEMIUS RECESSION ;  Surgeon: Norleen Armor, MD;  Location: [facility];  Service: Orthopedics;  Laterality: Left;   JOINT REPLACEMENT     bilat total knees   KNEE SURGERY     right   rotator cuff     rt   TENDON TRANSFER Left 10/28/2013   Procedure: LEFT FLEXOR DIGITORUM LONGUS TRANSFER, POSTERIOR TIBIAL TENOLYSIS;  Surgeon: Norleen Armor, MD;  Location: [facility];  Service: Orthopedics;  Laterality: Left;   THYROIDECTOMY     TOTAL KNEE REVISION  Right 03/05/2023   Procedure: Right knee femoral arthroplasty revision;  Surgeon: Melodi Lerner, MD;  [facility]: [facility];  Service: Orthopedics;  Laterality: Right;    Allergies  Allergen Reactions   Maxidex  [Dexamethasone ] Photosensitivity   Hydrochlorothiazide Rash and Dermatitis    ROS    Physical Exam: There were no vitals filed for this visit.  General: The patient is alert and oriented x3 in no acute distress.  Dermatology: Skin is warm, dry and supple bilateral lower extremities. Interspaces are clear of maceration and debris.    Vascular: Palpable pedal pulses bilaterally. Capillary refill within normal limits.  No appreciable edema.  No erythema or calor.  Neurological: Light touch sensation grossly intact bilateral feet.  Decreased protective sensation.  Musculoskeletal Exam: Pes planus deformity right foot greater than left.  Significant pes planovalgus.  The joints are mobile but the deformities not fully reducible.  Prominent TN plantar medially.  Significant localized edema right lateral ankle infra malleolar.  Significant tenderness on palpation of the right PT tendon and navicular insertion.  Generalized tenderness on palpation of right sinus tarsi, lateral hindfoot and ankle.  Equinus noted.  Bilateral bunions present.  Increased pain with end-stage eversion range of motion.  Radiographic Exam: Right foot 2 views and ankle 3 views weightbearing 03/05/2024 radiographs Severe pes planovalgus deformity present with greater than  50% talar head uncoverage.  Moderate to severe bunion deformity present with lateral deviation of the MPJ and significant sesamoid rotation.  On ankle views, the ankle mortise appears aligned with significant valgus alignment of the subtalar joint with calcaneus possibly abutting against fibula.  Assessment/Plan of Care: 1. Posterior tibial tendon dysfunction (PTTD) of right lower extremity   2. Ankle impingement syndrome, right   3. Pes planus of both  feet      No orders of the defined types were placed in this encounter.  None   Continue with use of the lace up ankle bracing in the power steps, use of good supportive shoes.  Continue RICE therapy and supportive care.  She can continue to take over-the-counter NSAIDs as well and use topical Voltaren gel.  Does have upcoming fitting appointment for custom ankle brace.  Will have patient follow-up approximately 1 to 2 months after obtaining a brace to see if we have some improvement.  Juris Gosnell L. Lamount MAUL, AACFAS [facility]     [address] [place], [place] [address]                Office [phone]  Fax [phone]

## 2024-04-27 DIAGNOSIS — J069 Acute upper respiratory infection, unspecified: Secondary | ICD-10-CM | POA: Diagnosis not present

## 2024-04-27 DIAGNOSIS — E1169 Type 2 diabetes mellitus with other specified complication: Secondary | ICD-10-CM | POA: Diagnosis not present

## 2024-04-27 DIAGNOSIS — R110 Nausea: Secondary | ICD-10-CM | POA: Diagnosis not present

## 2024-04-29 ENCOUNTER — Ambulatory Visit: Admitting: Podiatry

## 2024-04-30 ENCOUNTER — Encounter: Payer: Self-pay | Admitting: Podiatry

## 2024-04-30 ENCOUNTER — Ambulatory Visit (INDEPENDENT_AMBULATORY_CARE_PROVIDER_SITE_OTHER): Admitting: Podiatry

## 2024-04-30 ENCOUNTER — Ambulatory Visit

## 2024-04-30 DIAGNOSIS — M79674 Pain in right toe(s): Secondary | ICD-10-CM

## 2024-04-30 DIAGNOSIS — E1142 Type 2 diabetes mellitus with diabetic polyneuropathy: Secondary | ICD-10-CM

## 2024-04-30 DIAGNOSIS — B351 Tinea unguium: Secondary | ICD-10-CM

## 2024-04-30 DIAGNOSIS — M79675 Pain in left toe(s): Secondary | ICD-10-CM

## 2024-04-30 NOTE — Progress Notes (Signed)
 Patient was present and casted for [place]  breeze custom AFO  Patient has Pes Planus PTTD and OA of Right ankle with severe overpronation  Patient has been using figure 8 ASO for several years but now needing more support  Patient was also given DM shoe books that we will measure for after getting AFO  I will send docs only order to Dr. Dorn Sauers MD for DM shoes   Lolita Schultze Cped, CFo, CFm   [employer] ded met 12% coins approx $137.50 oop for AFO / patient is aware

## 2024-04-30 NOTE — Progress Notes (Signed)
 This patient returns to my office for at risk foot care.  This patient requires this care by a professional since this patient will be at risk due to having diabetes.  This patient is unable to cut nails herself since the patient cannot reach her nails.These nails are painful walking and wearing shoes.  This patient presents for at risk foot care today.  General Appearance  Alert, conversant and in no acute stress.  Vascular  Dorsalis pedis and posterior tibial  pulses are palpable  bilaterally.  Capillary return is within normal limits  bilaterally. Temperature is within normal limits  bilaterally.  Neurologic  Senn-Weinstein monofilament wire test diminished  bilaterally. Muscle power within normal limits bilaterally.  Nails Thick disfigured discolored nails with subungual debris  from hallux to fifth toes bilaterally. No evidence of bacterial infection or drainage bilaterally.  Orthopedic  No limitations of motion  feet .  No crepitus or effusions noted.  No bony pathology or digital deformities noted.  HAV B/L and pes planus.  Skin  normotropic skin with no porokeratosis noted bilaterally.  No signs of infections or ulcers noted.     Onychomycosis  Pain in right toes  Pain in left toes  Consent was obtained for treatment procedures.   Mechanical debridement of nails 1-5  bilaterally performed with a nail nipper.  Filed with dremel without incident.    Return office visit   3 months                   Told patient to return for periodic foot care and evaluation due to potential at risk complications.   Cordella Bold DPM [facility]

## 2024-05-04 DIAGNOSIS — J449 Chronic obstructive pulmonary disease, unspecified: Secondary | ICD-10-CM | POA: Diagnosis not present

## 2024-05-04 DIAGNOSIS — I10 Essential (primary) hypertension: Secondary | ICD-10-CM | POA: Diagnosis not present

## 2024-05-04 DIAGNOSIS — E039 Hypothyroidism, unspecified: Secondary | ICD-10-CM | POA: Diagnosis not present

## 2024-05-04 DIAGNOSIS — E1143 Type 2 diabetes mellitus with diabetic autonomic (poly)neuropathy: Secondary | ICD-10-CM | POA: Diagnosis not present

## 2024-05-04 DIAGNOSIS — E119 Type 2 diabetes mellitus without complications: Secondary | ICD-10-CM | POA: Diagnosis not present

## 2024-05-06 DIAGNOSIS — E119 Type 2 diabetes mellitus without complications: Secondary | ICD-10-CM | POA: Diagnosis not present

## 2024-05-06 DIAGNOSIS — E1143 Type 2 diabetes mellitus with diabetic autonomic (poly)neuropathy: Secondary | ICD-10-CM | POA: Diagnosis not present

## 2024-05-06 DIAGNOSIS — I10 Essential (primary) hypertension: Secondary | ICD-10-CM | POA: Diagnosis not present

## 2024-05-06 DIAGNOSIS — J449 Chronic obstructive pulmonary disease, unspecified: Secondary | ICD-10-CM | POA: Diagnosis not present

## 2024-05-08 DIAGNOSIS — Z23 Encounter for immunization: Secondary | ICD-10-CM | POA: Diagnosis not present

## 2024-05-21 NOTE — Progress Notes (Signed)
 Order placed for [place] breeze AFO  Lolita Schultze

## 2024-06-04 DIAGNOSIS — E1143 Type 2 diabetes mellitus with diabetic autonomic (poly)neuropathy: Secondary | ICD-10-CM | POA: Diagnosis not present

## 2024-06-04 DIAGNOSIS — I10 Essential (primary) hypertension: Secondary | ICD-10-CM | POA: Diagnosis not present

## 2024-06-04 DIAGNOSIS — E039 Hypothyroidism, unspecified: Secondary | ICD-10-CM | POA: Diagnosis not present

## 2024-06-04 DIAGNOSIS — E119 Type 2 diabetes mellitus without complications: Secondary | ICD-10-CM | POA: Diagnosis not present

## 2024-06-04 DIAGNOSIS — J449 Chronic obstructive pulmonary disease, unspecified: Secondary | ICD-10-CM | POA: Diagnosis not present

## 2024-06-05 DIAGNOSIS — E1143 Type 2 diabetes mellitus with diabetic autonomic (poly)neuropathy: Secondary | ICD-10-CM | POA: Diagnosis not present

## 2024-06-05 DIAGNOSIS — J449 Chronic obstructive pulmonary disease, unspecified: Secondary | ICD-10-CM | POA: Diagnosis not present

## 2024-06-05 DIAGNOSIS — I10 Essential (primary) hypertension: Secondary | ICD-10-CM | POA: Diagnosis not present

## 2024-06-05 DIAGNOSIS — E119 Type 2 diabetes mellitus without complications: Secondary | ICD-10-CM | POA: Diagnosis not present

## 2024-06-08 ENCOUNTER — Telehealth: Payer: Self-pay | Admitting: Podiatry

## 2024-06-08 NOTE — Telephone Encounter (Signed)
 Patient calling to check on the status of her brace. Please call with an update. Thank you.

## 2024-06-30 DIAGNOSIS — Z1231 Encounter for screening mammogram for malignant neoplasm of breast: Secondary | ICD-10-CM | POA: Diagnosis not present

## 2024-07-05 ENCOUNTER — Ambulatory Visit (INDEPENDENT_AMBULATORY_CARE_PROVIDER_SITE_OTHER): Admitting: Podiatrist

## 2024-07-05 DIAGNOSIS — M76821 Posterior tibial tendinitis, right leg: Secondary | ICD-10-CM

## 2024-07-05 DIAGNOSIS — G8929 Other chronic pain: Secondary | ICD-10-CM

## 2024-07-05 DIAGNOSIS — M25571 Pain in right ankle and joints of right foot: Secondary | ICD-10-CM

## 2024-07-05 DIAGNOSIS — M25871 Other specified joint disorders, right ankle and foot: Secondary | ICD-10-CM

## 2024-07-05 NOTE — Progress Notes (Signed)
   SITUATION: Reason for Visit:         Fitting and Delivery of custom molded AFO-  Ankle foot orthosis  Patient Report:            Patient reports comfort and is satisfied with device.-  discussed she would likely need to purchase a larger pair of shoes to accomidate the width of the device.   OBJECTIVE DATA: Patient History / Diagnosis:    No change in pathology-pes planus/ posterior tibial tendon dysfunction right foot  Provided Device:                    Ankle foot orthoses   GOAL OF ORTHOSIS - Improve gait - Decrease energy expenditure - Improve Balance - Provide Triplanar stability of foot complex - Facilitate motion   ACTIONS PERFORMED Patient was fit with custom- ankle foot orthoses   Patient was provided with verbal and written instruction and demonstration regarding wear, care, proper fit, function, and use of the device.    Patient was also provided with verbal instruction regarding how to report any failures or malfunctions of the orthosis and necessary follow up care. Patient was also instructed to contact our office regarding any change in status that may affect the function of the orthosis.   Patient demonstrated understanding of all instructions.

## 2024-07-07 DIAGNOSIS — E039 Hypothyroidism, unspecified: Secondary | ICD-10-CM | POA: Diagnosis not present

## 2024-07-07 DIAGNOSIS — I10 Essential (primary) hypertension: Secondary | ICD-10-CM | POA: Diagnosis not present

## 2024-07-07 DIAGNOSIS — K5900 Constipation, unspecified: Secondary | ICD-10-CM | POA: Diagnosis not present

## 2024-07-07 DIAGNOSIS — M5416 Radiculopathy, lumbar region: Secondary | ICD-10-CM | POA: Diagnosis not present

## 2024-07-07 DIAGNOSIS — E1143 Type 2 diabetes mellitus with diabetic autonomic (poly)neuropathy: Secondary | ICD-10-CM | POA: Diagnosis not present

## 2024-07-07 DIAGNOSIS — R350 Frequency of micturition: Secondary | ICD-10-CM | POA: Diagnosis not present

## 2024-07-23 ENCOUNTER — Ambulatory Visit: Admitting: Podiatry

## 2024-08-06 ENCOUNTER — Ambulatory Visit (INDEPENDENT_AMBULATORY_CARE_PROVIDER_SITE_OTHER): Admitting: Podiatry

## 2024-08-06 ENCOUNTER — Encounter: Payer: Self-pay | Admitting: Podiatry

## 2024-08-06 DIAGNOSIS — E1142 Type 2 diabetes mellitus with diabetic polyneuropathy: Secondary | ICD-10-CM | POA: Diagnosis not present

## 2024-08-06 DIAGNOSIS — M79674 Pain in right toe(s): Secondary | ICD-10-CM | POA: Diagnosis not present

## 2024-08-06 DIAGNOSIS — B351 Tinea unguium: Secondary | ICD-10-CM

## 2024-08-06 DIAGNOSIS — M79675 Pain in left toe(s): Secondary | ICD-10-CM

## 2024-08-06 NOTE — Progress Notes (Signed)
 This patient returns to my office for at risk foot care.  This patient requires this care by a professional since this patient will be at risk due to having diabetes.  This patient is unable to cut nails herself since the patient cannot reach her nails.These nails are painful walking and wearing shoes.  This patient presents for at risk foot care today.  General Appearance  Alert, conversant and in no acute stress.  Vascular  Dorsalis pedis and posterior tibial  pulses are palpable  bilaterally.  Capillary return is within normal limits  bilaterally. Temperature is within normal limits  bilaterally.  Neurologic  Senn-Weinstein monofilament wire test diminished  bilaterally. Muscle power within normal limits bilaterally.  Nails Thick disfigured discolored nails with subungual debris  from hallux to fifth toes bilaterally. No evidence of bacterial infection or drainage bilaterally.  Orthopedic  No limitations of motion  feet .  No crepitus or effusions noted.  No bony pathology or digital deformities noted.  HAV B/L and pes planus.  Skin  normotropic skin with no porokeratosis noted bilaterally.  No signs of infections or ulcers noted.     Onychomycosis  Pain in right toes  Pain in left toes  Consent was obtained for treatment procedures.   Mechanical debridement of nails 1-5  bilaterally performed with a nail nipper.  Filed with dremel without incident.    Return office visit                     Told patient to return for periodic foot care and evaluation due to potential at risk complications.   Cordella Bold DPM

## 2024-12-03 ENCOUNTER — Ambulatory Visit: Admitting: Podiatry
